# Patient Record
Sex: Male | Born: 1950 | Race: White | Hispanic: No | Marital: Married | Smoking: Former smoker
Health system: Southern US, Community
[De-identification: ages and names within clinical notes are randomized; demographics above are authoritative.]

## PROBLEM LIST (undated history)

## (undated) DIAGNOSIS — T7840XA Allergy, unspecified, initial encounter: Secondary | ICD-10-CM

## (undated) DIAGNOSIS — I219 Acute myocardial infarction, unspecified: Secondary | ICD-10-CM

## (undated) DIAGNOSIS — Z87442 Personal history of urinary calculi: Secondary | ICD-10-CM

## (undated) DIAGNOSIS — R7301 Impaired fasting glucose: Secondary | ICD-10-CM

## (undated) DIAGNOSIS — B001 Herpesviral vesicular dermatitis: Secondary | ICD-10-CM

## (undated) DIAGNOSIS — N200 Calculus of kidney: Secondary | ICD-10-CM

## (undated) DIAGNOSIS — N529 Male erectile dysfunction, unspecified: Secondary | ICD-10-CM

## (undated) DIAGNOSIS — I10 Essential (primary) hypertension: Secondary | ICD-10-CM

## (undated) DIAGNOSIS — E785 Hyperlipidemia, unspecified: Secondary | ICD-10-CM

## (undated) DIAGNOSIS — B029 Zoster without complications: Secondary | ICD-10-CM

## (undated) DIAGNOSIS — H269 Unspecified cataract: Secondary | ICD-10-CM

## (undated) DIAGNOSIS — K219 Gastro-esophageal reflux disease without esophagitis: Secondary | ICD-10-CM

## (undated) DIAGNOSIS — M1990 Unspecified osteoarthritis, unspecified site: Secondary | ICD-10-CM

## (undated) DIAGNOSIS — H9190 Unspecified hearing loss, unspecified ear: Secondary | ICD-10-CM

## (undated) DIAGNOSIS — N182 Chronic kidney disease, stage 2 (mild): Secondary | ICD-10-CM

## (undated) HISTORY — DX: Personal history of urinary calculi: Z87.442

## (undated) HISTORY — PX: COLONOSCOPY: SHX174

## (undated) HISTORY — DX: Allergy, unspecified, initial encounter: T78.40XA

## (undated) HISTORY — DX: Essential (primary) hypertension: I10

## (undated) HISTORY — DX: Unspecified cataract: H26.9

## (undated) HISTORY — DX: Hyperlipidemia, unspecified: E78.5

## (undated) HISTORY — DX: Impaired fasting glucose: R73.01

## (undated) HISTORY — DX: Male erectile dysfunction, unspecified: N52.9

## (undated) HISTORY — DX: Herpesviral vesicular dermatitis: B00.1

## (undated) HISTORY — DX: Unspecified osteoarthritis, unspecified site: M19.90

## (undated) HISTORY — DX: Chronic kidney disease, stage 2 (mild): N18.2

## (undated) HISTORY — DX: Calculus of kidney: N20.0

## (undated) HISTORY — DX: Zoster without complications: B02.9

## (undated) HISTORY — DX: Unspecified hearing loss, unspecified ear: H91.90

## (undated) HISTORY — DX: Gastro-esophageal reflux disease without esophagitis: K21.9

## (undated) HISTORY — PX: LITHOTRIPSY: SUR834

## (undated) HISTORY — DX: Acute myocardial infarction, unspecified: I21.9

---

## 2009-06-13 ENCOUNTER — Emergency Department (HOSPITAL_BASED_OUTPATIENT_CLINIC_OR_DEPARTMENT_OTHER): Admission: EM | Admit: 2009-06-13 | Discharge: 2009-06-13 | Payer: Self-pay | Admitting: Emergency Medicine

## 2011-11-21 ENCOUNTER — Encounter: Payer: Self-pay | Admitting: Internal Medicine

## 2011-11-21 ENCOUNTER — Telehealth: Payer: Self-pay | Admitting: Internal Medicine

## 2011-11-21 ENCOUNTER — Ambulatory Visit (INDEPENDENT_AMBULATORY_CARE_PROVIDER_SITE_OTHER): Payer: PRIVATE HEALTH INSURANCE | Admitting: Internal Medicine

## 2011-11-21 DIAGNOSIS — N200 Calculus of kidney: Secondary | ICD-10-CM

## 2011-11-21 DIAGNOSIS — I10 Essential (primary) hypertension: Secondary | ICD-10-CM

## 2011-11-21 DIAGNOSIS — R635 Abnormal weight gain: Secondary | ICD-10-CM

## 2011-11-21 DIAGNOSIS — H6120 Impacted cerumen, unspecified ear: Secondary | ICD-10-CM | POA: Insufficient documentation

## 2011-11-21 DIAGNOSIS — E785 Hyperlipidemia, unspecified: Secondary | ICD-10-CM

## 2011-11-21 DIAGNOSIS — Z79899 Other long term (current) drug therapy: Secondary | ICD-10-CM

## 2011-11-21 HISTORY — DX: Essential (primary) hypertension: I10

## 2011-11-21 MED ORDER — ATENOLOL-CHLORTHALIDONE 50-25 MG PO TABS: 1.0000 | ORAL_TABLET | Freq: Every day | ORAL | Status: DC

## 2011-11-21 NOTE — Patient Instructions (Signed)
Please schedule lipid/lft 272.4, tsh (wt gain), cbc 401.9 and chem7 v58.69 prior to next visit

## 2011-11-21 NOTE — Progress Notes (Signed)
Subjective:    Patient ID: Leroy Proctor, male    DOB: 05-21-1951, 60 y.o.   MRN: [id number]  HPI Pt presents to clinic to establish care and for follow up of multiple medical problems. H/o HTN currently well controlled since change to tenorectic. No side effects from medication. Remote h/o kidney stones without pain/attack for many years. Takes zocor 40mg  qd and has improved chol with diet/exercise. Also notes onset of myalgias often after exercise that began after starting the zocor. Is interested in possible dc of medication. Myalgias not typically at rest but occur with exercise that did not previously cause the pain. No known h/o abn lft's. Notes decreased hearing chronically. No other complaints.  Past Medical History  Diagnosis Date  . Hypertension   . Hyperlipidemia    No past surgical history on file.  reports that he has quit smoking. He has never used smokeless tobacco. He reports that he drinks alcohol. He reports that he does not use illicit drugs. family history is not on file. Allergies  Allergen Reactions  . Demerol     vomiting     Review of Systems  HENT: Positive for hearing loss.   Musculoskeletal: Positive for myalgias.  All other systems reviewed and are negative.       Objective:   Physical Exam  Nursing note and vitals reviewed. Constitutional: He appears well-developed and well-nourished. No distress.  HENT:  Head: Normocephalic and atraumatic.  Right Ear: External ear normal.  Left Ear: External ear normal.  Nose: Nose normal.  Mouth/Throat: Oropharynx is clear and moist. No oropharyngeal exudate.       Bilateral cerumen impaction  Eyes: Conjunctivae are normal. Right eye exhibits no discharge. Left eye exhibits no discharge. No scleral icterus.  Neck: Neck supple. Carotid bruit is not present.  Cardiovascular: Normal rate and regular rhythm.  Exam reveals no gallop and no friction rub.   No murmur heard. Pulmonary/Chest: Effort normal and breath  sounds normal. No respiratory distress. He has no wheezes. He has no rales.  Neurological: He is alert.  Skin: Skin is warm and dry. He is not diaphoretic.  Psychiatric: He has a normal mood and affect.    Physical Exam  Nursing note and vitals reviewed. Constitutional: Appears well-developed and well-nourished. No distress.  HENT:  Head: Normocephalic and atraumatic.  Right Ear: External ear normal.  Left Ear: External ear normal.  Eyes: Conjunctivae are normal. No scleral icterus.  Neck: Neck supple. Carotid bruit is not present.  Cardiovascular: Normal rate, regular rhythm and normal heart sounds.  Exam reveals no gallop and no friction rub.   No murmur heard. Pulmonary/Chest: Effort normal and breath sounds normal. No respiratory distress. He has no wheezes. no rales.  Lymphadenopathy:    He has no cervical adenopathy.  Neurological:Alert.  Skin: Skin is warm and dry. Not diaphoretic.  Psychiatric: Has a normal mood and affect.        Assessment & Plan:

## 2011-11-21 NOTE — Telephone Encounter (Signed)
Lab orders entered for March of 2013. 

## 2011-11-21 NOTE — Assessment & Plan Note (Signed)
Normotensive and stable. Continue current regimen. Monitor bp as outpt and followup in clinic as scheduled. Obtain cbc and chem7 prior to next visit

## 2011-11-21 NOTE — Assessment & Plan Note (Signed)
Stop zocor. ?related myalgias. Focus on low fat diet and regular exercise. Repeat lipid/lft prior to next visit. If suboptimal consider alternative statin.

## 2011-11-21 NOTE — Assessment & Plan Note (Signed)
Attempt bilateral irrigation. 

## 2011-12-16 ENCOUNTER — Ambulatory Visit (INDEPENDENT_AMBULATORY_CARE_PROVIDER_SITE_OTHER): Payer: PRIVATE HEALTH INSURANCE | Admitting: Internal Medicine

## 2011-12-16 DIAGNOSIS — Z23 Encounter for immunization: Secondary | ICD-10-CM

## 2011-12-17 NOTE — Progress Notes (Signed)
Patient ID: Leroy Proctor, male   DOB: 24-Jan-1951, 61 y.o.   MRN: [id number] .

## 2012-02-14 ENCOUNTER — Other Ambulatory Visit: Payer: Self-pay | Admitting: *Deleted

## 2012-02-14 DIAGNOSIS — R635 Abnormal weight gain: Secondary | ICD-10-CM

## 2012-02-14 DIAGNOSIS — E785 Hyperlipidemia, unspecified: Secondary | ICD-10-CM

## 2012-02-14 DIAGNOSIS — Z79899 Other long term (current) drug therapy: Secondary | ICD-10-CM

## 2012-02-14 DIAGNOSIS — I10 Essential (primary) hypertension: Secondary | ICD-10-CM

## 2012-02-15 LAB — CBC
HCT: 42.3 % (ref 39.0–52.0)
Hemoglobin: 13.9 g/dL (ref 13.0–17.0)
MCH: 28.3 pg (ref 26.0–34.0)
MCHC: 32.9 g/dL (ref 30.0–36.0)
MCV: 86.2 fL (ref 78.0–100.0)
RDW: 12.5 % (ref 11.5–15.5)

## 2012-02-15 LAB — LIPID PANEL
Cholesterol: 241 mg/dL — ABNORMAL HIGH (ref 0–200)
Triglycerides: 99 mg/dL (ref <150)
VLDL: 20 mg/dL (ref 0–40)

## 2012-02-15 LAB — BASIC METABOLIC PANEL
BUN: 23 mg/dL (ref 6–23)
Calcium: 9.2 mg/dL (ref 8.4–10.5)
Creat: 1.09 mg/dL (ref 0.50–1.35)
Glucose, Bld: 114 mg/dL — ABNORMAL HIGH (ref 70–99)

## 2012-02-15 LAB — HEPATIC FUNCTION PANEL
ALT: 18 U/L (ref 0–53)
AST: 18 U/L (ref 0–37)
Bilirubin, Direct: 0.1 mg/dL (ref 0.0–0.3)

## 2012-02-15 LAB — TSH: TSH: 1.078 u[IU]/mL (ref 0.350–4.500)

## 2012-02-21 ENCOUNTER — Ambulatory Visit: Payer: PRIVATE HEALTH INSURANCE | Admitting: Internal Medicine

## 2012-02-23 ENCOUNTER — Encounter: Payer: Self-pay | Admitting: Internal Medicine

## 2012-02-23 ENCOUNTER — Telehealth: Payer: Self-pay | Admitting: Internal Medicine

## 2012-02-23 ENCOUNTER — Ambulatory Visit (INDEPENDENT_AMBULATORY_CARE_PROVIDER_SITE_OTHER): Payer: PRIVATE HEALTH INSURANCE | Admitting: Internal Medicine

## 2012-02-23 VITALS — BP 120/70 | HR 43 | Temp 97.5°F | Resp 16 | Wt 197.0 lb

## 2012-02-23 DIAGNOSIS — M7660 Achilles tendinitis, unspecified leg: Secondary | ICD-10-CM

## 2012-02-23 DIAGNOSIS — R739 Hyperglycemia, unspecified: Secondary | ICD-10-CM

## 2012-02-23 DIAGNOSIS — E785 Hyperlipidemia, unspecified: Secondary | ICD-10-CM

## 2012-02-23 DIAGNOSIS — R7309 Other abnormal glucose: Secondary | ICD-10-CM

## 2012-02-23 DIAGNOSIS — I10 Essential (primary) hypertension: Secondary | ICD-10-CM

## 2012-02-23 DIAGNOSIS — Z79899 Other long term (current) drug therapy: Secondary | ICD-10-CM

## 2012-02-23 MED ORDER — ATORVASTATIN CALCIUM 20 MG PO TABS: 20.0000 mg | ORAL_TABLET | Freq: Every day | ORAL | Status: DC

## 2012-02-23 NOTE — Patient Instructions (Signed)
Please schedule chem7, a1c-hyperglycemia and lipid/lft 272.4 in ~6 wks Also schedule chem7-v58.69 and lipid/lft 272.4 prior to next visit

## 2012-02-23 NOTE — Telephone Encounter (Signed)
Future lab orders placed for the weeks of 04/02/12 and 08/20/12 and copies forwarded to the lab.

## 2012-02-23 NOTE — Progress Notes (Signed)
Subjective:    Patient ID: Leroy Proctor, male    DOB: 1951-08-21, 61 y.o.   MRN: [id number]  HPI Pt presents to clinic for followup of multiple medical problems. BP reviewed normotensive. Remains off zocor with no change in leg discomfort. Reviewed elevated ldl. Specifies sometimes notes pain located along achilles tendon but with FROM and no injury/trauma. Not currently taking medication for the problem.  Past Medical History  Diagnosis Date  . Hypertension   . Hyperlipidemia    No past surgical history on file.  reports that he has quit smoking. He has never used smokeless tobacco. He reports that he drinks alcohol. He reports that he does not use illicit drugs. family history is not on file. Allergies  Allergen Reactions  . Demerol     vomiting      Review of Systems see hpi     Objective:   Physical Exam  Physical Exam  Nursing note and vitals reviewed. Constitutional: Appears well-developed and well-nourished. No distress.  HENT:  Head: Normocephalic and atraumatic.  Right Ear: External ear normal.  Left Ear: External ear normal.  Eyes: Conjunctivae are normal. No scleral icterus.  Neck: Neck supple. Carotid bruit is not present.  Cardiovascular: Normal rate, regular rhythm and normal heart sounds.  Exam reveals no gallop and no friction rub.   No murmur heard. Pulmonary/Chest: Effort normal and breath sounds normal. No respiratory distress. He has no wheezes. no rales.  Lymphadenopathy:    He has no cervical adenopathy.  Neurological:Alert.  Skin: Skin is warm and dry. Not diaphoretic.  Psychiatric: Has a normal mood and affect.  MSK: FROM bilateral achilles tendon. NT. Gait nl       Assessment & Plan:

## 2012-02-26 DIAGNOSIS — M7660 Achilles tendinitis, unspecified leg: Secondary | ICD-10-CM | POA: Insufficient documentation

## 2012-02-26 NOTE — Assessment & Plan Note (Signed)
Normotensive and stable. Continue current regimen. Monitor bp as outpt and followup in clinic as scheduled.  

## 2012-02-26 NOTE — Assessment & Plan Note (Signed)
Off zocor. Attempt lipitor 20mg  qd. Obtain lipid/lft with prior to next visit

## 2012-02-26 NOTE — Assessment & Plan Note (Signed)
Discussed voltaren prn and currently defers. Followup if no improvement or worsening.

## 2012-07-01 ENCOUNTER — Other Ambulatory Visit: Payer: Self-pay | Admitting: Internal Medicine

## 2012-08-27 ENCOUNTER — Ambulatory Visit (INDEPENDENT_AMBULATORY_CARE_PROVIDER_SITE_OTHER): Payer: PRIVATE HEALTH INSURANCE | Admitting: Internal Medicine

## 2012-08-27 ENCOUNTER — Encounter: Payer: Self-pay | Admitting: Internal Medicine

## 2012-08-27 VITALS — BP 118/72 | HR 46 | Temp 97.8°F | Resp 16 | Ht 68.0 in | Wt 195.5 lb

## 2012-08-27 DIAGNOSIS — N529 Male erectile dysfunction, unspecified: Secondary | ICD-10-CM | POA: Insufficient documentation

## 2012-08-27 DIAGNOSIS — E785 Hyperlipidemia, unspecified: Secondary | ICD-10-CM

## 2012-08-27 DIAGNOSIS — I10 Essential (primary) hypertension: Secondary | ICD-10-CM

## 2012-08-27 DIAGNOSIS — R739 Hyperglycemia, unspecified: Secondary | ICD-10-CM

## 2012-08-27 DIAGNOSIS — R7309 Other abnormal glucose: Secondary | ICD-10-CM

## 2012-08-27 DIAGNOSIS — Z79899 Other long term (current) drug therapy: Secondary | ICD-10-CM

## 2012-08-27 MED ORDER — VARDENAFIL HCL 20 MG PO TABS: 20.0000 mg | ORAL_TABLET | Freq: Every day | ORAL | Status: DC | PRN

## 2012-08-27 MED ORDER — ATENOLOL-CHLORTHALIDONE 50-25 MG PO TABS: 1.0000 | ORAL_TABLET | Freq: Every day | ORAL | Status: DC

## 2012-08-27 MED ORDER — ATORVASTATIN CALCIUM 20 MG PO TABS: 20.0000 mg | ORAL_TABLET | Freq: Every day | ORAL | Status: DC

## 2012-08-27 NOTE — Assessment & Plan Note (Signed)
Obtain fasting lipid profile and liver function tests. 

## 2012-08-27 NOTE — Assessment & Plan Note (Signed)
Normotensive and stable. Continue current regimen. Monitor bp as outpt and followup in clinic as scheduled. Obtain Chem-7

## 2012-08-27 NOTE — Patient Instructions (Signed)
Please schedule fasting labs prior to next visit Cbc-401.9, chem7-v58.69 and lipid/lft-272.4 

## 2012-08-27 NOTE — Progress Notes (Signed)
Subjective:    Patient ID: Leroy Proctor, male    DOB: [birth year], 61 y.o.   MRN: [id number]  HPI Pt presents to clinic for followup of multiple medical problems. Complains erectile dysfunction with intact libido. Viagra previously caused headache. Not currently fasting for labs. Blood pressure reviewed is normotensive in weight down 2 pounds since last visit.  Past Medical History  Diagnosis Date  . Hypertension   . Hyperlipidemia    No past surgical history on file.  reports that he has quit smoking. He has never used smokeless tobacco. He reports that he drinks alcohol. He reports that he does not use illicit drugs. family history is not on file. Allergies  Allergen Reactions  . Demerol     vomiting      Review of Systems see hpi     Objective:   Physical Exam  Physical Exam  Nursing note and vitals reviewed. Constitutional: Appears well-developed and well-nourished. No distress.  HENT:  Head: Normocephalic and atraumatic.  Right Ear: External ear normal.  Left Ear: External ear normal.  Eyes: Conjunctivae are normal. No scleral icterus.  Neck: Neck supple. Carotid bruit is not present.  Cardiovascular: Normal rate, regular rhythm and normal heart sounds.  Exam reveals no gallop and no friction rub.   No murmur heard. Pulmonary/Chest: Effort normal and breath sounds normal. No respiratory distress. He has no wheezes. no rales.  Lymphadenopathy:    He has no cervical adenopathy.  Neurological:Alert.  Skin: Skin is warm and dry. Not diaphoretic.  Psychiatric: Has a normal mood and affect.        Assessment & Plan:

## 2012-08-27 NOTE — Assessment & Plan Note (Signed)
Obtain early a.m. testosterone. Attempt Levitra when necessary.

## 2012-09-03 LAB — LIPID PANEL
Cholesterol: 159 mg/dL (ref 0–200)
HDL: 38 mg/dL — ABNORMAL LOW (ref >39)
Total CHOL/HDL Ratio: 4.2 Ratio
Triglycerides: 81 mg/dL (ref <150)

## 2012-09-03 LAB — HEMOGLOBIN A1C: Hgb A1c MFr Bld: 5.8 % — ABNORMAL HIGH (ref <5.7)

## 2012-09-03 LAB — HEPATIC FUNCTION PANEL
ALT: 18 U/L (ref 0–53)
AST: 20 U/L (ref 0–37)
Albumin: 3.9 g/dL (ref 3.5–5.2)
Total Protein: 6.8 g/dL (ref 6.0–8.3)

## 2012-09-03 LAB — BASIC METABOLIC PANEL
Calcium: 8.9 mg/dL (ref 8.4–10.5)
Sodium: 138 mEq/L (ref 135–145)

## 2012-09-03 NOTE — Addendum Note (Signed)
Addended by: Regis Bill on: 09/03/2012 10:04 AM   Modules accepted: Orders

## 2012-09-03 NOTE — Addendum Note (Signed)
Addended by: Regis Bill on: 09/03/2012 10:05 AM   Modules accepted: Orders

## 2012-12-10 ENCOUNTER — Ambulatory Visit (INDEPENDENT_AMBULATORY_CARE_PROVIDER_SITE_OTHER): Payer: PRIVATE HEALTH INSURANCE | Admitting: Family

## 2012-12-10 ENCOUNTER — Encounter: Payer: Self-pay | Admitting: Family

## 2012-12-10 VITALS — BP 114/78 | HR 47 | Temp 97.7°F | Resp 16 | Ht 68.0 in | Wt 201.1 lb

## 2012-12-10 DIAGNOSIS — J40 Bronchitis, not specified as acute or chronic: Secondary | ICD-10-CM

## 2012-12-10 MED ORDER — HYDROCOD POLST-CHLORPHEN POLST 10-8 MG/5ML PO LQCR: 5.0000 mL | Freq: Every evening | ORAL | Status: DC | PRN

## 2012-12-10 MED ORDER — AZITHROMYCIN 250 MG PO TABS: ORAL_TABLET | ORAL | Status: DC

## 2012-12-10 NOTE — Assessment & Plan Note (Signed)
Will rx with zithromax and add tussionex HS to help with HS cough.  Pt is instructed to call if symptoms worsen or if symptoms do not continue to improve.

## 2012-12-10 NOTE — Progress Notes (Signed)
Subjective:    Patient ID: Leroy Proctor, male    DOB: 1951-05-07, 62 y.o.   MRN: [id number]  HPI  Ms.  Proctor is a 62 yr old male who presents today with chief complaint of cough. Pt reports that his cough started on 12/31.  Developed "head cold."  Has had persistent quit cough which "tore me up."  Reports that his cough continued.  Cough is worse with laying down.  Last night he "coughed all night and did not sleep."  He denies known fever.   Has "felt feverish."  Has tried some OTC preps for congestion which did not help.    Review of Systems    see HPI  Past Medical History  Diagnosis Date  . Hypertension   . Hyperlipidemia     History   Social History  . Marital Status: Married    Spouse Name: N/A    Number of Children: N/A  . Years of Education: N/A   Occupational History  . Not on file.   Social History Main Topics  . Smoking status: Former Games developer  . Smokeless tobacco: Never Used  . Alcohol Use: Yes  . Drug Use: No  . Sexually Active: Not on file   Other Topics Concern  . Not on file   Social History Narrative  . No narrative on file    No past surgical history on file.  No family history on file.  Allergies  Allergen Reactions  . Demerol     vomiting    Current Outpatient Prescriptions on File Prior to Visit  Medication Sig Dispense Refill  . atenolol-chlorthalidone (TENORETIC) 50-25 MG per tablet Take 1 tablet by mouth daily.  30 tablet  6  . atorvastatin (LIPITOR) 20 MG tablet Take 1 tablet (20 mg total) by mouth daily.  30 tablet  6  . vardenafil (LEVITRA) 20 MG tablet Take 1 tablet (20 mg total) by mouth daily as needed for erectile dysfunction.  10 tablet  3    BP 114/78  Pulse 47  Temp 97.7 F (36.5 C) (Oral)  Resp 16  Ht 5\' 8"  (1.727 m)  Wt 201 lb 1.9 oz (91.227 kg)  BMI 30.58 kg/m2  SpO2 97%    Objective:   Physical Exam  Constitutional: He is oriented to person, place, and time. He appears well-developed and well-nourished.  No distress.  HENT:  Head: Normocephalic and atraumatic.  Right Ear: Tympanic membrane and ear canal normal.  Left Ear: Tympanic membrane and ear canal normal.  Mouth/Throat: No posterior oropharyngeal edema or posterior oropharyngeal erythema.  Cardiovascular: Normal rate and regular rhythm.   No murmur heard. Pulmonary/Chest: Effort normal and breath sounds normal. No respiratory distress. He has no wheezes. He has no rales. He exhibits no tenderness.  Musculoskeletal: He exhibits no edema.  Neurological: He is alert and oriented to person, place, and time.  Skin: Skin is warm.  Psychiatric: He has a normal mood and affect. His behavior is normal. Judgment and thought content normal.          Assessment & Plan:

## 2012-12-10 NOTE — Patient Instructions (Addendum)
Please call if symptoms worsen or if not improved in 2-3 days.   

## 2013-04-03 ENCOUNTER — Telehealth: Payer: Self-pay | Admitting: Internal Medicine

## 2013-04-03 MED ORDER — ATORVASTATIN CALCIUM 20 MG PO TABS: 20.0000 mg | ORAL_TABLET | Freq: Every day | ORAL | Status: DC

## 2013-04-03 NOTE — Telephone Encounter (Signed)
Refill- atorvastatin calcium 20mg  tab. Take one tablet by mouth daily. Qty 30 last fill 4.18.14

## 2013-04-07 ENCOUNTER — Other Ambulatory Visit: Payer: Self-pay | Admitting: Internal Medicine

## 2013-04-08 ENCOUNTER — Telehealth: Payer: Self-pay

## 2013-04-08 NOTE — Telephone Encounter (Signed)
Rx sent in to pharmacy. 

## 2013-04-08 NOTE — Telephone Encounter (Signed)
Pt called stating he needs his BP medication refilled. I see that it was already done today but I also called and left the RX on [employer] vm due to it saying they were on lunch until 2:30 pm.   I informed pt

## 2013-06-26 ENCOUNTER — Telehealth: Payer: Self-pay

## 2013-06-26 NOTE — Telephone Encounter (Signed)
pts wife Okey Regal called stating that her spouse came up to pick up his records and on his visit from 12-10-12 it has all the wifes information instead of the patients?   pts wife was concerned because the visit summary says Ms and male .

## 2013-07-02 NOTE — Telephone Encounter (Signed)
I left a message for spouse to return my call to see if there were any other concerns but I haven't got a call back.

## 2013-07-03 NOTE — Telephone Encounter (Addendum)
Please let pt's wife know that I reviewed his chart and the notation of demerol allergy dates back to a 2010 ER visit.  I would prefer that we try to avoid future use of demerol to be on the safe side.  Also, "ms" was a typo- my apologies.

## 2013-07-03 NOTE — Telephone Encounter (Signed)
Spoke with pt's wife re: concern. She states pt's chart has documentation of allergy to Demerol and she is not aware that pt has ever taken Demerol before however she has a documented demerol allergy. She is also concerned that pt's office visit from 12/2012 references pt as "Ms". Upon review of note it appears that may be a typo as the remainer of the paragraph refers to pt as "he/him/his".   I advised pt's wife of this but she remains concerned.  She did verify that pt was seen for a cough in January.  She asks that I remove Demerol from pt's allergies. I advised her that I am unable to do that without confirming / clarifying the allergy with the patient first and she became upset that I "am asking the pt to correct something that we made a mistake on".  I advised her that we ask and give patients a printout of their medication and allergy list at each visit to verify and update this information and he has not reported this prior.  This allergy was added to the pt's record in 2012 when he established with this practice. She states she will have him discuss this when he comes in to be seen again. She also wanted to let us know that pt is hard of hearing and only hears about 50% of what is said to him.

## 2013-07-03 NOTE — Telephone Encounter (Signed)
Will discuss with pt at his next appt

## 2013-09-16 ENCOUNTER — Telehealth: Payer: Self-pay | Admitting: Internal Medicine

## 2013-09-16 NOTE — Telephone Encounter (Signed)
I am happy to arrange colonoscopy.  We generally order this as part of a physical.  I see is has not had a recent physical. Please arrange cpx.

## 2013-09-16 NOTE — Telephone Encounter (Signed)
Patient states that his insurance is requesting that he have a screening colonoscopy and would like to be referred to this.

## 2013-09-18 NOTE — Telephone Encounter (Signed)
Left message on voicemail to return my call.  

## 2013-09-20 NOTE — Telephone Encounter (Signed)
Left message to return my call.  

## 2013-09-23 NOTE — Telephone Encounter (Signed)
Letter mailed to pt.  

## 2013-09-24 ENCOUNTER — Telehealth: Payer: Self-pay | Admitting: Family

## 2013-09-24 NOTE — Telephone Encounter (Signed)
His insurance is requiring him to have a colonoscopy before the first of next year.

## 2013-09-25 NOTE — Telephone Encounter (Signed)
Marj,   See 10/13 phone note.  I left 3 messages for pt and never received call back so I finally mailed him a letter. If you can reach him, he needs to schedule a physical first (as colonoscopy is usually arranged at the time of his physical) and Melissa can arrange referral to GI at that time. Thanks.

## 2013-10-02 ENCOUNTER — Encounter: Payer: Self-pay | Admitting: Family

## 2013-10-02 ENCOUNTER — Ambulatory Visit (INDEPENDENT_AMBULATORY_CARE_PROVIDER_SITE_OTHER): Payer: PRIVATE HEALTH INSURANCE | Admitting: Family

## 2013-10-02 VITALS — BP 140/82 | HR 48 | Temp 97.7°F | Resp 16 | Ht 69.0 in | Wt 194.0 lb

## 2013-10-02 DIAGNOSIS — Z136 Encounter for screening for cardiovascular disorders: Secondary | ICD-10-CM

## 2013-10-02 DIAGNOSIS — I498 Other specified cardiac arrhythmias: Secondary | ICD-10-CM

## 2013-10-02 DIAGNOSIS — R001 Bradycardia, unspecified: Secondary | ICD-10-CM

## 2013-10-02 DIAGNOSIS — Z0000 Encounter for general adult medical examination without abnormal findings: Secondary | ICD-10-CM

## 2013-10-02 DIAGNOSIS — Z23 Encounter for immunization: Secondary | ICD-10-CM

## 2013-10-02 HISTORY — DX: Bradycardia, unspecified: R00.1

## 2013-10-02 HISTORY — DX: Encounter for general adult medical examination without abnormal findings: Z00.00

## 2013-10-02 MED ORDER — AMLODIPINE BESYLATE 5 MG PO TABS: 5.0000 mg | ORAL_TABLET | Freq: Every day | ORAL | Status: DC

## 2013-10-02 MED ORDER — ATENOLOL-CHLORTHALIDONE 50-25 MG PO TABS: 1.0000 | ORAL_TABLET | Freq: Every day | ORAL | Status: DC

## 2013-10-02 MED ORDER — ATORVASTATIN CALCIUM 20 MG PO TABS: 20.0000 mg | ORAL_TABLET | Freq: Every day | ORAL | Status: DC

## 2013-10-02 MED ORDER — CHLORTHALIDONE 25 MG PO TABS: 25.0000 mg | ORAL_TABLET | Freq: Every day | ORAL | Status: DC

## 2013-10-02 NOTE — Assessment & Plan Note (Addendum)
Continue healthy diet, exercise.  Pt will return fasting for lab work including PSA (discussed pros/cons).

## 2013-10-02 NOTE — Patient Instructions (Addendum)
Please return fasting for lab work. You will be contacted about your referral to gastroenterology for colonoscopy. Stop Tenoretic due to low heart rate.   Start chlorthalidone and amlodipine. Follow up in 1 month for BP and heart rate check.

## 2013-10-02 NOTE — Progress Notes (Signed)
Subjective:    Patient ID: Leroy Proctor, male    DOB: [birth year], 62 y.o.   MRN: [id number]  HPI  Patient presents today for complete physical.  Immunizations: Tdap up to date.  Would like flu shot  Diet: Reports diet is healthy.  Avoids red meat and fried food.  Exercise: enjoys [employer] 5 x a week for 1 hour.  39miles/week stationary bike, rowing machine twisting etc.   Colonoscopy: due EKG: due    Review of Systems  Constitutional: Negative for unexpected weight change.  HENT: Positive for rhinorrhea.        Reports problems with his hearing "his whole life."  Declines referral due to finances  Respiratory: Negative for cough.   Gastrointestinal: Negative for vomiting, diarrhea and constipation.  Genitourinary: Negative for frequency.       Nocturia  X1.   Musculoskeletal: Negative for arthralgias and myalgias.  Skin:       Occasional dry skin  Neurological: Negative for headaches.  Hematological: Negative for adenopathy.  Psychiatric/Behavioral:       Denies depression/anxiety       Objective:   Physical Exam  Physical Exam  Constitutional: He is oriented to person, place, and time. He appears well-developed and well-nourished. No distress.  HENT:  Head: Normocephalic and atraumatic.  Right Ear: Tympanic membrane and ear canal normal.  Left Ear: Tympanic membrane and ear canal normal.  Mouth/Throat: Oropharynx is clear and moist.  Eyes: Pupils are equal, round, and reactive to light. No scleral icterus.  Neck: Normal range of motion. No thyromegaly present.  Cardiovascular: Normal rate and regular rhythm.   No murmur heard. Pulmonary/Chest: Effort normal and breath sounds normal. No respiratory distress. He has no wheezes. He has no rales. He exhibits no tenderness.  Abdominal: Soft. Bowel sounds are normal. He exhibits no distension and no mass. There is no tenderness. There is no rebound and no guarding.  Musculoskeletal: He exhibits no edema.  Lymphadenopathy:    He has no cervical adenopathy.  Neurological: He is alert and oriented to person, place, and time.  He exhibits normal muscle tone. Coordination normal.  Skin: Skin is warm and dry.  Psychiatric: He has a normal mood and affect. His behavior is normal. Judgment and thought content normal.          Assessment & Plan:         Assessment & Plan:

## 2013-10-02 NOTE — Assessment & Plan Note (Signed)
BP is stable and pt is asymptomatic, however his EKG performed in the office today notes HR of 38.  Earlier pulse was 48.  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  Will d/c pt's beta blocker and instead change to amlodipine. Continue chlorthalidone.  Follow up in 1 month for BP check, EKG and HR.  Pt verbalizes understanding.

## 2013-10-11 ENCOUNTER — Encounter: Payer: Self-pay | Admitting: Internal Medicine

## 2013-10-11 ENCOUNTER — Other Ambulatory Visit: Payer: Self-pay | Admitting: Family

## 2013-10-11 LAB — CBC WITH DIFFERENTIAL/PLATELET
Eosinophils Absolute: 0.2 10*3/uL (ref 0.0–0.7)
HCT: 43.1 % (ref 39.0–52.0)
Hemoglobin: 15 g/dL (ref 13.0–17.0)
Lymphocytes Relative: 25 % (ref 12–46)
Lymphs Abs: 1.4 10*3/uL (ref 0.7–4.0)
MCHC: 34.8 g/dL (ref 30.0–36.0)
Monocytes Relative: 10 % (ref 3–12)
Neutro Abs: 3.3 10*3/uL (ref 1.7–7.7)
Neutrophils Relative %: 60 % (ref 43–77)
Platelets: 272 10*3/uL (ref 150–400)
RBC: 5.06 MIL/uL (ref 4.22–5.81)
WBC: 5.5 10*3/uL (ref 4.0–10.5)

## 2013-10-11 LAB — LIPID PANEL
Cholesterol: 163 mg/dL (ref 0–200)
LDL Cholesterol: 110 mg/dL — ABNORMAL HIGH (ref 0–99)
Triglycerides: 54 mg/dL (ref <150)
VLDL: 11 mg/dL (ref 0–40)

## 2013-10-11 LAB — TSH: TSH: 1.083 u[IU]/mL (ref 0.350–4.500)

## 2013-10-11 LAB — BASIC METABOLIC PANEL WITH GFR
BUN: 24 mg/dL — ABNORMAL HIGH (ref 6–23)
CO2: 29 mEq/L (ref 19–32)
Calcium: 9.1 mg/dL (ref 8.4–10.5)
Chloride: 101 mEq/L (ref 96–112)
GFR, Est African American: 82 mL/min
Glucose, Bld: 98 mg/dL (ref 70–99)
Potassium: 4.2 mEq/L (ref 3.5–5.3)
Sodium: 134 mEq/L — ABNORMAL LOW (ref 135–145)

## 2013-10-11 LAB — HEPATIC FUNCTION PANEL
ALT: 23 U/L (ref 0–53)
AST: 23 U/L (ref 0–37)
Albumin: 4 g/dL (ref 3.5–5.2)
Alkaline Phosphatase: 58 U/L (ref 39–117)
Bilirubin, Direct: 0.1 mg/dL (ref 0.0–0.3)
Indirect Bilirubin: 0.5 mg/dL (ref 0.0–0.9)
Total Bilirubin: 0.6 mg/dL (ref 0.3–1.2)
Total Protein: 7.2 g/dL (ref 6.0–8.3)

## 2013-10-12 LAB — URINALYSIS, ROUTINE W REFLEX MICROSCOPIC
Glucose, UA: NEGATIVE mg/dL
Hgb urine dipstick: NEGATIVE
Leukocytes, UA: NEGATIVE
Nitrite: NEGATIVE
Urobilinogen, UA: 0.2 mg/dL (ref 0.0–1.0)
pH: 7 (ref 5.0–8.0)

## 2013-10-12 LAB — PSA: PSA: 0.33 ng/mL (ref <=4.00)

## 2013-10-14 ENCOUNTER — Encounter: Payer: Self-pay | Admitting: Family

## 2013-11-11 ENCOUNTER — Encounter: Payer: Self-pay | Admitting: Internal Medicine

## 2013-11-11 ENCOUNTER — Ambulatory Visit (AMBULATORY_SURGERY_CENTER): Payer: Self-pay | Admitting: *Deleted

## 2013-11-11 VITALS — Ht 69.0 in | Wt 203.0 lb

## 2013-11-11 DIAGNOSIS — Z1211 Encounter for screening for malignant neoplasm of colon: Secondary | ICD-10-CM

## 2013-11-11 MED ORDER — NA SULFATE-K SULFATE-MG SULF 17.5-3.13-1.6 GM/177ML PO SOLN: ORAL | Status: DC

## 2013-11-11 NOTE — Progress Notes (Signed)
Patient denies any allergies to eggs or soy. 

## 2013-11-21 ENCOUNTER — Ambulatory Visit (AMBULATORY_SURGERY_CENTER): Payer: PRIVATE HEALTH INSURANCE | Admitting: Internal Medicine

## 2013-11-21 ENCOUNTER — Encounter: Payer: Self-pay | Admitting: Internal Medicine

## 2013-11-21 VITALS — BP 134/75 | HR 54 | Temp 97.1°F | Resp 15 | Ht 69.0 in | Wt 203.0 lb

## 2013-11-21 DIAGNOSIS — D126 Benign neoplasm of colon, unspecified: Secondary | ICD-10-CM

## 2013-11-21 DIAGNOSIS — Z1211 Encounter for screening for malignant neoplasm of colon: Secondary | ICD-10-CM

## 2013-11-21 DIAGNOSIS — D128 Benign neoplasm of rectum: Secondary | ICD-10-CM

## 2013-11-21 MED ORDER — SODIUM CHLORIDE 0.9 % IV SOLN: 500.0000 mL | INTRAVENOUS | Status: DC

## 2013-11-21 NOTE — Progress Notes (Signed)
Lidocaine-40mg IV prior to Propofol InductionPropofol given over incremental dosages 

## 2013-11-21 NOTE — Patient Instructions (Addendum)
I found and removed 2 tiny polyps - otherwise ok.  I will let you know pathology results and when to have another routine colonoscopy by mail. I appreciate the opportunity to care for you. Iva Boop, MD, FACG  YOU HAD AN ENDOSCOPIC PROCEDURE TODAY AT THE [facility]: Refer to the procedure report that was given to you for any specific questions about what was found during the examination.  If the procedure report does not answer your questions, please call your gastroenterologist to clarify.  If you requested that your care partner not be given the details of your procedure findings, then the procedure report has been included in a sealed envelope for you to review at your convenience later.  YOU SHOULD EXPECT: Some feelings of bloating in the abdomen. Passage of more gas than usual.  Walking can help get rid of the air that was put into your GI tract during the procedure and reduce the bloating. If you had a lower endoscopy (such as a colonoscopy or flexible sigmoidoscopy) you may notice spotting of blood in your stool or on the toilet paper. If you underwent a bowel prep for your procedure, then you may not have a normal bowel movement for a few days.  DIET: Your first meal following the procedure should be a light meal and then it is ok to progress to your normal diet.  A half-sandwich or bowl of soup is an example of a good first meal.  Heavy or fried foods are harder to digest and may make you feel nauseous or bloated.  Likewise meals heavy in dairy and vegetables can cause extra gas to form and this can also increase the bloating.  Drink plenty of fluids but you should avoid alcoholic beverages for 24 hours.  ACTIVITY: Your care partner should take you home directly after the procedure.  You should plan to take it easy, moving slowly for the rest of the day.  You can resume normal activity the day after the procedure however you should NOT DRIVE or use heavy machinery for 24  hours (because of the sedation medicines used during the test).    SYMPTOMS TO REPORT IMMEDIATELY: A gastroenterologist can be reached at any hour.  During normal business hours, 8:30 AM to 5:00 PM Monday through Friday, call [phone].  After hours and on weekends, please call the GI answering service at [phone] who will take a message and have the physician on call contact you.   Following lower endoscopy (colonoscopy or flexible sigmoidoscopy):  Excessive amounts of blood in the stool  Significant tenderness or worsening of abdominal pains  Swelling of the abdomen that is new, acute  Fever of 100F or higher   FOLLOW UP: If any biopsies were taken you will be contacted by phone or by letter within the next 1-3 weeks.  Call your gastroenterologist if you have not heard about the biopsies in 3 weeks.  Our staff will call the home number listed on your records the next business day following your procedure to check on you and address any questions or concerns that you may have at that time regarding the information given to you following your procedure. This is a courtesy call and so if there is no answer at the home number and we have not heard from you through the emergency physician on call, we will assume that you have returned to your regular daily activities without incident.  SIGNATURES/CONFIDENTIALITY: You and/or your care partner have  signed paperwork which will be entered into your electronic medical record.  These signatures attest to the fact that that the information above on your After Visit Summary has been reviewed and is understood.  Full responsibility of the confidentiality of this discharge information lies with you and/or your care-partner.

## 2013-11-21 NOTE — Progress Notes (Signed)
No egg or soy allergy. ewm 

## 2013-11-21 NOTE — Progress Notes (Signed)
Called to room to assist during endoscopic procedure.  Patient ID and intended procedure confirmed with present staff. Received instructions for my participation in the procedure from the performing physician.  

## 2013-11-21 NOTE — Op Note (Signed)
[facility] [address] [place], [address]   COLONOSCOPY PROCEDURE REPORT  PATIENT: Leroy Proctor, Leroy Proctor  MR#: [id number] BIRTHDATE: 1951/08/30 , 62  yrs. old GENDER: Male ENDOSCOPIST: Iva Boop, MD, [facility] REFERRED XB:[id number] Peggyann Juba, FNP PROCEDURE DATE:  11/21/2013 PROCEDURE:   Colonoscopy with biopsy First Screening Colonoscopy - Avg.  risk and is 50 yrs.  old or older - No.  Prior Negative Screening - Now for repeat screening. 10 or more years since last screening  History of Adenoma - Now for follow-up colonoscopy & has been > or = to 3 yrs.  N/A  Polyps Removed Today? Yes. ASA CLASS:   Class II INDICATIONS:average risk screening and Last colonoscopy performed 10 years ago. MEDICATIONS: propofol (Diprivan) 200mg  IV, MAC sedation, administered by CRNA, and These medications were titrated to patient response per physician's verbal order  DESCRIPTION OF PROCEDURE:   After the risks benefits and alternatives of the procedure were thoroughly explained, informed consent was obtained.  A digital rectal exam revealed no abnormalities of the rectum, A digital rectal exam revealed no prostatic nodules, and A digital rectal exam revealed the prostate was not enlarged.   The LB [id number]  endoscope was introduced through the anus and advanced to the cecum, which was identified by both the appendix and ileocecal valve. No adverse events experienced.   The quality of the prep was Suprep adequate The instrument was then slowly withdrawn as the colon was fully examined.  COLON FINDINGS: Two diminutive sessile polyps were found in the sigmoid colon and rectum.  A polypectomy was performed with cold forceps.  The resection was complete and the polyp tissue was completely retrieved.   Mild diverticulosis was noted in the sigmoid colon.   The colon mucosa was otherwise normal. Retroflexed views revealed no abnormalities. The time to cecum=2 minutes 45 seconds.   Withdrawal time=10 minutes 12 seconds.  The scope was withdrawn and the procedure completed. COMPLICATIONS: There were no complications.  ENDOSCOPIC IMPRESSION: 1.   Two diminutive sessile polyps were found in the sigmoid colon and rectum; polypectomy was performed with cold forceps 2.   Mild diverticulosis was noted in the sigmoid colon 3.   The colon mucosa was otherwise normal - adequate prep  RECOMMENDATIONS: Timing of repeat colonoscopy will be determined by pathology findings.   eSigned:  Iva Boop, MD, [facility] 11/21/2013 3:56 PM  cc: Sandford Craze FNP and The Patient

## 2013-11-22 ENCOUNTER — Telehealth: Payer: Self-pay

## 2013-11-22 NOTE — Telephone Encounter (Signed)
  Follow up Call-  Call back number 11/21/2013  Post procedure Call Back phone  # [phone]  Permission to leave phone message Yes     Patient questions:  Do you have a fever, pain , or abdominal swelling? no Pain Score  0 *  Have you tolerated food without any problems? yes  Have you been able to return to your normal activities? yes  Do you have any questions about your discharge instructions: Diet   no Medications  no Follow up visit  no  Do you have questions or concerns about your Care? no  Actions: * If pain score is 4 or above: No action needed, pain <4.

## 2013-12-01 ENCOUNTER — Encounter: Payer: Self-pay | Admitting: Internal Medicine

## 2013-12-01 NOTE — Progress Notes (Signed)
Quick Note:  Diminutive rectal and sigmoid hyperplastic polyos Repeat colonoscopy 11/2023 ______

## 2013-12-30 ENCOUNTER — Other Ambulatory Visit: Payer: Self-pay | Admitting: Family

## 2014-02-28 ENCOUNTER — Telehealth: Payer: Self-pay | Admitting: Family

## 2014-02-28 ENCOUNTER — Ambulatory Visit: Payer: PRIVATE HEALTH INSURANCE | Admitting: Family

## 2014-02-28 MED ORDER — AMLODIPINE BESYLATE 5 MG PO TABS: ORAL_TABLET | ORAL | Status: DC

## 2014-02-28 MED ORDER — ATORVASTATIN CALCIUM 20 MG PO TABS: 20.0000 mg | ORAL_TABLET | Freq: Every day | ORAL | Status: DC

## 2014-02-28 MED ORDER — CHLORTHALIDONE 25 MG PO TABS: 25.0000 mg | ORAL_TABLET | Freq: Every day | ORAL | Status: DC

## 2014-02-28 NOTE — Telephone Encounter (Signed)
AmLODIPine Besylate (Tab) NORVASC 5 MG TAKE 1 TABLET (5 MG TOTAL) BY MOUTH DAILY. Atorvastatin Calcium (Tab) LIPITOR 20 MG Take 1 tablet (20 mg total) by mouth daily. Chlorthalidone (Tab) HYGROTON 25 MG Take 1 tablet (25 mg total) by mouth  Patient needs a 1 month refill on each of the above.  His follow up appt is on 03-14-2014

## 2014-02-28 NOTE — Telephone Encounter (Signed)
Refills sent. Notified pt. 

## 2014-03-14 ENCOUNTER — Encounter: Payer: Self-pay | Admitting: Family

## 2014-03-14 ENCOUNTER — Ambulatory Visit (INDEPENDENT_AMBULATORY_CARE_PROVIDER_SITE_OTHER): Payer: PRIVATE HEALTH INSURANCE | Admitting: Family

## 2014-03-14 ENCOUNTER — Ambulatory Visit: Payer: PRIVATE HEALTH INSURANCE | Admitting: Family

## 2014-03-14 VITALS — BP 130/84 | HR 61 | Temp 98.4°F | Resp 16 | Ht 69.0 in | Wt 195.1 lb

## 2014-03-14 DIAGNOSIS — R001 Bradycardia, unspecified: Secondary | ICD-10-CM

## 2014-03-14 DIAGNOSIS — E785 Hyperlipidemia, unspecified: Secondary | ICD-10-CM

## 2014-03-14 DIAGNOSIS — I498 Other specified cardiac arrhythmias: Secondary | ICD-10-CM

## 2014-03-14 DIAGNOSIS — I10 Essential (primary) hypertension: Secondary | ICD-10-CM

## 2014-03-14 LAB — BASIC METABOLIC PANEL WITH GFR
BUN: 20 mg/dL (ref 6–23)
CO2: 26 meq/L (ref 19–32)
CREATININE: 1.01 mg/dL (ref 0.50–1.35)
Calcium: 9.4 mg/dL (ref 8.4–10.5)
Chloride: 102 mEq/L (ref 96–112)
GFR, Est African American: >89 mL/min
GFR, Est Non African American: 79 mL/min
Glucose, Bld: 107 mg/dL — ABNORMAL HIGH (ref 70–99)
POTASSIUM: 4 meq/L (ref 3.5–5.3)
Sodium: 137 mEq/L (ref 135–145)

## 2014-03-14 LAB — HEPATIC FUNCTION PANEL
ALBUMIN: 3.9 g/dL (ref 3.5–5.2)
ALK PHOS: 62 U/L (ref 39–117)
ALT: 27 U/L (ref 0–53)
AST: 25 U/L (ref 0–37)
BILIRUBIN INDIRECT: 0.5 mg/dL (ref 0.2–1.2)
Bilirubin, Direct: 0.1 mg/dL (ref 0.0–0.3)
Total Bilirubin: 0.6 mg/dL (ref 0.2–1.2)
Total Protein: 7 g/dL (ref 6.0–8.3)

## 2014-03-14 MED ORDER — AMLODIPINE BESYLATE 5 MG PO TABS: ORAL_TABLET | ORAL | Status: DC

## 2014-03-14 MED ORDER — ATORVASTATIN CALCIUM 20 MG PO TABS
20.0000 mg | ORAL_TABLET | Freq: Every day | ORAL | Status: DC
Start: 2014-03-14 — End: 2014-08-18

## 2014-03-14 MED ORDER — VARDENAFIL HCL 20 MG PO TABS: 20.0000 mg | ORAL_TABLET | Freq: Every day | ORAL | Status: DC | PRN

## 2014-03-14 MED ORDER — CHLORTHALIDONE 25 MG PO TABS: 25.0000 mg | ORAL_TABLET | Freq: Every day | ORAL | Status: DC

## 2014-03-14 NOTE — Progress Notes (Signed)
Pre visit review using our clinic review tool, if applicable. No additional management support is needed unless otherwise documented below in the visit note. 

## 2014-03-14 NOTE — Assessment & Plan Note (Signed)
BP is improved off of beta blocker.  Monitor.

## 2014-03-14 NOTE — Patient Instructions (Signed)
Please complete your lab work prior to leaving. Follow up in 4 months.

## 2014-03-14 NOTE — Assessment & Plan Note (Signed)
BP stable on amlodipine and chlorthalidone, continue same.

## 2014-03-14 NOTE — Progress Notes (Signed)
Subjective:    Patient ID: Leroy Proctor, male    DOB: [birth year], 63 y.o.   MRN: [id number]  HPI  Ms. Gregorich is a 63 yr old male who presents today for follow up.  1) HTN-  Denies CP/SOB/swelling or palpitations. BP Readings from Last 3 Encounters:  03/14/14 130/84  11/21/13 134/75  10/02/13 140/82    2) Bradycardia- last visit he was noted to have HR of 38.  His beta blocker was discontinued and he was placed on amlodipine in addition to chlorthalidone. HR today is 61.     Review of Systems See HPI  Past Medical History  Diagnosis Date  . Hypertension   . Hyperlipidemia   . Personal history of kidney stones     History   Social History  . Marital Status: Married    Spouse Name: N/A    Number of Children: N/A  . Years of Education: N/A   Occupational History  . Not on file.   Social History Main Topics  . Smoking status: Former Games developer  . Smokeless tobacco: Never Used  . Alcohol Use: 1.2 oz/week    1 Glasses of wine, 1 Cans of beer per week  . Drug Use: No  . Sexual Activity: Not on file   Other Topics Concern  . Not on file   Social History Narrative  . No narrative on file    Past Surgical History  Procedure Laterality Date  . Lithotripsy    . Colonoscopy  12 years ago    in [facility] [address]    Family History  Problem Relation Age of Onset  . Colon cancer Neg Hx     Allergies  Allergen Reactions  . Demerol Nausea And Vomiting    vomiting    Current Outpatient Prescriptions on File Prior to Visit  Medication Sig Dispense Refill  . amLODipine (NORVASC) 5 MG tablet TAKE 1 TABLET (5 MG TOTAL) BY MOUTH DAILY.  30 tablet  0  . atorvastatin (LIPITOR) 20 MG tablet Take 1 tablet (20 mg total) by mouth daily.  30 tablet  0  . chlorthalidone (HYGROTON) 25 MG tablet Take 1 tablet (25 mg total) by mouth daily.  30 tablet  0  . vardenafil (LEVITRA) 20 MG tablet Take 1 tablet (20 mg total) by mouth daily as needed for erectile dysfunction.  10 tablet  3    No current facility-administered medications on file prior to visit.    BP 130/84  Pulse 61  Temp(Src) 98.4 F (36.9 C) (Oral)  Resp 16  Ht 5\' 9"  (1.753 m)  Wt 195 lb 1.9 oz (88.506 kg)  BMI 28.80 kg/m2  SpO2 99%       Objective:   Physical Exam  Constitutional: He is oriented to person, place, and time. He appears well-developed and well-nourished. No distress.  Cardiovascular: Normal rate and regular rhythm.   No murmur heard. Pulmonary/Chest: Effort normal and breath sounds normal. No respiratory distress. He has no wheezes. He has no rales. He exhibits no tenderness.  Musculoskeletal: He exhibits no edema.  Neurological: He is alert and oriented to person, place, and time.  Psychiatric: He has a normal mood and affect. His behavior is normal. Judgment and thought content normal.          Assessment & Plan:

## 2014-03-16 ENCOUNTER — Encounter: Payer: Self-pay | Admitting: Family

## 2014-03-19 NOTE — Telephone Encounter (Signed)
Opened in error

## 2014-04-02 ENCOUNTER — Telehealth: Payer: Self-pay

## 2014-04-02 NOTE — Telephone Encounter (Signed)
Pt left a voicemail requesting refills on his medications. I called and left message to call back to verify which medications he needs.

## 2014-06-03 ENCOUNTER — Encounter: Payer: Self-pay | Admitting: Family

## 2014-06-03 ENCOUNTER — Ambulatory Visit (INDEPENDENT_AMBULATORY_CARE_PROVIDER_SITE_OTHER): Payer: PRIVATE HEALTH INSURANCE | Admitting: Family

## 2014-06-03 VITALS — BP 142/80 | HR 55 | Temp 97.8°F | Resp 16 | Ht 69.0 in | Wt 191.0 lb

## 2014-06-03 DIAGNOSIS — B001 Herpesviral vesicular dermatitis: Secondary | ICD-10-CM | POA: Insufficient documentation

## 2014-06-03 DIAGNOSIS — B009 Herpesviral infection, unspecified: Secondary | ICD-10-CM

## 2014-06-03 HISTORY — DX: Herpesviral vesicular dermatitis: B00.1

## 2014-06-03 MED ORDER — VALACYCLOVIR HCL 1 G PO TABS: ORAL_TABLET | ORAL | Status: DC

## 2014-06-03 NOTE — Progress Notes (Signed)
Subjective:    Patient ID: Leroy Proctor, male    DOB: Apr 16, 1951, 64 y.o.   MRN: [id number]  HPI  [name] is a 63 yr old male who presents today with chief complaint of sore on right lower lip. Reports that sore has been present x 6 weeks.  Has tried Cortisone cream, camphophenique and another otc cream without improvement.  Notes that the lesion seems to wax and wane and sometimes shifts to the left lower lip.  Area is sore. Denies previous history of cold sores.   Review of Systems See HPI  Past Medical History  Diagnosis Date  . Hypertension   . Hyperlipidemia   . Personal history of kidney stones     History   Social History  . Marital Status: Married    Spouse Name: N/A    Number of Children: N/A  . Years of Education: N/A   Occupational History  . Not on file.   Social History Main Topics  . Smoking status: Former Games developer  . Smokeless tobacco: Never Used  . Alcohol Use: 1.2 oz/week    1 Glasses of wine, 1 Cans of beer per week  . Drug Use: No  . Sexual Activity: Not on file   Other Topics Concern  . Not on file   Social History Narrative  . No narrative on file    Past Surgical History  Procedure Laterality Date  . Lithotripsy    . Colonoscopy  12 years ago    in [facility]    Family History  Problem Relation Age of Onset  . Colon cancer Neg Hx     Allergies  Allergen Reactions  . Atenolol     bradycarda- heart rate down to 38 bpm  . Demerol Nausea And Vomiting    vomiting    Current Outpatient Prescriptions on File Prior to Visit  Medication Sig Dispense Refill  . amLODipine (NORVASC) 5 MG tablet TAKE 1 TABLET (5 MG TOTAL) BY MOUTH DAILY.  30 tablet  3  . atorvastatin (LIPITOR) 20 MG tablet Take 1 tablet (20 mg total) by mouth daily.  30 tablet  3  . chlorthalidone (HYGROTON) 25 MG tablet Take 1 tablet (25 mg total) by mouth daily.  30 tablet  3  . vardenafil (LEVITRA) 20 MG tablet Take 1 tablet (20 mg total) by mouth daily as  needed for erectile dysfunction.  10 tablet  3   No current facility-administered medications on file prior to visit.    BP 142/80  Pulse 55  Temp(Src) 97.8 F (36.6 C) (Oral)  Resp 16  Ht 5\' 9"  (1.753 m)  Wt 191 lb (86.637 kg)  BMI 28.19 kg/m2  SpO2 99%       Objective:   Physical Exam  Constitutional: He is oriented to person, place, and time. He appears well-developed and well-nourished. No distress.  HENT:  Small vesicular lesions noted on right lower lip.  Neurological: He is alert and oriented to person, place, and time.  Psychiatric: He has a normal mood and affect. His behavior is normal. Judgment and thought content normal.          Assessment & Plan:

## 2014-06-03 NOTE — Progress Notes (Signed)
Pre visit review using our clinic review tool, if applicable. No additional management support is needed unless otherwise documented below in the visit note. 

## 2014-06-03 NOTE — Patient Instructions (Signed)
Cold Sore A cold sore (fever blister) is a skin infection caused by the herpes simplex virus (HSV-1). HSV-1 is closely related to the virus that causes gential herpes (HSV-2), but they are not the same even though both viruses can cause oral and genital infections. Cold sores are small, fluid-filled sores inside of the mouth or on the lips, gums, nose, chin, cheeks, or fingers.  The herpes simplex virus can be easily passed (contagious) to other people through close personal contact, such as kissing or sharing personal items. The virus can also spread to other parts of the body, such as the eyes or genitals. Cold sores are contagious until the sores crust over completely. They often heal within 2 weeks.  Once a person is infected, the herpes simplex virus remains permanently in the body. Therefore, there is no cure for cold sores, and they often recur when a person is tired, stressed, sick, or gets too much sun. Additional factors that can cause a recurrence include hormone changes in menstruation or pregnancy, certain drugs, and cold weather.  CAUSES  Cold sores are caused by the herpes simplex virus. The virus is spread from person to person through close contact, such as through kissing, touching the affected area, or sharing personal items such as lip balm, razors, or eating utensils.  SYMPTOMS  The first infection may not cause symptoms. If symptoms develop, the symptoms often go through different stages. Here is how a cold sore develops:   Tingling, itching, or burning is felt 1-2 days before the outbreak.   Fluid-filled blisters appear on the lips, inside the mouth, nose, or on the cheeks.   The blisters start to ooze clear fluid.   The blisters dry up and a yellow crust appears in its place.   The crust falls off.  Symptoms depend on whether it is the initial outbreak or a recurrence. Some other symptoms with the first outbreak may include:   Fever.   Sore throat.   Headache.    Muscle aches.   Swollen neck glands.  DIAGNOSIS  A diagnosis is often made based on your symptoms and looking at the sores. Sometimes, a sore may be swabbed and then examined in the lab to make a final diagnosis. If the sores are not present, blood tests can find the herpes simplex virus.  TREATMENT  There is no cure for cold sores and no vaccine for the herpes simplex virus. Within 2 weeks, most cold sores go away on their own without treatment. Medicines cannot make the infection go away, but medicine can help relieve some of the pain associated with the sores, can work to stop the virus from multiplying, and can also shorten healing time. Medicine may be in the form of creams, gels, pills, or a shot.  HOME CARE INSTRUCTIONS   Only take over-the-counter or prescription medicines for pain, discomfort, or fever as directed by your caregiver. Do not use aspirin.   Use a cotton-tip swab to apply creams or gels to your sores.   Do not touch the sores or pick the scabs. Wash your hands often. Do not touch your eyes without washing your hands first.   Avoid kissing, oral sex, and sharing personal items until sores heal.   Apply an ice pack on your sores for 10-15 minutes to ease any discomfort.   Avoid hot, cold, or salty foods because they may hurt your mouth. Eat a soft, bland diet to avoid irritating the sores. Use a straw to drink  if you have pain when drinking out of a glass.   Keep sores clean and dry to prevent an infection of other tissues.   Avoid the sun and limit stress if these things trigger outbreaks. If sun causes cold sores, apply sunscreen on the lips before being out in the sun.  SEEK MEDICAL CARE IF:   You have a fever or persistent symptoms for more than 2-3 days.   You have a fever and your symptoms suddenly get worse.   You have pus, not clear fluid, coming from the sores.   You have redness that is spreading.   You have pain or irritation in your  eye.   You get sores on your genitals.   Your sores do not heal within 2 weeks.   You have a weakened immune system.   You have frequent recurrences of cold sores.  MAKE SURE YOU:   Understand these instructions.  Will watch your condition.  Will get help right away if you are not doing well or get worse. Document Released: 11/18/2000 Document Revised: 08/15/2012 Document Reviewed: 04/04/2012 [facility] Patient Information 2015 [place], [place]. This information is not intended to replace advice given to you by your health care provider. Make sure you discuss any questions you have with your health care provider.

## 2014-06-03 NOTE — Assessment & Plan Note (Signed)
Will give trial of valtrex.  Pt is instructed to call if symptoms worsen or if symptoms do not improve.

## 2014-06-10 ENCOUNTER — Telehealth: Payer: Self-pay | Admitting: Family

## 2014-06-10 MED ORDER — VALACYCLOVIR HCL 1 G PO TABS: ORAL_TABLET | ORAL | Status: DC

## 2014-06-10 NOTE — Telephone Encounter (Signed)
Please call in valtrex to pharmacy below.

## 2014-06-10 NOTE — Telephone Encounter (Signed)
Rx called to pharmacy voicemail below. Left message on pt's voicemail re: rx completion and to call if any questions.

## 2014-06-10 NOTE — Telephone Encounter (Signed)
Patient called in stating that the medication for his fever blister worked well and that the blister did go away but is now back. He wanted to know what else Melissa recommend he do?

## 2014-06-10 NOTE — Telephone Encounter (Signed)
Patient called back regarding this stating that he is in [facility] and would like medication sent to [place] p: [phone]

## 2014-06-20 ENCOUNTER — Telehealth: Payer: Self-pay | Admitting: Family

## 2014-06-20 MED ORDER — VALACYCLOVIR HCL 500 MG PO TABS: 500.0000 mg | ORAL_TABLET | Freq: Every day | ORAL | Status: DC

## 2014-06-20 NOTE — Telephone Encounter (Signed)
Patient left message stating he is still having problems with cold sores, states that they go away with medication and then a week and half later they return, requesting another refill on send to [facility] for current outbreak

## 2014-06-20 NOTE — Telephone Encounter (Signed)
Notified pt. 

## 2014-06-20 NOTE — Telephone Encounter (Signed)
Left message for pt to return my call.

## 2014-06-20 NOTE — Telephone Encounter (Signed)
Try once daily valtrex to help prevent recurrence.  Rx sent to pharmacy.Let me know if symptoms do not become less frequent with this regimen.

## 2014-06-30 ENCOUNTER — Encounter: Payer: Self-pay | Admitting: Family Medicine

## 2014-06-30 ENCOUNTER — Ambulatory Visit (INDEPENDENT_AMBULATORY_CARE_PROVIDER_SITE_OTHER): Payer: PRIVATE HEALTH INSURANCE | Admitting: Family Medicine

## 2014-06-30 VITALS — BP 131/75 | HR 58 | Temp 97.5°F | Ht 69.0 in | Wt 194.0 lb

## 2014-06-30 DIAGNOSIS — B009 Herpesviral infection, unspecified: Secondary | ICD-10-CM

## 2014-06-30 DIAGNOSIS — B001 Herpesviral vesicular dermatitis: Secondary | ICD-10-CM

## 2014-06-30 MED ORDER — VALACYCLOVIR HCL 1 G PO TABS: ORAL_TABLET | ORAL | Status: DC

## 2014-06-30 NOTE — Progress Notes (Signed)
OFFICE NOTE  06/30/2014  CC:  Chief Complaint  Patient presents with  . Mouth Lesions     HPI: Patient is a 63 y.o. Caucasian male who is here for lesion on lip. Recurrent painful sores on lips x 2-3 mo, acute valtrex dosing not much help. Apparently has been some confusion about whether or not he got rx'd daily suppressive therapy recently. We called his pharmacy today and straitened things out.  Pertinent PMH:  Past medical, surgical, social, and family history reviewed and no changes are noted since last office visit.  MEDS:  Outpatient Prescriptions Prior to Visit  Medication Sig Dispense Refill  . amLODipine (NORVASC) 5 MG tablet TAKE 1 TABLET (5 MG TOTAL) BY MOUTH DAILY.  30 tablet  3  . atorvastatin (LIPITOR) 20 MG tablet Take 1 tablet (20 mg total) by mouth daily.  30 tablet  3  . chlorthalidone (HYGROTON) 25 MG tablet Take 1 tablet (25 mg total) by mouth daily.  30 tablet  3  . valACYclovir (VALTREX) 500 MG tablet Take 1 tablet (500 mg total) by mouth daily.  30 tablet  5  . vardenafil (LEVITRA) 20 MG tablet Take 1 tablet (20 mg total) by mouth daily as needed for erectile dysfunction.  10 tablet  3   No facility-administered medications prior to visit.    PE: Blood pressure 131/75, pulse 58, temperature 97.5 F (36.4 C), temperature source Temporal, height 5\' 9"  (1.753 m), weight 194 lb (87.998 kg), SpO2 95.00%. Gen: Alert, well appearing.  Patient is oriented to person, place, time, and situation. Lips: upper lip normal.  Lower lip a bit dry, with 1 small mildly erythematous ulcer on right side at the vermillion border.  IMPRESSION AND PLAN:  Recurrent herpes labialis Some confusion along the line somewhere about rx/dosing. Pt to pick up the 500 mg qd dosing that he was rx'd back on 06/20/14. Also, I rx'd the 2 g q12 x 2 doses "flare" rx with #30 tabs and 1 additional RF.   An After Visit Summary was printed and given to the patient.  FOLLOW UP: prn

## 2014-06-30 NOTE — Assessment & Plan Note (Signed)
Some confusion along the line somewhere about rx/dosing. Pt to pick up the 500 mg qd dosing that he was rx'd back on 06/20/14. Also, I rx'd the 2 g q12 x 2 doses "flare" rx with #30 tabs and 1 additional RF.

## 2014-06-30 NOTE — Progress Notes (Signed)
Pre visit review using our clinic review tool, if applicable. No additional management support is needed unless otherwise documented below in the visit note. 

## 2014-07-18 ENCOUNTER — Telehealth: Payer: Self-pay | Admitting: Family Medicine

## 2014-07-18 NOTE — Telephone Encounter (Signed)
Please advise 

## 2014-07-18 NOTE — Telephone Encounter (Signed)
The medication that the patient has been using for the cold sore on his lip Valtrex 500MG  or 1000MG  is not working. Is there something else he can use? His pharmacy is [facility] [address]. Patient is also asking what is the time frame that he should expect to see results? 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 Okay to leave detailed message.

## 2014-07-18 NOTE — Telephone Encounter (Signed)
There is no other medication for this problem. It may take a month for the treatment to begin to help.-thx

## 2014-07-18 NOTE — Telephone Encounter (Signed)
Pt aware.

## 2014-07-21 ENCOUNTER — Ambulatory Visit: Payer: PRIVATE HEALTH INSURANCE | Admitting: Family Medicine

## 2014-07-31 ENCOUNTER — Other Ambulatory Visit: Payer: Self-pay | Admitting: Family

## 2014-07-31 NOTE — Telephone Encounter (Signed)
Rx sent to pharmacy. LDM 

## 2014-08-06 ENCOUNTER — Ambulatory Visit (INDEPENDENT_AMBULATORY_CARE_PROVIDER_SITE_OTHER): Payer: PRIVATE HEALTH INSURANCE | Admitting: Family Medicine

## 2014-08-06 ENCOUNTER — Encounter: Payer: Self-pay | Admitting: Family Medicine

## 2014-08-06 VITALS — BP 115/69 | HR 64 | Temp 97.9°F | Resp 18 | Ht 69.0 in | Wt 188.0 lb

## 2014-08-06 DIAGNOSIS — B001 Herpesviral vesicular dermatitis: Secondary | ICD-10-CM

## 2014-08-06 DIAGNOSIS — B009 Herpesviral infection, unspecified: Secondary | ICD-10-CM

## 2014-08-06 DIAGNOSIS — K130 Diseases of lips: Secondary | ICD-10-CM

## 2014-08-06 NOTE — Progress Notes (Signed)
Pre visit review using our clinic review tool, if applicable. No additional management support is needed unless otherwise documented below in the visit note. 

## 2014-08-06 NOTE — Progress Notes (Signed)
OFFICE NOTE  08/06/2014  CC:  Chief Complaint  Patient presents with  . Mouth Lesions   HPI: Patient is a 63 y.o. Caucasian male who is here for "cold sore". Lower lip chronically bothered by painful bumps/ulcers, no change over the last month since I got him on daily valtrex suppression therapy (now on 1000mg  qd and actually has been taking 1000 mg bid on many days).  He occ feels 1-2 days of improvement then things recur. "I haven't kissed my wife in 3 wks".    He otherwise feels well. He exercises daily.  He is outside working in the sun quite a bit.  Pertinent PMH:  Past medical, surgical, social, and family history reviewed and no changes are noted since last office visit.  MEDS:  Outpatient Prescriptions Prior to Visit  Medication Sig Dispense Refill  . amLODipine (NORVASC) 5 MG tablet TAKE 1 TABLET (5 MG TOTAL) BY MOUTH DAILY.  30 tablet  0  . atorvastatin (LIPITOR) 20 MG tablet Take 1 tablet (20 mg total) by mouth daily.  30 tablet  3  . chlorthalidone (HYGROTON) 25 MG tablet TAKE 1 TABLET (25 MG TOTAL) BY MOUTH DAILY.  30 tablet  0  . valACYclovir (VALTREX) 1000 MG tablet 2 tabs po q12 h x 2 doses for acute "flare" of cold sores  30 tablet  1  . valACYclovir (VALTREX) 500 MG tablet Take 1 tablet (500 mg total) by mouth daily.  30 tablet  5  . vardenafil (LEVITRA) 20 MG tablet Take 1 tablet (20 mg total) by mouth daily as needed for erectile dysfunction.  10 tablet  3   No facility-administered medications prior to visit.    PE: Blood pressure 115/69, pulse 64, temperature 97.9 F (36.6 C), temperature source Temporal, resp. rate 18, height 5\' 9"  (1.753 m), weight 188 lb (85.276 kg), SpO2 96.00%. Gen: Alert, well appearing.  Patient is oriented to person, place, time, and situation. ENT: eyes normal, nose clear. Oral cavity without lesion, upper dentures in place. Lower lip with mild superficial peeling diffusely, some mild cracking, and one fairly  indistinct,vesicular--looking lesion on left side of lower lip on vermillion border.  No pustules.    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 No improvement with valtrex suppression x 1 mo, patient frustrated and in pain. I will ask derm to see for clarification of dx, possibly r/o other type of lesion of lower lip like skin cancer. Pt agreeable to this plan. Continue valtrex at max of 1000 mg qd. An After Visit Summary was printed and given to the patient.  FOLLOW UP: prn

## 2014-08-18 ENCOUNTER — Other Ambulatory Visit: Payer: Self-pay | Admitting: Family

## 2014-08-19 NOTE — Telephone Encounter (Signed)
Rx request to pharmacy/SLS  

## 2014-08-22 ENCOUNTER — Other Ambulatory Visit: Payer: Self-pay | Admitting: Family Medicine

## 2014-09-01 ENCOUNTER — Other Ambulatory Visit: Payer: Self-pay | Admitting: Family

## 2014-09-01 NOTE — Telephone Encounter (Signed)
Pt last seen for follow up of BP in 03/2014 and advised 4 month follow up.  Please call pt to arrange appt within 30 days.

## 2014-09-02 NOTE — Telephone Encounter (Signed)
Left message for patient to return my call.

## 2014-09-03 NOTE — Telephone Encounter (Signed)
Left message for patient to return my call.

## 2014-09-04 NOTE — Telephone Encounter (Signed)
Left message for patient to return my call.

## 2014-09-09 NOTE — Telephone Encounter (Signed)
Mailed letter to pt

## 2014-09-17 ENCOUNTER — Telehealth: Payer: Self-pay | Admitting: *Deleted

## 2014-09-17 NOTE — Telephone Encounter (Signed)
Pt has moved to [facility], and would like to switch PCP to Dr. Anitra Lauth.  OK with both providers?

## 2014-09-17 NOTE — Telephone Encounter (Signed)
OK with me.

## 2014-09-18 NOTE — Telephone Encounter (Signed)
Can you get this patient scheduled please? Thank you!

## 2014-09-18 NOTE — Telephone Encounter (Signed)
OK with me.

## 2014-09-19 ENCOUNTER — Encounter: Payer: Self-pay | Admitting: Family Medicine

## 2014-09-19 ENCOUNTER — Ambulatory Visit (INDEPENDENT_AMBULATORY_CARE_PROVIDER_SITE_OTHER): Payer: PRIVATE HEALTH INSURANCE | Admitting: Family Medicine

## 2014-09-19 VITALS — BP 131/85 | HR 54 | Temp 98.6°F | Resp 16 | Ht 69.0 in | Wt 192.0 lb

## 2014-09-19 DIAGNOSIS — Z23 Encounter for immunization: Secondary | ICD-10-CM

## 2014-09-19 DIAGNOSIS — E785 Hyperlipidemia, unspecified: Secondary | ICD-10-CM

## 2014-09-19 DIAGNOSIS — B001 Herpesviral vesicular dermatitis: Secondary | ICD-10-CM

## 2014-09-19 DIAGNOSIS — I10 Essential (primary) hypertension: Secondary | ICD-10-CM

## 2014-09-19 LAB — BASIC METABOLIC PANEL
BUN: 21 mg/dL (ref 6–23)
CHLORIDE: 101 meq/L (ref 96–112)
CO2: 20 mEq/L (ref 19–32)
Calcium: 9.5 mg/dL (ref 8.4–10.5)
Creatinine, Ser: 1.2 mg/dL (ref 0.4–1.5)
GFR: 68.18 mL/min (ref >60.00)
Glucose, Bld: 106 mg/dL — ABNORMAL HIGH (ref 70–99)
POTASSIUM: 4.3 meq/L (ref 3.5–5.1)
Sodium: 133 mEq/L — ABNORMAL LOW (ref 135–145)

## 2014-09-19 LAB — CBC WITH DIFFERENTIAL/PLATELET
BASOS PCT: 0.8 % (ref 0.0–3.0)
Basophils Absolute: 0.1 10*3/uL (ref 0.0–0.1)
EOS PCT: 4.3 % (ref 0.0–5.0)
Eosinophils Absolute: 0.3 10*3/uL (ref 0.0–0.7)
HCT: 46 % (ref 39.0–52.0)
Hemoglobin: 15.2 g/dL (ref 13.0–17.0)
LYMPHS PCT: 19.2 % (ref 12.0–46.0)
Lymphs Abs: 1.2 10*3/uL (ref 0.7–4.0)
MCHC: 33 g/dL (ref 30.0–36.0)
MCV: 90.3 fl (ref 78.0–100.0)
MONOS PCT: 9 % (ref 3.0–12.0)
Monocytes Absolute: 0.6 10*3/uL (ref 0.1–1.0)
NEUTROS PCT: 66.7 % (ref 43.0–77.0)
Neutro Abs: 4.3 10*3/uL (ref 1.4–7.7)
Platelets: 282 10*3/uL (ref 150.0–400.0)
RBC: 5.09 Mil/uL (ref 4.22–5.81)
RDW: 13.9 % (ref 11.5–15.5)
WBC: 6.4 10*3/uL (ref 4.0–10.5)

## 2014-09-19 LAB — LIPID PANEL
CHOLESTEROL: 165 mg/dL (ref 0–200)
HDL: 40.5 mg/dL (ref >39.00)
LDL CALC: 114 mg/dL — AB (ref 0–99)
NonHDL: 124.5
Total CHOL/HDL Ratio: 4
Triglycerides: 53 mg/dL (ref 0.0–149.0)
VLDL: 10.6 mg/dL (ref 0.0–40.0)

## 2014-09-19 MED ORDER — ATORVASTATIN CALCIUM 20 MG PO TABS: ORAL_TABLET | ORAL | Status: DC

## 2014-09-19 NOTE — Progress Notes (Signed)
Pre visit review using our clinic review tool, if applicable. No additional management support is needed unless otherwise documented below in the visit note. 

## 2014-09-19 NOTE — Progress Notes (Signed)
OFFICE NOTE  09/19/2014  CC:  Chief Complaint  Patient presents with  . Follow-up    fasting    HPI: Patient is a 63 y.o. Caucasian male who is here for f/ HTN, hyperlipidemia.  He is officially switching his care to me now.   Lip has been improved, at derm consult there was nothing for them to see or do. He was told to quit taking the valtrex: it burns a lot but doesn't blister like it did.  Pertinent PMH:  Past surgical, social, and family history reviewed and no changes noted since last office visit.  MEDS:  Outpatient Prescriptions Prior to Visit  Medication Sig Dispense Refill  . amLODipine (NORVASC) 5 MG tablet TAKE 1 TABLET (5 MG TOTAL) BY MOUTH DAILY.  30 tablet  0  . atorvastatin (LIPITOR) 20 MG tablet TAKE 1 TABLET (20 MG TOTAL) BY MOUTH DAILY.  30 tablet  0  . chlorthalidone (HYGROTON) 25 MG tablet TAKE 1 TABLET (25 MG TOTAL) BY MOUTH DAILY.  30 tablet  0  . valACYclovir (VALTREX) 1000 MG tablet TAKE 2 TABS BY MOUTH EVERY 12 HOURS FOR 2 DOSES FOR ACUTE FLARE OF COLD SORES  30 tablet  0  . vardenafil (LEVITRA) 20 MG tablet Take 1 tablet (20 mg total) by mouth daily as needed for erectile dysfunction.  10 tablet  3  . valACYclovir (VALTREX) 500 MG tablet Take 1 tablet (500 mg total) by mouth daily.  30 tablet  5   No facility-administered medications prior to visit.    PE: Blood pressure 131/85, pulse 54, temperature 98.6 F (37 C), temperature source Temporal, resp. rate 16, height 5\' 9"  (1.753 m), weight 192 lb (87.091 kg), SpO2 97.00%. Gen: Alert, well appearing.  Patient is oriented to person, place, time, and situation. Lip: no lesions CV: RRR, no m/r/g.   LUNGS: CTA bilat, nonlabored resps, good aeration in all lung fields.  LABS: none today LAST LABS: Lab Results  Component Value Date   WBC 5.5 10/11/2013   HGB 15.0 10/11/2013   HCT 43.1 10/11/2013   MCV 85.2 10/11/2013   PLT 272 10/11/2013     Chemistry      Component Value Date/Time   NA 137 03/14/2014  0842   K 4.0 03/14/2014 0842   CL 102 03/14/2014 0842   CO2 26 03/14/2014 0842   BUN 20 03/14/2014 0842   CREATININE 1.01 03/14/2014 0842      Component Value Date/Time   CALCIUM 9.4 03/14/2014 0842   ALKPHOS 62 03/14/2014 0842   AST 25 03/14/2014 0842   ALT 27 03/14/2014 0842   BILITOT 0.6 03/14/2014 0842     Lab Results  Component Value Date   CHOL 163 10/11/2013   HDL 42 10/11/2013   LDLCALC 110* 10/11/2013   TRIG 54 10/11/2013   CHOLHDL 3.9 10/11/2013      IMPRESSION AND PLAN:  1) HTN; The current medical regimen is effective;  continue present plan and medications. BMET today.  2) Hyperlipidemia: The current medical regimen is effective;  continue present plan and medications. FLP today.  3) Recurrent lower lip burning w/vesicles sometimes: suspect herpes labialis. Derm consult obtained, no lesions to evaluated at the time.  He'll stay off his valtrex and when he has a lesion he'll go strait to derm office for eval per their instructions.  An After Visit Summary was printed and given to the patient.  FOLLOW UP: 6 mo for CPE with fasting labs the week prior.

## 2014-09-22 ENCOUNTER — Telehealth: Payer: Self-pay | Admitting: Family Medicine

## 2014-09-22 NOTE — Telephone Encounter (Signed)
emmi mailed  °

## 2014-09-25 ENCOUNTER — Encounter: Payer: Self-pay | Admitting: Family Medicine

## 2014-10-03 ENCOUNTER — Other Ambulatory Visit: Payer: Self-pay | Admitting: Family

## 2014-10-04 ENCOUNTER — Other Ambulatory Visit: Payer: Self-pay | Admitting: Family

## 2014-10-07 ENCOUNTER — Other Ambulatory Visit: Payer: Self-pay | Admitting: Family Medicine

## 2014-10-07 MED ORDER — AMLODIPINE BESYLATE 5 MG PO TABS: ORAL_TABLET | ORAL | Status: DC

## 2014-10-07 MED ORDER — CHLORTHALIDONE 25 MG PO TABS: ORAL_TABLET | ORAL | Status: DC

## 2014-10-09 ENCOUNTER — Encounter: Payer: PRIVATE HEALTH INSURANCE | Admitting: Family Medicine

## 2014-10-16 ENCOUNTER — Ambulatory Visit (INDEPENDENT_AMBULATORY_CARE_PROVIDER_SITE_OTHER): Payer: PRIVATE HEALTH INSURANCE | Admitting: Family Medicine

## 2014-10-16 ENCOUNTER — Encounter: Payer: Self-pay | Admitting: Family Medicine

## 2014-10-16 VITALS — BP 121/77 | HR 52 | Temp 98.2°F | Resp 16 | Ht 69.0 in | Wt 196.0 lb

## 2014-10-16 DIAGNOSIS — Z0000 Encounter for general adult medical examination without abnormal findings: Secondary | ICD-10-CM

## 2014-10-16 DIAGNOSIS — N5201 Erectile dysfunction due to arterial insufficiency: Secondary | ICD-10-CM

## 2014-10-16 DIAGNOSIS — Z125 Encounter for screening for malignant neoplasm of prostate: Secondary | ICD-10-CM

## 2014-10-16 DIAGNOSIS — R7301 Impaired fasting glucose: Secondary | ICD-10-CM

## 2014-10-16 HISTORY — DX: Encounter for general adult medical examination without abnormal findings: Z00.00

## 2014-10-16 LAB — HEMOGLOBIN A1C: Hgb A1c MFr Bld: 5.9 % (ref 4.6–6.5)

## 2014-10-16 LAB — PSA: PSA: 0.46 ng/mL (ref 0.10–4.00)

## 2014-10-16 MED ORDER — SILDENAFIL CITRATE 100 MG PO TABS: ORAL_TABLET | ORAL | Status: DC

## 2014-10-16 MED ORDER — ZOSTER VACCINE LIVE 19400 UNT/0.65ML ~~LOC~~ SOLR: 0.6500 mL | Freq: Once | SUBCUTANEOUS | Status: DC

## 2014-10-16 NOTE — Assessment & Plan Note (Signed)
Switch to viagra as per pt's request, rx sent to pharmacy.

## 2014-10-16 NOTE — Assessment & Plan Note (Signed)
Discussed this today. Will do HbA1c today.

## 2014-10-16 NOTE — Progress Notes (Signed)
Pre visit review using our clinic review tool, if applicable. No additional management support is needed unless otherwise documented below in the visit note. 

## 2014-10-16 NOTE — Progress Notes (Signed)
Office Note 10/16/2014  CC:  Chief Complaint  Patient presents with  . Annual Exam    fasting   HPI:  Leroy Proctor is a 63 y.o. White male who is here today for CPE. REviewed recent labs: mildly elevated fasting sugar.  Cholesterol stable. Hx of Hba1c 5.8% about 2 yrs ago. He says he pays attention to diet and tries to eat a fairly low carb and heart healthy diet.   Needs PSA screening today.  He wants to switch ED meds: wants to go back to viagra.  Says it works better for him but caused him a slight headache.   Past Medical History  Diagnosis Date  . Hypertension   . Hyperlipidemia   . Personal history of kidney stones   . Recurrent herpes labialis   . Chronic renal insufficiency, stage II (mild)     CrCl 60s  . Herpes zoster     R side of face    Past Surgical History  Procedure Laterality Date  . Lithotripsy    . Colonoscopy  2002; 2014    Repeat 2024    Family History  Problem Relation Age of Onset  . Colon cancer Neg Hx     History   Social History  . Marital Status: Married    Spouse Name: N/A    Number of Children: N/A  . Years of Education: N/A   Occupational History  . Not on file.   Social History Main Topics  . Smoking status: Former Research scientist (life sciences)  . Smokeless tobacco: Never Used  . Alcohol Use: 1.2 oz/week    1 Glasses of wine, 1 Cans of beer per week  . Drug Use: No  . Sexual Activity: Not on file   Other Topics Concern  . Not on file   Social History Narrative   Married, no children.   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   No T/A/Ds.   Exercise: stationary bike/nautilus 5 days a week.            MEDS: not taking either valtrex rx listed below Outpatient Prescriptions Prior to Visit  Medication Sig Dispense Refill  . amLODipine (NORVASC) 5 MG tablet TAKE 1 TABLET (5 MG TOTAL) BY MOUTH DAILY. 30 tablet 3  . atorvastatin (LIPITOR) 20 MG tablet TAKE 1 TABLET (20 MG TOTAL) BY MOUTH DAILY. 7 tablet 0  . chlorthalidone  (HYGROTON) 25 MG tablet TAKE 1 TABLET (25 MG TOTAL) BY MOUTH DAILY. 30 tablet 3  . vardenafil (LEVITRA) 20 MG tablet Take 1 tablet (20 mg total) by mouth daily as needed for erectile dysfunction. 10 tablet 3  . valACYclovir (VALTREX) 1000 MG tablet TAKE 2 TABS BY MOUTH EVERY 12 HOURS FOR 2 DOSES FOR ACUTE FLARE OF COLD SORES 30 tablet 0  . valACYclovir (VALTREX) 500 MG tablet Take 1 tablet (500 mg total) by mouth daily. 30 tablet 5   No facility-administered medications prior to visit.    Allergies  Allergen Reactions  . Atenolol     bradycarda- heart rate down to 38 bpm  . Demerol Nausea And Vomiting    vomiting    ROS Review of Systems  Constitutional: Negative for fever, chills, appetite change and fatigue.  HENT: Negative for congestion, dental problem, ear pain and sore throat.   Eyes: Negative for discharge, redness and visual disturbance.  Respiratory: Negative for cough, chest tightness, shortness of breath and wheezing.   Cardiovascular: Negative for chest pain, palpitations and leg swelling.  Gastrointestinal: Negative for nausea, vomiting, abdominal pain, diarrhea and blood in stool.  Genitourinary: Negative for dysuria, urgency, frequency, hematuria, flank pain and difficulty urinating.  Musculoskeletal: Negative for myalgias, back pain, joint swelling, arthralgias and neck stiffness.  Skin: Negative for pallor and rash.  Neurological: Negative for dizziness, speech difficulty, weakness and headaches.  Hematological: Negative for adenopathy. Does not bruise/bleed easily.  Psychiatric/Behavioral: Negative for confusion and sleep disturbance. The patient is not nervous/anxious.     PE; Blood pressure 121/77, pulse 52, temperature 98.2 F (36.8 C), temperature source Temporal, resp. rate 16, height 5\' 9"  (1.753 m), weight 196 lb (88.905 kg), SpO2 96 %. Gen: Alert, well appearing.  Patient is oriented to person, place, time, and situation. AFFECT: pleasant, lucid thought  and speech. ENT: Ears: EACs clear, normal epithelium.  TMs with good light reflex and landmarks bilaterally.  Eyes: no injection, icteris, swelling, or exudate.  EOMI, PERRLA. Nose: no drainage or turbinate edema/swelling.  No injection or focal lesion.  Mouth: lips without lesion/swelling.  Oral mucosa pink and moist.  Dentition intact and without obvious caries or gingival swelling.  Oropharynx without erythema, exudate, or swelling.  Neck: supple/nontender.  No LAD, mass, or TM.  Carotid pulses 2+ bilaterally, without bruits. CV: RRR, no m/r/g.   LUNGS: CTA bilat, nonlabored resps, good aeration in all lung fields. ABD: soft, NT, ND, BS normal.  No hepatospenomegaly or mass.  No bruits. EXT: no clubbing, cyanosis, or edema.  Musculoskeletal: no joint swelling, erythema, warmth, or tenderness.  ROM of all joints intact. Skin - no sores or suspicious lesions or rashes or color changes Rectal exam: negative without mass, lesions or tenderness, PROSTATE EXAM: smooth and symmetric without nodules or tenderness.   Pertinent labs:  Lab Results  Component Value Date   CHOL 165 09/19/2014   HDL 40.50 09/19/2014   LDLCALC 114* 09/19/2014   TRIG 53.0 09/19/2014   CHOLHDL 4 09/19/2014   Lab Results  Component Value Date   WBC 6.4 09/19/2014   HGB 15.2 09/19/2014   HCT 46.0 09/19/2014   MCV 90.3 09/19/2014   PLT 282.0 09/19/2014     Chemistry      Component Value Date/Time   NA 133* 09/19/2014 0823   K 4.3 09/19/2014 0823   CL 101 09/19/2014 0823   CO2 20 09/19/2014 0823   BUN 21 09/19/2014 0823   CREATININE 1.2 09/19/2014 0823   CREATININE 1.01 03/14/2014 0842      Component Value Date/Time   CALCIUM 9.5 09/19/2014 0823   ALKPHOS 62 03/14/2014 0842   AST 25 03/14/2014 0842   ALT 27 03/14/2014 0842   BILITOT 0.6 03/14/2014 0842     Lab Results  Component Value Date   HGBA1C 5.8* 09/03/2012    ASSESSMENT AND PLAN:   Health maintenance examination Reviewed age and gender  appropriate health maintenance issues (prudent diet, regular exercise, health risks of tobacco and excessive alcohol, use of seatbelts, fire alarms in home, use of sunscreen).  Also reviewed age and gender appropriate health screening as well as vaccine recommendations. UTD on flu vaccines. DRE normal today, PSA test drawn. Forgot to give pt zostavax rx--will get this to him. Colon ca screening UTD.  Impaired fasting glucose Discussed this today. Will do HbA1c today.   Erectile dysfunction Switch to viagra as per pt's request, rx sent to pharmacy.   An After Visit Summary was printed and given to the patient.  FOLLOW UP:  Return in about 6 months (around  04/16/2015) for routine chronic illness f/u.

## 2014-10-16 NOTE — Assessment & Plan Note (Signed)
Reviewed age and gender appropriate health maintenance issues (prudent diet, regular exercise, health risks of tobacco and excessive alcohol, use of seatbelts, fire alarms in home, use of sunscreen).  Also reviewed age and gender appropriate health screening as well as vaccine recommendations. UTD on flu vaccines. DRE normal today, PSA test drawn. Forgot to give pt zostavax rx--will get this to him. Colon ca screening UTD.

## 2014-10-19 ENCOUNTER — Other Ambulatory Visit: Payer: Self-pay | Admitting: Family Medicine

## 2015-02-07 ENCOUNTER — Other Ambulatory Visit: Payer: Self-pay | Admitting: Family Medicine

## 2015-02-07 DIAGNOSIS — I10 Essential (primary) hypertension: Secondary | ICD-10-CM

## 2015-02-07 DIAGNOSIS — E785 Hyperlipidemia, unspecified: Secondary | ICD-10-CM

## 2015-03-20 ENCOUNTER — Ambulatory Visit: Payer: PRIVATE HEALTH INSURANCE | Admitting: Family Medicine

## 2015-04-23 ENCOUNTER — Encounter: Payer: Self-pay | Admitting: Family Medicine

## 2015-04-23 ENCOUNTER — Ambulatory Visit (INDEPENDENT_AMBULATORY_CARE_PROVIDER_SITE_OTHER): Payer: PRIVATE HEALTH INSURANCE | Admitting: Family Medicine

## 2015-04-23 VITALS — BP 106/64 | HR 68 | Temp 98.3°F | Resp 16 | Wt 196.0 lb

## 2015-04-23 DIAGNOSIS — E785 Hyperlipidemia, unspecified: Secondary | ICD-10-CM | POA: Diagnosis not present

## 2015-04-23 DIAGNOSIS — R58 Hemorrhage, not elsewhere classified: Secondary | ICD-10-CM

## 2015-04-23 DIAGNOSIS — I10 Essential (primary) hypertension: Secondary | ICD-10-CM

## 2015-04-23 DIAGNOSIS — Z0000 Encounter for general adult medical examination without abnormal findings: Secondary | ICD-10-CM | POA: Diagnosis not present

## 2015-04-23 LAB — BASIC METABOLIC PANEL
BUN: 23 mg/dL (ref 6–23)
CHLORIDE: 100 meq/L (ref 96–112)
CO2: 28 mEq/L (ref 19–32)
Calcium: 9.4 mg/dL (ref 8.4–10.5)
Creatinine, Ser: 1.26 mg/dL (ref 0.40–1.50)
GFR: 61.24 mL/min (ref >60.00)
Glucose, Bld: 96 mg/dL (ref 70–99)
POTASSIUM: 3.8 meq/L (ref 3.5–5.1)
SODIUM: 136 meq/L (ref 135–145)

## 2015-04-23 MED ORDER — ATORVASTATIN CALCIUM 20 MG PO TABS: ORAL_TABLET | ORAL | Status: DC

## 2015-04-23 MED ORDER — CHLORTHALIDONE 25 MG PO TABS: ORAL_TABLET | ORAL | Status: DC

## 2015-04-23 MED ORDER — ZOSTER VACCINE LIVE 19400 UNT/0.65ML ~~LOC~~ SOLR: 0.6500 mL | Freq: Once | SUBCUTANEOUS | Status: DC

## 2015-04-23 MED ORDER — AMLODIPINE BESYLATE 5 MG PO TABS: ORAL_TABLET | ORAL | Status: DC

## 2015-04-23 NOTE — Progress Notes (Signed)
Pre visit review using our clinic review tool, if applicable. No additional management support is needed unless otherwise documented below in the visit note. 

## 2015-04-23 NOTE — Progress Notes (Signed)
OFFICE NOTE  04/23/2015  CC:  Chief Complaint  Patient presents with  . Follow-up    Pt is not fasting     HPI: Patient is a 64 y.o. Caucasian male who is here for 6 mo f/u HTN, hyperlipidemia, IFG. Things going well, feeling good. Exercising helping a lot with leg pains he used to have.  Home bp monitoring: normal bp's every time. Compliant with bp meds and statin, no side effects.  Noted a small bruise on lower eyelid a few days ago, then worked in closed space straining a lot and noted afterwards the spot was quite a bit larger.  No pain.  No vision complaints.   Pertinent PMH:  Past medical, surgical, social, and family history reviewed and no changes are noted since last office visit.  MEDS:  ASA 81 mg qd Outpatient Prescriptions Prior to Visit  Medication Sig Dispense Refill  . amLODipine (NORVASC) 5 MG tablet TAKE 1 TABLET (5 MG TOTAL) BY MOUTH DAILY. 30 tablet 2  . atorvastatin (LIPITOR) 20 MG tablet TAKE 1 TABLET (20 MG TOTAL) BY MOUTH DAILY. 30 tablet 2  . chlorthalidone (HYGROTON) 25 MG tablet TAKE 1 TABLET (25 MG TOTAL) BY MOUTH DAILY. 30 tablet 2  . sildenafil (VIAGRA) 100 MG tablet 1/2-1 tab po qd prn (take 30 min prior to intercourse) 6 tablet 6  . valACYclovir (VALTREX) 1000 MG tablet TAKE 2 TABS BY MOUTH EVERY 12 HOURS FOR 2 DOSES FOR ACUTE FLARE OF COLD SORES (Patient not taking: Reported on 04/23/2015) 30 tablet 0  . valACYclovir (VALTREX) 500 MG tablet Take 1 tablet (500 mg total) by mouth daily. (Patient not taking: Reported on 04/23/2015) 30 tablet 5  . zoster vaccine live, PF, (ZOSTAVAX) 52841 UNT/0.65ML injection Inject 19,400 Units into the skin once. 1 vial 0   No facility-administered medications prior to visit.    PE: Blood pressure 106/64, pulse 68, temperature 98.3 F (36.8 C), temperature source Oral, resp. rate 16, weight 196 lb (88.905 kg). Gen: Alert, well appearing.  Patient is oriented to person, place, time, and situation. Face: small  blotch of violaceous colored ecchymoses on left lower eye lid.  No mass or tenderness.   Globe of eye appears normal.   LABS:  Lab Results  Component Value Date   HGBA1C 5.9 10/16/2014     Chemistry      Component Value Date/Time   NA 133* 09/19/2014 0823   K 4.3 09/19/2014 0823   CL 101 09/19/2014 0823   CO2 20 09/19/2014 0823   BUN 21 09/19/2014 0823   CREATININE 1.2 09/19/2014 0823   CREATININE 1.01 03/14/2014 0842      Component Value Date/Time   CALCIUM 9.5 09/19/2014 0823   ALKPHOS 62 03/14/2014 0842   AST 25 03/14/2014 0842   ALT 27 03/14/2014 0842   BILITOT 0.6 03/14/2014 0842     Lab Results  Component Value Date   CHOL 165 09/19/2014   HDL 40.50 09/19/2014   LDLCALC 114* 09/19/2014   TRIG 53.0 09/19/2014   CHOLHDL 4 09/19/2014     IMPRESSION AND PLAN:  1) HTN; The current medical regimen is effective;  continue present plan and medications. BMET today.  2) Hyperlipidemia; The current medical regimen is effective;  continue present plan and medications.  3) Preventative health care: zostavax rx printed yet again for the patient.  4) Left lower eyelid ecchymoses: reassured--pt on aspirin and straining lately while working.  An After Visit Summary was printed and given  to the patient.  FOLLOW UP: 6 mo for fasting CPE

## 2015-04-28 ENCOUNTER — Telehealth: Payer: Self-pay | Admitting: Family Medicine

## 2015-04-28 ENCOUNTER — Encounter: Payer: Self-pay | Admitting: *Deleted

## 2015-04-28 NOTE — Telephone Encounter (Signed)
Please call with lab results. ALso pt. Received a call from our office today May 24 and was wondering if it was Dr. Anitra Lauth?

## 2015-04-28 NOTE — Telephone Encounter (Signed)
Pt called and would like to know lab results. He also received a call from our office today, May 24 and was wondering if it was Dr. Anitra Lauth.

## 2015-04-29 NOTE — Telephone Encounter (Signed)
Left  Message for pt to call back. Per Dr. Anitra Lauth labs were normal.

## 2015-05-07 NOTE — Telephone Encounter (Signed)
Left message for pt to call back  °

## 2015-05-10 ENCOUNTER — Other Ambulatory Visit: Payer: Self-pay | Admitting: Family Medicine

## 2015-11-03 ENCOUNTER — Ambulatory Visit (INDEPENDENT_AMBULATORY_CARE_PROVIDER_SITE_OTHER): Payer: PRIVATE HEALTH INSURANCE | Admitting: Family Medicine

## 2015-11-03 ENCOUNTER — Encounter: Payer: Self-pay | Admitting: Family Medicine

## 2015-11-03 VITALS — BP 118/81 | HR 64 | Temp 98.2°F | Resp 16 | Ht 68.75 in | Wt 197.0 lb

## 2015-11-03 DIAGNOSIS — E785 Hyperlipidemia, unspecified: Secondary | ICD-10-CM | POA: Diagnosis not present

## 2015-11-03 DIAGNOSIS — Z23 Encounter for immunization: Secondary | ICD-10-CM | POA: Diagnosis not present

## 2015-11-03 DIAGNOSIS — Z0000 Encounter for general adult medical examination without abnormal findings: Secondary | ICD-10-CM | POA: Diagnosis not present

## 2015-11-03 DIAGNOSIS — I10 Essential (primary) hypertension: Secondary | ICD-10-CM

## 2015-11-03 DIAGNOSIS — R7301 Impaired fasting glucose: Secondary | ICD-10-CM | POA: Diagnosis not present

## 2015-11-03 DIAGNOSIS — Z125 Encounter for screening for malignant neoplasm of prostate: Secondary | ICD-10-CM | POA: Diagnosis not present

## 2015-11-03 LAB — COMPREHENSIVE METABOLIC PANEL
ALT: 28 U/L (ref 0–53)
AST: 26 U/L (ref 0–37)
Albumin: 4.1 g/dL (ref 3.5–5.2)
Alkaline Phosphatase: 61 U/L (ref 39–117)
BUN: 21 mg/dL (ref 6–23)
CO2: 27 meq/L (ref 19–32)
Calcium: 9.4 mg/dL (ref 8.4–10.5)
Chloride: 100 mEq/L (ref 96–112)
Creatinine, Ser: 1.05 mg/dL (ref 0.40–1.50)
GFR: 75.46 mL/min (ref >60.00)
Glucose, Bld: 94 mg/dL (ref 70–99)
Potassium: 4.3 mEq/L (ref 3.5–5.1)
SODIUM: 137 meq/L (ref 135–145)
Total Bilirubin: 0.7 mg/dL (ref 0.2–1.2)
Total Protein: 7.4 g/dL (ref 6.0–8.3)

## 2015-11-03 LAB — CBC WITH DIFFERENTIAL/PLATELET
BASOS ABS: 0 10*3/uL (ref 0.0–0.1)
Basophils Relative: 0.7 % (ref 0.0–3.0)
EOS ABS: 0.3 10*3/uL (ref 0.0–0.7)
Eosinophils Relative: 4.5 % (ref 0.0–5.0)
HCT: 46.8 % (ref 39.0–52.0)
Hemoglobin: 15.6 g/dL (ref 13.0–17.0)
LYMPHS ABS: 1.1 10*3/uL (ref 0.7–4.0)
Lymphocytes Relative: 19 % (ref 12.0–46.0)
MCHC: 33.4 g/dL (ref 30.0–36.0)
MCV: 87.1 fl (ref 78.0–100.0)
MONO ABS: 0.6 10*3/uL (ref 0.1–1.0)
Monocytes Relative: 9.3 % (ref 3.0–12.0)
NEUTROS ABS: 4 10*3/uL (ref 1.4–7.7)
NEUTROS PCT: 66.5 % (ref 43.0–77.0)
PLATELETS: 269 10*3/uL (ref 150.0–400.0)
RBC: 5.38 Mil/uL (ref 4.22–5.81)
RDW: 13.7 % (ref 11.5–15.5)
WBC: 6 10*3/uL (ref 4.0–10.5)

## 2015-11-03 LAB — TSH: TSH: 0.94 u[IU]/mL (ref 0.35–4.50)

## 2015-11-03 LAB — PSA: PSA: 0.34 ng/mL (ref 0.10–4.00)

## 2015-11-03 LAB — LIPID PANEL
CHOLESTEROL: 190 mg/dL (ref 0–200)
HDL: 55.6 mg/dL (ref >39.00)
LDL CALC: 119 mg/dL — AB (ref 0–99)
NonHDL: 134.43
Total CHOL/HDL Ratio: 3
Triglycerides: 78 mg/dL (ref 0.0–149.0)
VLDL: 15.6 mg/dL (ref 0.0–40.0)

## 2015-11-03 LAB — HEMOGLOBIN A1C: Hgb A1c MFr Bld: 5.9 % (ref 4.6–6.5)

## 2015-11-03 MED ORDER — AMLODIPINE BESYLATE 5 MG PO TABS: ORAL_TABLET | ORAL | Status: DC

## 2015-11-03 MED ORDER — SILDENAFIL CITRATE 100 MG PO TABS: ORAL_TABLET | ORAL | Status: DC

## 2015-11-03 MED ORDER — CHLORTHALIDONE 25 MG PO TABS: ORAL_TABLET | ORAL | Status: DC

## 2015-11-03 MED ORDER — ATORVASTATIN CALCIUM 20 MG PO TABS: ORAL_TABLET | ORAL | Status: DC

## 2015-11-03 NOTE — Progress Notes (Signed)
Pre visit review using our clinic review tool, if applicable. No additional management support is needed unless otherwise documented below in the visit note. 

## 2015-11-03 NOTE — Progress Notes (Signed)
Office Note 11/03/2015  CC:  Chief Complaint  Patient presents with  . Annual Exam    Pt is fasting.    HPI:  Leroy Proctor is a 64 y.o. White male who is here for annual health maintenance exam. No complaints, feeling well. Staying active, exercising 5/7 d/week. Working full time at [employer] in [place] still. Compliant with meds, needs rx's renewed.   Past Medical History  Diagnosis Date  . Hypertension   . Hyperlipidemia   . Personal history of kidney stones   . Recurrent herpes labialis   . Chronic renal insufficiency, stage II (mild)     CrCl 60s  . Herpes zoster     R side of face  . Impaired fasting glucose 2013-2015  . Erectile dysfunction   . Hearing impaired     Past Surgical History  Procedure Laterality Date  . Lithotripsy    . Colonoscopy  2002; 2014    Repeat 2024    Family History  Problem Relation Age of Onset  . Colon cancer Neg Hx     Social History   Social History  . Marital Status: Married    Spouse Name: N/A  . Number of Children: N/A  . Years of Education: N/A   Occupational History  . Not on file.   Social History Main Topics  . Smoking status: Former Research scientist (life sciences)  . Smokeless tobacco: Never Used  . Alcohol Use: 1.2 oz/week    1 Glasses of wine, 1 Cans of beer per week  . Drug Use: No  . Sexual Activity: Not on file   Other Topics Concern  . Not on file   Social History Narrative   Married, no children.   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   No T/A/Ds.   Exercise: stationary bike/nautilus 5 days a week.             Outpatient Prescriptions Prior to Visit  Medication Sig Dispense Refill  . amLODipine (NORVASC) 5 MG tablet TAKE 1 TABLET (5 MG TOTAL) BY MOUTH DAILY. 90 tablet 1  . aspirin 81 MG tablet Take 81 mg by mouth daily.    Marland Kitchen atorvastatin (LIPITOR) 20 MG tablet TAKE 1 TABLET (20 MG TOTAL) BY MOUTH DAILY. 90 tablet 1  . chlorthalidone (HYGROTON) 25 MG tablet TAKE 1 TABLET (25 MG TOTAL) BY  MOUTH DAILY. 90 tablet 1  . sildenafil (VIAGRA) 100 MG tablet 1/2-1 tab po qd prn (take 30 min prior to intercourse) 6 tablet 6  . chlorthalidone (HYGROTON) 25 MG tablet TAKE 1 TABLET (25 MG TOTAL) BY MOUTH DAILY. (Patient not taking: Reported on 11/03/2015) 30 tablet 1  . valACYclovir (VALTREX) 1000 MG tablet TAKE 2 TABS BY MOUTH EVERY 12 HOURS FOR 2 DOSES FOR ACUTE FLARE OF COLD SORES (Patient not taking: Reported on 04/23/2015) 30 tablet 0  . valACYclovir (VALTREX) 500 MG tablet Take 1 tablet (500 mg total) by mouth daily. (Patient not taking: Reported on 04/23/2015) 30 tablet 5  . zoster vaccine live, PF, (ZOSTAVAX) 16109 UNT/0.65ML injection Inject 19,400 Units into the skin once. (Patient not taking: Reported on 11/03/2015) 1 vial 0   No facility-administered medications prior to visit.    Allergies  Allergen Reactions  . Atenolol     bradycarda- heart rate down to 38 bpm  . Demerol Nausea And Vomiting    vomiting    ROS Review of Systems  Constitutional: Negative for fever, chills, appetite change and fatigue.  HENT: Positive for hearing loss (chronic). Negative for congestion, dental problem, ear pain and sore throat.   Eyes: Negative for discharge, redness and visual disturbance.  Respiratory: Negative for cough, chest tightness, shortness of breath and wheezing.   Cardiovascular: Negative for chest pain, palpitations and leg swelling.  Gastrointestinal: Negative for nausea, vomiting, abdominal pain, diarrhea and blood in stool.  Genitourinary: Negative for dysuria, urgency, frequency, hematuria, flank pain and difficulty urinating.  Musculoskeletal: Negative for myalgias, back pain, joint swelling, arthralgias and neck stiffness.  Skin: Negative for pallor and rash.  Neurological: Negative for dizziness, speech difficulty, weakness and headaches.  Hematological: Negative for adenopathy. Does not bruise/bleed easily.  Psychiatric/Behavioral: Negative for confusion and sleep  disturbance. The patient is not nervous/anxious.     PE; Blood pressure 118/81, pulse 64, temperature 98.2 F (36.8 C), temperature source Oral, resp. rate 16, height 5' 8.75" (1.746 m), weight 197 lb (89.359 kg), SpO2 94 %.  BMI 29.3 Gen: Alert, well appearing.  Patient is oriented to person, place, time, and situation. AFFECT: pleasant, lucid thought and speech. ENT: Ears: EACs clear, normal epithelium.  TMs with good light reflex and landmarks bilaterally.  Eyes: no injection, icteris, swelling, or exudate.  EOMI, PERRLA. Nose: no drainage or turbinate edema/swelling.  No injection or focal lesion.  Mouth: lips without lesion/swelling.  Oral mucosa pink and moist.  Dentition intact and without obvious caries or gingival swelling.  Oropharynx without erythema, exudate, or swelling.  Neck: supple/nontender.  No LAD, mass, or TM.  Carotid pulses 2+ bilaterally, without bruits. CV: RRR, no m/r/g.   LUNGS: CTA bilat, nonlabored resps, good aeration in all lung fields. ABD: soft, NT, ND, BS normal.  No hepatospenomegaly or mass.  No bruits. EXT: no clubbing, cyanosis, or edema.  Musculoskeletal: no joint swelling, erythema, warmth, or tenderness.  ROM of all joints intact. Skin - no sores or suspicious lesions or rashes or color changes Rectal exam: negative without mass, lesions or tenderness, PROSTATE EXAM: smooth and symmetric without nodules or tenderness.   Pertinent labs:  Lab Results  Component Value Date   HGBA1C 5.9 10/16/2014    Lab Results  Component Value Date   TSH 1.083 10/11/2013   Lab Results  Component Value Date   WBC 6.4 09/19/2014   HGB 15.2 09/19/2014   HCT 46.0 09/19/2014   MCV 90.3 09/19/2014   PLT 282.0 09/19/2014   Lab Results  Component Value Date   CREATININE 1.26 04/23/2015   BUN 23 04/23/2015   NA 136 04/23/2015   K 3.8 04/23/2015   CL 100 04/23/2015   CO2 28 04/23/2015   Lab Results  Component Value Date   ALT 27 03/14/2014   AST 25  03/14/2014   ALKPHOS 62 03/14/2014   BILITOT 0.6 03/14/2014   Lab Results  Component Value Date   CHOL 165 09/19/2014   Lab Results  Component Value Date   HDL 40.50 09/19/2014   Lab Results  Component Value Date   LDLCALC 114* 09/19/2014   Lab Results  Component Value Date   TRIG 53.0 09/19/2014   Lab Results  Component Value Date   CHOLHDL 4 09/19/2014   Lab Results  Component Value Date   PSA 0.46 10/16/2014   PSA 0.33 10/11/2013    ASSESSMENT AND PLAN:   Health maintenance exam: Reviewed age and gender appropriate health maintenance issues (prudent diet, regular exercise, health risks of tobacco and excessive alcohol, use of seatbelts, fire alarms in home, use of  sunscreen).  Also reviewed age and gender appropriate health screening as well as vaccine recommendations. Flu vaccine today. Fasting HP labs + HbA1c for hx of IFG and PSA for prostate ca screening. DRE normal today. Colon ca screening UTD: next screening colonoscopy due 2024. Hearing impairment: chronic.  Pt declines referral to audiology at this time, says he is waiting/holding out hope that hearing aids will become less expensive as time goes by and technology advances.  He'll call or return if he wants to address this in the future.  An After Visit Summary was printed and given to the patient.  FOLLOW UP:  No Follow-up on file.

## 2015-11-05 ENCOUNTER — Other Ambulatory Visit: Payer: Self-pay | Admitting: Family Medicine

## 2016-04-07 ENCOUNTER — Ambulatory Visit (INDEPENDENT_AMBULATORY_CARE_PROVIDER_SITE_OTHER): Payer: PRIVATE HEALTH INSURANCE | Admitting: Family Medicine

## 2016-04-07 ENCOUNTER — Encounter: Payer: Self-pay | Admitting: Family Medicine

## 2016-04-07 VITALS — BP 121/79 | HR 53 | Temp 98.0°F | Resp 16 | Ht 68.75 in | Wt 190.0 lb

## 2016-04-07 DIAGNOSIS — S161XXA Strain of muscle, fascia and tendon at neck level, initial encounter: Secondary | ICD-10-CM | POA: Diagnosis not present

## 2016-04-07 MED ORDER — HYDROCODONE-ACETAMINOPHEN 5-325 MG PO TABS: 1.0000 | ORAL_TABLET | Freq: Four times a day (QID) | ORAL | Status: DC | PRN

## 2016-04-07 NOTE — Progress Notes (Signed)
OFFICE VISIT  04/07/2016   CC:  Chief Complaint  Patient presents with  . Neck Pain    right side x 1 month   HPI:    Patient is a 65 y.o. Caucasian male who presents for right sided neck pain. Onset about 1 mo ago.  No preceding strain or trauma. Hurts with turning head to the right, worse at night/impairs sleep, hurts when he first gets up in the morning. After getting up and moving he feels mild discomfort some but nothing like the night-time pain.    Hears some popping sometimes when turning neck.   The pain does not radiate anywhere.  No paresthesias.  No arm/hand weakness.  He works his job and does workouts at [employer] with no problem.  Past Medical History  Diagnosis Date  . Hypertension   . Hyperlipidemia   . Personal history of kidney stones   . Recurrent herpes labialis   . Chronic renal insufficiency, stage II (mild)     CrCl 60s  . Herpes zoster     R side of face  . Impaired fasting glucose 2013-2015  . Erectile dysfunction   . Hearing impaired     Past Surgical History  Procedure Laterality Date  . Lithotripsy    . Colonoscopy  2002; 2014    Repeat 2024    Outpatient Prescriptions Prior to Visit  Medication Sig Dispense Refill  . amLODipine (NORVASC) 5 MG tablet TAKE 1 TABLET (5 MG TOTAL) BY MOUTH DAILY. 90 tablet 1  . aspirin 81 MG tablet Take 81 mg by mouth daily.    Marland Kitchen atorvastatin (LIPITOR) 20 MG tablet TAKE 1 TABLET (20 MG TOTAL) BY MOUTH DAILY. 90 tablet 1  . chlorthalidone (HYGROTON) 25 MG tablet TAKE 1 TABLET (25 MG TOTAL) BY MOUTH DAILY. 90 tablet 1  . sildenafil (VIAGRA) 100 MG tablet 1/2-1 tab po qd prn (take 30 min prior to intercourse) 6 tablet 6   No facility-administered medications prior to visit.    Allergies  Allergen Reactions  . Atenolol     bradycarda- heart rate down to 38 bpm  . Demerol Nausea And Vomiting    vomiting    ROS As per HPI  PE: Blood pressure 121/79, pulse 53, temperature 98 F (36.7 C), temperature  source Oral, resp. rate 16, height 5' 8.75" (1.746 m), weight 190 lb (86.183 kg), SpO2 95 %. Gen: Alert, well appearing.  Patient is oriented to person, place, time, and situation. AFFECT: pleasant, lucid thought and speech. Neck ROM : flexion intact but elicits pulling/pain in R sided neck muscles and trapezius mm.  Similar pain is felt with lateral bending of neck and with rotation of neck to the left--and these ranges of motion are limited due to the pain.  Neck extension intact w/out pain.  Mild TTP in R cervical region muscles extending down into the trapezius mm.  No trigger point. Spurling's testing was negative.  UE strength 5/5 prox/dist.  LABS:  none  IMPRESSION AND PLAN:  Cervical muscle strain/pain:  Instructions: Take 3 of the 200mg  OTC ibuprofen pills every morning and every evening for 10 days. Refer to [facility] PT. Vicodin 5/325, 1-2 tabs q6h prn, #20: take when pain most severe/help with rest hs when in pain.  An After Visit Summary was printed and given to the patient.  FOLLOW UP: Return if symptoms worsen or fail to improve.  Signed:  Crissie Sickles, MD  04/07/2016      

## 2016-04-07 NOTE — Patient Instructions (Signed)
Take 3 of the 200mg  OTC ibuprofen pills every morning and every evening for 10 days.

## 2016-04-07 NOTE — Progress Notes (Signed)
Pre visit review using our clinic review tool, if applicable. No additional management support is needed unless otherwise documented below in the visit note. 

## 2016-05-03 ENCOUNTER — Other Ambulatory Visit: Payer: Self-pay | Admitting: Family Medicine

## 2016-05-04 ENCOUNTER — Telehealth: Payer: Self-pay | Admitting: *Deleted

## 2016-05-04 DIAGNOSIS — I10 Essential (primary) hypertension: Secondary | ICD-10-CM

## 2016-05-04 MED ORDER — CHLORTHALIDONE 25 MG PO TABS: ORAL_TABLET | ORAL | Status: DC

## 2016-05-04 NOTE — Telephone Encounter (Signed)
LOV: 11/03/15 NOV: None  RF request for amlodipine Last written: 11/03/15 #90 w/ 1RF  RF request for atorvastatin Last written: 11/03/15 #90 w Scheryl Darter

## 2016-05-04 NOTE — Telephone Encounter (Signed)
RF request for chlorthalidone LOV: 11/03/15 Next ov: None Last written: 11/03/15 #90 w/ 1RF

## 2016-09-14 ENCOUNTER — Ambulatory Visit (INDEPENDENT_AMBULATORY_CARE_PROVIDER_SITE_OTHER): Payer: PRIVATE HEALTH INSURANCE

## 2016-09-14 DIAGNOSIS — Z23 Encounter for immunization: Secondary | ICD-10-CM

## 2016-11-03 ENCOUNTER — Ambulatory Visit (INDEPENDENT_AMBULATORY_CARE_PROVIDER_SITE_OTHER): Payer: PRIVATE HEALTH INSURANCE | Admitting: Family Medicine

## 2016-11-03 ENCOUNTER — Encounter: Payer: Self-pay | Admitting: Family Medicine

## 2016-11-03 VITALS — BP 138/83 | HR 62 | Temp 97.9°F | Resp 16 | Ht 68.75 in | Wt 193.8 lb

## 2016-11-03 DIAGNOSIS — Z125 Encounter for screening for malignant neoplasm of prostate: Secondary | ICD-10-CM

## 2016-11-03 DIAGNOSIS — I10 Essential (primary) hypertension: Secondary | ICD-10-CM | POA: Diagnosis not present

## 2016-11-03 DIAGNOSIS — Z0000 Encounter for general adult medical examination without abnormal findings: Secondary | ICD-10-CM

## 2016-11-03 DIAGNOSIS — Z23 Encounter for immunization: Secondary | ICD-10-CM | POA: Diagnosis not present

## 2016-11-03 DIAGNOSIS — R7301 Impaired fasting glucose: Secondary | ICD-10-CM

## 2016-11-03 LAB — CBC WITH DIFFERENTIAL/PLATELET
BASOS PCT: 0.7 % (ref 0.0–3.0)
Basophils Absolute: 0 10*3/uL (ref 0.0–0.1)
EOS ABS: 0.2 10*3/uL (ref 0.0–0.7)
Eosinophils Relative: 3.3 % (ref 0.0–5.0)
HCT: 45.6 % (ref 39.0–52.0)
Hemoglobin: 15.3 g/dL (ref 13.0–17.0)
LYMPHS ABS: 1.3 10*3/uL (ref 0.7–4.0)
Lymphocytes Relative: 17.5 % (ref 12.0–46.0)
MCHC: 33.4 g/dL (ref 30.0–36.0)
MCV: 86.5 fl (ref 78.0–100.0)
MONO ABS: 0.7 10*3/uL (ref 0.1–1.0)
Monocytes Relative: 9.4 % (ref 3.0–12.0)
NEUTROS ABS: 5 10*3/uL (ref 1.4–7.7)
NEUTROS PCT: 69.1 % (ref 43.0–77.0)
PLATELETS: 264 10*3/uL (ref 150.0–400.0)
RBC: 5.27 Mil/uL (ref 4.22–5.81)
RDW: 13.4 % (ref 11.5–15.5)
WBC: 7.3 10*3/uL (ref 4.0–10.5)

## 2016-11-03 LAB — COMPREHENSIVE METABOLIC PANEL
ALT: 16 U/L (ref 0–53)
AST: 19 U/L (ref 0–37)
Albumin: 4.2 g/dL (ref 3.5–5.2)
Alkaline Phosphatase: 61 U/L (ref 39–117)
BUN: 21 mg/dL (ref 6–23)
CALCIUM: 9.4 mg/dL (ref 8.4–10.5)
CHLORIDE: 99 meq/L (ref 96–112)
CO2: 32 meq/L (ref 19–32)
CREATININE: 1.13 mg/dL (ref 0.40–1.50)
GFR: 69.11 mL/min (ref >60.00)
Glucose, Bld: 107 mg/dL — ABNORMAL HIGH (ref 70–99)
Potassium: 4 mEq/L (ref 3.5–5.1)
SODIUM: 137 meq/L (ref 135–145)
Total Bilirubin: 0.8 mg/dL (ref 0.2–1.2)
Total Protein: 7.5 g/dL (ref 6.0–8.3)

## 2016-11-03 LAB — LIPID PANEL
CHOL/HDL RATIO: 3
Cholesterol: 172 mg/dL (ref 0–200)
HDL: 50.8 mg/dL (ref >39.00)
LDL CALC: 110 mg/dL — AB (ref 0–99)
NonHDL: 120.89
TRIGLYCERIDES: 53 mg/dL (ref 0.0–149.0)
VLDL: 10.6 mg/dL (ref 0.0–40.0)

## 2016-11-03 LAB — HEMOGLOBIN A1C: HEMOGLOBIN A1C: 5.9 % (ref 4.6–6.5)

## 2016-11-03 MED ORDER — AMLODIPINE BESYLATE 5 MG PO TABS: ORAL_TABLET | ORAL | 3 refills | Status: DC

## 2016-11-03 MED ORDER — ATORVASTATIN CALCIUM 20 MG PO TABS: ORAL_TABLET | ORAL | 3 refills | Status: DC

## 2016-11-03 MED ORDER — SILDENAFIL CITRATE 100 MG PO TABS: ORAL_TABLET | ORAL | 6 refills | Status: DC

## 2016-11-03 MED ORDER — CHLORTHALIDONE 25 MG PO TABS: ORAL_TABLET | ORAL | 3 refills | Status: DC

## 2016-11-03 NOTE — Progress Notes (Signed)
Pre visit review using our clinic review tool, if applicable. No additional management support is needed unless otherwise documented below in the visit note. 

## 2016-11-03 NOTE — Progress Notes (Signed)
Office Note 11/03/2016  CC:  Chief Complaint  Patient presents with  . Annual Exam    Pt is fasting.     HPI:  Leroy Proctor is a 65 y.o. male who is here for annual health maintenance exam.  No acute complaints.   Past Medical History:  Diagnosis Date  . Chronic renal insufficiency, stage II (mild)    CrCl 60s  . Erectile dysfunction   . Hearing impaired   . Herpes zoster    R side of face  . Hyperlipidemia   . Hypertension   . Impaired fasting glucose 2013-2015  . Personal history of kidney stones   . Recurrent herpes labialis     Past Surgical History:  Procedure Laterality Date  . COLONOSCOPY  2002; 2014   Repeat 2024  . LITHOTRIPSY      Family History  Problem Relation Age of Onset  . Colon cancer Neg Hx     Social History   Social History  . Marital status: Married    Spouse name: N/A  . Number of children: N/A  . Years of education: N/A   Occupational History  . Not on file.   Social History Main Topics  . Smoking status: Former Research scientist (life sciences)  . Smokeless tobacco: Never Used  . Alcohol use 1.2 oz/week    1 Glasses of wine, 1 Cans of beer per week  . Drug use: No  . Sexual activity: Not on file   Other Topics Concern  . Not on file   Social History Narrative   Married, no children.   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   No T/A/Ds.   Exercise: stationary bike/nautilus 5 days a week.             Outpatient Medications Prior to Visit  Medication Sig Dispense Refill  . aspirin 81 MG tablet Take 81 mg by mouth daily.    Marland Kitchen HYDROcodone-acetaminophen (NORCO/VICODIN) 5-325 MG tablet Take 1-2 tablets by mouth every 6 (six) hours as needed for moderate pain. 20 tablet 0  . amLODipine (NORVASC) 5 MG tablet TAKE 1 TABLET (5 MG TOTAL) BY MOUTH DAILY. 90 tablet 1  . atorvastatin (LIPITOR) 20 MG tablet TAKE 1 TABLET (20 MG TOTAL) BY MOUTH DAILY. 90 tablet 1  . chlorthalidone (HYGROTON) 25 MG tablet TAKE 1 TABLET (25 MG TOTAL) BY MOUTH  DAILY. 90 tablet 1  . sildenafil (VIAGRA) 100 MG tablet 1/2-1 tab po qd prn (take 30 min prior to intercourse) 6 tablet 6   No facility-administered medications prior to visit.     Allergies  Allergen Reactions  . Atenolol     bradycarda- heart rate down to 38 bpm  . Demerol Nausea And Vomiting    vomiting    ROS Review of Systems  Constitutional: Negative for appetite change, chills, fatigue and fever.  HENT: Negative for congestion, dental problem, ear pain and sore throat.   Eyes: Negative for discharge, redness and visual disturbance.  Respiratory: Negative for cough, chest tightness, shortness of breath and wheezing.   Cardiovascular: Negative for chest pain, palpitations and leg swelling.  Gastrointestinal: Negative for abdominal pain, blood in stool, diarrhea, nausea and vomiting.  Genitourinary: Negative for difficulty urinating, dysuria, flank pain, frequency, hematuria and urgency.  Musculoskeletal: Negative for arthralgias, back pain, joint swelling, myalgias and neck stiffness.  Skin: Negative for pallor and rash.  Neurological: Negative for dizziness, speech difficulty, weakness and headaches.  Hematological: Negative for adenopathy. Does not bruise/bleed easily.  Psychiatric/Behavioral: Negative for confusion and sleep disturbance. The patient is not nervous/anxious.     PE; Blood pressure 138/83, pulse 62, temperature 97.9 F (36.6 C), temperature source Oral, resp. rate 16, height 5' 8.75" (1.746 m), weight 193 lb 12 oz (87.9 kg), SpO2 95 %. Gen: Alert, well appearing.  Patient is oriented to person, place, time, and situation. AFFECT: pleasant, lucid thought and speech. ENT: Ears: EACs clear, normal epithelium.  TMs with good light reflex and landmarks bilaterally.  Eyes: no injection, icteris, swelling, or exudate.  EOMI, PERRLA. Nose: no drainage or turbinate edema/swelling.  No injection or focal lesion.  Mouth: lips without lesion/swelling.  Oral mucosa pink  and moist.  Dentition intact and without obvious caries or gingival swelling.  Oropharynx without erythema, exudate, or swelling.  Neck: supple/nontender.  No LAD, mass, or TM.  Carotid pulses 2+ bilaterally, without bruits. CV: RRR, no m/r/g.   LUNGS: CTA bilat, nonlabored resps, good aeration in all lung fields. ABD: soft, NT, ND, BS normal.  No hepatospenomegaly or mass.  No bruits. EXT: no clubbing, cyanosis, or edema.  Musculoskeletal: no joint swelling, erythema, warmth, or tenderness.  ROM of all joints intact. Skin - no sores or suspicious lesions or rashes or color changes Rectal exam: negative without mass, lesions or tenderness, PROSTATE EXAM: smooth and symmetric without nodules or tenderness.  Pertinent labs:  Lab Results  Component Value Date   TSH 0.94 11/03/2015   Lab Results  Component Value Date   WBC 6.0 11/03/2015   HGB 15.6 11/03/2015   HCT 46.8 11/03/2015   MCV 87.1 11/03/2015   PLT 269.0 11/03/2015   Lab Results  Component Value Date   CREATININE 1.05 11/03/2015   BUN 21 11/03/2015   NA 137 11/03/2015   K 4.3 11/03/2015   CL 100 11/03/2015   CO2 27 11/03/2015   Lab Results  Component Value Date   ALT 28 11/03/2015   AST 26 11/03/2015   ALKPHOS 61 11/03/2015   BILITOT 0.7 11/03/2015   Lab Results  Component Value Date   CHOL 190 11/03/2015   Lab Results  Component Value Date   HDL 55.60 11/03/2015   Lab Results  Component Value Date   LDLCALC 119 (H) 11/03/2015   Lab Results  Component Value Date   TRIG 78.0 11/03/2015   Lab Results  Component Value Date   CHOLHDL 3 11/03/2015   Lab Results  Component Value Date   PSA 0.34 11/03/2015   PSA 0.46 10/16/2014   PSA 0.33 10/11/2013   Lab Results  Component Value Date   HGBA1C 5.9 11/03/2015    ASSESSMENT AND PLAN:   Health maintenance exam: Reviewed age and gender appropriate health maintenance issues (prudent diet, regular exercise, health risks of tobacco and excessive  alcohol, use of seatbelts, fire alarms in home, use of sunscreen).  Also reviewed age and gender appropriate health screening as well as vaccine recommendations. Prevnar 13 given today.  Zostavax rx given to pt to take to his pharmacy. Fasting HP labs today, plus I drew Hba1c for pt's hx of IFG. Prostate ca screening: DRE normal and PSA drawn today. Colon cancer screening: next colonoscopy due 2024.  An After Visit Summary was printed and given to the patient.  FOLLOW UP:  Return in about 1 year (around 11/03/2017) for annual CPE (fasting).   Signed:  Crissie Sickles, MD           11/03/2016

## 2016-11-03 NOTE — Addendum Note (Signed)
Addended by: Onalee Hua on: 11/03/2016 09:37 AM   Modules accepted: Orders

## 2016-11-04 LAB — PSA: PSA: 0.34 ng/mL (ref 0.10–4.00)

## 2016-11-04 LAB — TSH: TSH: 0.76 u[IU]/mL (ref 0.35–4.50)

## 2017-05-22 ENCOUNTER — Telehealth: Payer: Self-pay | Admitting: Family Medicine

## 2017-05-22 ENCOUNTER — Ambulatory Visit: Payer: PRIVATE HEALTH INSURANCE

## 2017-05-22 MED ORDER — ZOSTER VAC RECOMB ADJUVANTED 50 MCG/0.5ML IM SUSR: INTRAMUSCULAR | 1 refills | Status: DC

## 2017-05-22 NOTE — Telephone Encounter (Signed)
Patient was scheduled for Shingrix.  I tried to contact patient to make sure he was still working and had [employer] but I was unable to contact him.     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  Please advise if this vaccine is appropriate.  Thank you.

## 2017-05-22 NOTE — Telephone Encounter (Signed)
Rx eRxed to pharmacy.

## 2017-05-22 NOTE — Telephone Encounter (Signed)
Will write pt a rx for shingrix to take to his pharmacy. Can't get shingrix here at the office.

## 2017-05-22 NOTE — Telephone Encounter (Signed)
Patient is aware, can you please fax RX for [place] @ [phone]

## 2017-10-23 ENCOUNTER — Other Ambulatory Visit: Payer: Self-pay | Admitting: Family Medicine

## 2017-10-23 DIAGNOSIS — I10 Essential (primary) hypertension: Secondary | ICD-10-CM

## 2017-11-06 ENCOUNTER — Encounter: Payer: Self-pay | Admitting: Family Medicine

## 2017-11-06 ENCOUNTER — Other Ambulatory Visit: Payer: Self-pay

## 2017-11-06 ENCOUNTER — Ambulatory Visit (INDEPENDENT_AMBULATORY_CARE_PROVIDER_SITE_OTHER): Payer: PRIVATE HEALTH INSURANCE | Admitting: Family Medicine

## 2017-11-06 VITALS — BP 132/74 | HR 55 | Temp 97.9°F | Resp 16 | Ht 68.0 in | Wt 191.5 lb

## 2017-11-06 DIAGNOSIS — I10 Essential (primary) hypertension: Secondary | ICD-10-CM

## 2017-11-06 DIAGNOSIS — Z9189 Other specified personal risk factors, not elsewhere classified: Secondary | ICD-10-CM

## 2017-11-06 DIAGNOSIS — R7301 Impaired fasting glucose: Secondary | ICD-10-CM | POA: Diagnosis not present

## 2017-11-06 DIAGNOSIS — E7800 Pure hypercholesterolemia, unspecified: Secondary | ICD-10-CM | POA: Diagnosis not present

## 2017-11-06 DIAGNOSIS — Z1159 Encounter for screening for other viral diseases: Secondary | ICD-10-CM | POA: Diagnosis not present

## 2017-11-06 DIAGNOSIS — Z0000 Encounter for general adult medical examination without abnormal findings: Secondary | ICD-10-CM

## 2017-11-06 DIAGNOSIS — Z125 Encounter for screening for malignant neoplasm of prostate: Secondary | ICD-10-CM

## 2017-11-06 DIAGNOSIS — Z23 Encounter for immunization: Secondary | ICD-10-CM | POA: Diagnosis not present

## 2017-11-06 LAB — CBC WITH DIFFERENTIAL/PLATELET
BASOS ABS: 0.1 10*3/uL (ref 0.0–0.1)
Basophils Relative: 1.1 % (ref 0.0–3.0)
Eosinophils Absolute: 0.2 10*3/uL (ref 0.0–0.7)
Eosinophils Relative: 3.3 % (ref 0.0–5.0)
HEMATOCRIT: 45.6 % (ref 39.0–52.0)
HEMOGLOBIN: 15.2 g/dL (ref 13.0–17.0)
LYMPHS PCT: 22.1 % (ref 12.0–46.0)
Lymphs Abs: 1.1 10*3/uL (ref 0.7–4.0)
MCHC: 33.4 g/dL (ref 30.0–36.0)
MCV: 89.4 fl (ref 78.0–100.0)
MONOS PCT: 10.5 % (ref 3.0–12.0)
Monocytes Absolute: 0.5 10*3/uL (ref 0.1–1.0)
NEUTROS ABS: 3.2 10*3/uL (ref 1.4–7.7)
Neutrophils Relative %: 63 % (ref 43.0–77.0)
Platelets: 277 10*3/uL (ref 150.0–400.0)
RBC: 5.1 Mil/uL (ref 4.22–5.81)
RDW: 13.4 % (ref 11.5–15.5)
WBC: 5.1 10*3/uL (ref 4.0–10.5)

## 2017-11-06 LAB — HEMOGLOBIN A1C: Hgb A1c MFr Bld: 5.9 % (ref 4.6–6.5)

## 2017-11-06 LAB — PSA: PSA: 0.63 ng/mL (ref 0.10–4.00)

## 2017-11-06 NOTE — Addendum Note (Signed)
Addended by: Gordy Councilman on: 11/06/2017 09:47 AM   Modules accepted: Orders

## 2017-11-06 NOTE — Progress Notes (Signed)
Office Note 11/06/2017  CC:  Chief Complaint  Patient presents with  . Annual Exam    Pt is fasitng.     HPI:  Leroy Proctor is a 66 y.o. White male who is here for annual health maintenance exam.  Exercise: [employer] 5 days per week 1 hour work outs. Diet: trying to eat low fat, low chol, minimize junk/fast food.  Drinking lots of water. Dental UTD. Eyes: exam UTD.   Past Medical History:  Diagnosis Date  . Chronic renal insufficiency, stage II (mild)    CrCl 60s  . Erectile dysfunction   . Hearing impaired   . Herpes zoster    R side of face  . Hyperlipidemia   . Hypertension   . Impaired fasting glucose 2013-2015  . Personal history of kidney stones   . Recurrent herpes labialis     Past Surgical History:  Procedure Laterality Date  . COLONOSCOPY  2002; 2014   Repeat 2024  . LITHOTRIPSY      Family History  Problem Relation Age of Onset  . Colon cancer Neg Hx     Social History   Socioeconomic History  . Marital status: Married    Spouse name: Not on file  . Number of children: Not on file  . Years of education: Not on file  . Highest education level: Not on file  Social Needs  . Financial resource strain: Not on file  . Food insecurity - worry: Not on file  . Food insecurity - inability: Not on file  . Transportation needs - medical: Not on file  . Transportation needs - non-medical: Not on file  Occupational History  . Not on file  Tobacco Use  . Smoking status: Former Research scientist (life sciences)  . Smokeless tobacco: Never Used  Substance and Sexual Activity  . Alcohol use: Yes    Alcohol/week: 1.2 oz    Types: 1 Glasses of wine, 1 Cans of beer per week  . Drug use: No  . Sexual activity: Not on file  Other Topics Concern  . Not on file  Social History Narrative   Married, no children.   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   No T/A/Ds.   Exercise: stationary bike/nautilus 5 days a week.          Outpatient Medications Prior to Visit   Medication Sig Dispense Refill  . amLODipine (NORVASC) 5 MG tablet TAKE ONE TABLET BY MOUTH DAILY 90 tablet 0  . aspirin 81 MG tablet Take 81 mg by mouth daily.    Marland Kitchen atorvastatin (LIPITOR) 20 MG tablet TAKE ONE TABLET BY MOUTH DAILY 90 tablet 0  . chlorthalidone (HYGROTON) 25 MG tablet TAKE ONE TABLET BY MOUTH DAILY 90 tablet 0  . HYDROcodone-acetaminophen (NORCO/VICODIN) 5-325 MG tablet Take 1-2 tablets by mouth every 6 (six) hours as needed for moderate pain. 20 tablet 0  . sildenafil (VIAGRA) 100 MG tablet 1/2-1 tab po qd prn (take 30 min prior to intercourse) 6 tablet 6  . Zoster Vac Recomb Adjuvanted [facility]) injection Repeat dose in 2 to 6 months. (Patient not taking: Reported on 11/06/2017) 0.5 mL 1   No facility-administered medications prior to visit.     Allergies  Allergen Reactions  . Atenolol     bradycarda- heart rate down to 38 bpm  . Demerol Nausea And Vomiting    vomiting    ROS Review of Systems  Constitutional: Negative for appetite change, chills, fatigue and fever.  HENT: Negative  for congestion, dental problem, ear pain and sore throat.   Eyes: Negative for discharge, redness and visual disturbance.  Respiratory: Negative for cough, chest tightness, shortness of breath and wheezing.   Cardiovascular: Negative for chest pain, palpitations and leg swelling.  Gastrointestinal: Negative for abdominal pain, blood in stool, diarrhea, nausea and vomiting.  Genitourinary: Negative for difficulty urinating, dysuria, flank pain, frequency, hematuria and urgency.  Musculoskeletal: Negative for arthralgias, back pain, joint swelling, myalgias and neck stiffness.  Skin: Negative for pallor and rash.  Neurological: Negative for dizziness, speech difficulty, weakness and headaches.  Hematological: Negative for adenopathy. Does not bruise/bleed easily.  Psychiatric/Behavioral: Negative for confusion and sleep disturbance. The patient is not nervous/anxious.     PE; Blood  pressure 132/74, pulse (!) 55, temperature 97.9 F (36.6 C), temperature source Oral, resp. rate 16, height 5\' 8"  (1.727 m), weight 191 lb 8 oz (86.9 kg), SpO2 95 %. Gen: Alert, well appearing.  Patient is oriented to person, place, time, and situation. AFFECT: pleasant, lucid thought and speech. ENT: Ears: EACs clear, normal epithelium.  TMs with good light reflex and landmarks bilaterally.  Eyes: no injection, icteris, swelling, or exudate.  EOMI, PERRLA. Nose: no drainage or turbinate edema/swelling.  No injection or focal lesion.  Mouth: lips without lesion/swelling.  Oral mucosa pink and moist.  Dentition intact and without obvious caries or gingival swelling.  Oropharynx without erythema, exudate, or swelling.  Neck: supple/nontender.  No LAD, mass, or TM.  Carotid pulses 2+ bilaterally, without bruits. CV: RRR, no m/r/g.   LUNGS: CTA bilat, nonlabored resps, good aeration in all lung fields. ABD: soft, NT, ND, BS normal.  No hepatospenomegaly or mass.  No bruits. EXT: no clubbing, cyanosis, or edema.  Musculoskeletal: no joint swelling, erythema, warmth, or tenderness.  ROM of all joints intact. Skin - no sores or suspicious lesions or rashes or color changes Rectal exam: negative without mass, lesions or tenderness, PROSTATE EXAM: smooth and symmetric without nodules or tenderness.   Pertinent labs:  Lab Results  Component Value Date   TSH 0.76 11/03/2016   Lab Results  Component Value Date   WBC 7.3 11/03/2016   HGB 15.3 11/03/2016   HCT 45.6 11/03/2016   MCV 86.5 11/03/2016   PLT 264.0 11/03/2016   Lab Results  Component Value Date   CREATININE 1.13 11/03/2016   BUN 21 11/03/2016   NA 137 11/03/2016   K 4.0 11/03/2016   CL 99 11/03/2016   CO2 32 11/03/2016   Lab Results  Component Value Date   ALT 16 11/03/2016   AST 19 11/03/2016   ALKPHOS 61 11/03/2016   BILITOT 0.8 11/03/2016   Lab Results  Component Value Date   CHOL 172 11/03/2016   Lab Results   Component Value Date   HDL 50.80 11/03/2016   Lab Results  Component Value Date   LDLCALC 110 (H) 11/03/2016   Lab Results  Component Value Date   TRIG 53.0 11/03/2016   Lab Results  Component Value Date   CHOLHDL 3 11/03/2016   Lab Results  Component Value Date   PSA 0.34 11/03/2016   PSA 0.34 11/03/2015   PSA 0.46 10/16/2014   Lab Results  Component Value Date   HGBA1C 5.9 11/03/2016   ASSESSMENT AND PLAN:   Health maintenance exam: Reviewed age and gender appropriate health maintenance issues (prudent diet, regular exercise, health risks of tobacco and excessive alcohol, use of seatbelts, fire alarms in home, use of sunscreen).  Also  reviewed age and gender appropriate health screening as well as vaccine recommendations. Vaccines: Flu-- given today.  Pneumovax 23--given today. Labs: CBC, CMET, CBC, A1c, PSA- (HTN, HLD, IFG, pro ca scr).  Also, Hep C ab (Hep C screening). Prostate ca screening: DRE normal today , PSA. Colon ca screening: next colonoscopy due 2024.  An After Visit Summary was printed and given to the patient.  FOLLOW UP:  Return in about 6 months (around 05/07/2018) for routine chronic illness f/u + AWV with Maudie Mercury at [facility].  Signed:  Crissie Sickles, MD           11/06/2017

## 2017-11-06 NOTE — Patient Instructions (Signed)

## 2017-11-07 LAB — COMPREHENSIVE METABOLIC PANEL
ALT: 20 U/L (ref 0–53)
AST: 22 U/L (ref 0–37)
Albumin: 4.3 g/dL (ref 3.5–5.2)
Alkaline Phosphatase: 60 U/L (ref 39–117)
BUN: 20 mg/dL (ref 6–23)
CALCIUM: 9.6 mg/dL (ref 8.4–10.5)
CHLORIDE: 101 meq/L (ref 96–112)
CO2: 28 meq/L (ref 19–32)
Creatinine, Ser: 1.11 mg/dL (ref 0.40–1.50)
GFR: 70.33 mL/min (ref >60.00)
Glucose, Bld: 107 mg/dL — ABNORMAL HIGH (ref 70–99)
Potassium: 4 mEq/L (ref 3.5–5.1)
Sodium: 138 mEq/L (ref 135–145)
Total Bilirubin: 0.8 mg/dL (ref 0.2–1.2)
Total Protein: 7.6 g/dL (ref 6.0–8.3)

## 2017-11-07 LAB — LIPID PANEL
Cholesterol: 194 mg/dL (ref 0–200)
HDL: 58.9 mg/dL (ref >39.00)
LDL CALC: 119 mg/dL — AB (ref 0–99)
NonHDL: 135.37
TRIGLYCERIDES: 80 mg/dL (ref 0.0–149.0)
Total CHOL/HDL Ratio: 3
VLDL: 16 mg/dL (ref 0.0–40.0)

## 2017-11-07 LAB — HEPATITIS C ANTIBODY
HEP C AB: NONREACTIVE
SIGNAL TO CUT-OFF: 0.02 (ref <1.00)

## 2017-11-08 ENCOUNTER — Other Ambulatory Visit: Payer: Self-pay | Admitting: Family Medicine

## 2018-01-21 ENCOUNTER — Other Ambulatory Visit: Payer: Self-pay | Admitting: Family Medicine

## 2018-01-21 DIAGNOSIS — I10 Essential (primary) hypertension: Secondary | ICD-10-CM

## 2018-05-14 ENCOUNTER — Ambulatory Visit: Payer: PRIVATE HEALTH INSURANCE | Admitting: Family Medicine

## 2018-05-14 ENCOUNTER — Encounter: Payer: PRIVATE HEALTH INSURANCE | Admitting: Family Medicine

## 2018-07-22 ENCOUNTER — Other Ambulatory Visit: Payer: Self-pay | Admitting: Family Medicine

## 2018-07-22 DIAGNOSIS — I10 Essential (primary) hypertension: Secondary | ICD-10-CM

## 2018-07-23 NOTE — Telephone Encounter (Signed)
Pt advised and voiced understanding.    Apt made for 09/05/18 at 8:00am.

## 2018-07-23 NOTE — Telephone Encounter (Signed)
Left message for pt to call back.   Pt is over due for follow up w/ send Rx's for #90 w/ 0RF.

## 2018-08-20 ENCOUNTER — Encounter: Payer: Self-pay | Admitting: Family Medicine

## 2018-08-20 ENCOUNTER — Ambulatory Visit: Payer: PRIVATE HEALTH INSURANCE | Admitting: Family Medicine

## 2018-08-20 VITALS — BP 132/80 | HR 72 | Temp 98.1°F | Resp 20 | Ht 68.0 in | Wt 197.0 lb

## 2018-08-20 DIAGNOSIS — S61411A Laceration without foreign body of right hand, initial encounter: Secondary | ICD-10-CM | POA: Diagnosis not present

## 2018-08-20 DIAGNOSIS — Z23 Encounter for immunization: Secondary | ICD-10-CM

## 2018-08-20 NOTE — Addendum Note (Signed)
Addended by: Leota Jacobsen on: 08/20/2018 02:52 PM   Modules accepted: Orders

## 2018-08-20 NOTE — Patient Instructions (Signed)
Watch for redness or swelling at the wound.  Watch for oozing of discharge/pus from the wound.   Return if any of these things occur.

## 2018-08-20 NOTE — Progress Notes (Signed)
OFFICE VISIT  08/20/2018   CC:  Chief Complaint  Patient presents with  . Laceration    left hand   HPI:    Patient is a 67 y.o. Caucasian male who presents for hand laceration. Happened 3 d/a, cut it on metal knife of a bottle opening.  He cleaned it out, put pressure dressing on it and it has done ok. Using it today ---has not bled at all.  No signif pain at this time.  Past Medical History:  Diagnosis Date  . Chronic renal insufficiency, stage II (mild)    CrCl 60s  . Erectile dysfunction   . Hearing impaired   . Herpes zoster    R side of face  . Hyperlipidemia   . Hypertension   . Impaired fasting glucose 2013-2015  . Personal history of kidney stones   . Recurrent herpes labialis     Past Surgical History:  Procedure Laterality Date  . COLONOSCOPY  2002; 2014   Repeat 2024  . LITHOTRIPSY      Outpatient Medications Prior to Visit  Medication Sig Dispense Refill  . amLODipine (NORVASC) 5 MG tablet TAKE ONE TABLET BY MOUTH DAILY 90 tablet 0  . aspirin 81 MG tablet Take 81 mg by mouth daily.    Marland Kitchen atorvastatin (LIPITOR) 20 MG tablet TAKE ONE TABLET BY MOUTH DAILY 90 tablet 0  . chlorthalidone (HYGROTON) 25 MG tablet TAKE ONE TABLET BY MOUTH DAILY 90 tablet 0  . sildenafil (VIAGRA) 100 MG tablet TAKE 1/2-1 TABLET BY MOUTH DAILY 30 MINUTES PRIOR TO INTERCOURSE. 6 tablet 5  . HYDROcodone-acetaminophen (NORCO/VICODIN) 5-325 MG tablet Take 1-2 tablets by mouth every 6 (six) hours as needed for moderate pain. 20 tablet 0   No facility-administered medications prior to visit.     Allergies  Allergen Reactions  . Atenolol     bradycarda- heart rate down to 38 bpm  . Demerol Nausea And Vomiting    vomiting    ROS As per HPI  PE: Blood pressure 132/80, pulse 72, temperature 98.1 F (36.7 C), resp. rate 20, height 5\' 8"  (1.727 m), weight 197 lb (89.4 kg), SpO2 96 %. Gen: Alert, well appearing.  Patient is oriented to person, place, time, and situation. AFFECT:  pleasant, lucid thought and speech. Left hand, web space between thumb and index finger with 4-5 cm shallow, linear laceration.  Granulation tissue beginning to form.  Non tender.  No exudate or fowl odor.  No surrounding erythema.  No streaking.  Wound edges are aobut 1 mm apart.     LABS:    Chemistry      Component Value Date/Time   NA 138 11/06/2017 0940   K 4.0 11/06/2017 0940   CL 101 11/06/2017 0940   CO2 28 11/06/2017 0940   BUN 20 11/06/2017 0940   CREATININE 1.11 11/06/2017 0940   CREATININE 1.01 03/14/2014 0842      Component Value Date/Time   CALCIUM 9.6 11/06/2017 0940   ALKPHOS 60 11/06/2017 0940   AST 22 11/06/2017 0940   ALT 20 11/06/2017 0940   BILITOT 0.8 11/06/2017 0940      IMPRESSION AND PLAN:  Left hand laceration with sharp metal object (clean): wound looks good.  Too late to suture or glue closed. It will heal fine by secondary intention.  No sign of infection.  Continue to wrap for comfort after applying neosporin 1-2 times daily.   Tdap given today.  Flu vaccine given today. Instructions: Watch for redness  or swelling at the wound.  Watch for oozing of discharge/pus from the wound.   Return if any of these things occur.  An After Visit Summary was printed and given to the patient.  FOLLOW UP: Return if symptoms worsen or fail to improve.  Signed:  Crissie Sickles, MD           08/20/2018

## 2018-09-05 ENCOUNTER — Ambulatory Visit: Payer: PRIVATE HEALTH INSURANCE | Admitting: Family Medicine

## 2018-10-18 ENCOUNTER — Other Ambulatory Visit: Payer: Self-pay | Admitting: Family Medicine

## 2018-10-18 DIAGNOSIS — I10 Essential (primary) hypertension: Secondary | ICD-10-CM

## 2018-11-07 ENCOUNTER — Ambulatory Visit (INDEPENDENT_AMBULATORY_CARE_PROVIDER_SITE_OTHER): Payer: PRIVATE HEALTH INSURANCE | Admitting: Family Medicine

## 2018-11-07 ENCOUNTER — Encounter: Payer: Self-pay | Admitting: Family Medicine

## 2018-11-07 VITALS — BP 144/80 | HR 52 | Temp 97.5°F | Resp 16 | Ht 68.5 in | Wt 202.0 lb

## 2018-11-07 DIAGNOSIS — Z125 Encounter for screening for malignant neoplasm of prostate: Secondary | ICD-10-CM | POA: Diagnosis not present

## 2018-11-07 DIAGNOSIS — R7301 Impaired fasting glucose: Secondary | ICD-10-CM

## 2018-11-07 DIAGNOSIS — Z0000 Encounter for general adult medical examination without abnormal findings: Secondary | ICD-10-CM

## 2018-11-07 DIAGNOSIS — E7800 Pure hypercholesterolemia, unspecified: Secondary | ICD-10-CM

## 2018-11-07 DIAGNOSIS — I10 Essential (primary) hypertension: Secondary | ICD-10-CM

## 2018-11-07 DIAGNOSIS — E669 Obesity, unspecified: Secondary | ICD-10-CM

## 2018-11-07 NOTE — Progress Notes (Signed)
Office Note 11/07/2018  CC:  Chief Complaint  Patient presents with  . Annual Exam    Pt is not fasting.     HPI:  Leroy Proctor is a 67 y.o. White male who is here for annual health maintenance exam.  Pt is concerned about his blood pressure: he went to podiatrist 3 wks ago and bp was 128/78. Has been over-indulging in food and alcohol with holidays and a recent vacation.   Works out at [employer] most days.  BP there 2 d/a was 180 syst, yesterday syst 172.  diastolics 40J. He feels well.  Takes bp meds daily. Started taking meloxicam 7.5mg  daily about 6 wks ago for achilles tendonitis. No otc decongestant.     Past Medical History:  Diagnosis Date  . Chronic renal insufficiency, stage II (mild)    CrCl 60s  . Erectile dysfunction   . Hearing impaired   . Herpes zoster    R side of face  . Hyperlipidemia   . Hypertension   . Impaired fasting glucose 2013-2015  . Personal history of kidney stones   . Recurrent herpes labialis     Past Surgical History:  Procedure Laterality Date  . COLONOSCOPY  2002; 2014   Repeat 2024  . LITHOTRIPSY      Family History  Problem Relation Age of Onset  . Colon cancer Neg Hx     Social History   Socioeconomic History  . Marital status: Married    Spouse name: Not on file  . Number of children: Not on file  . Years of education: Not on file  . Highest education level: Not on file  Occupational History  . Not on file  Social Needs  . Financial resource strain: Not on file  . Food insecurity:    Worry: Not on file    Inability: Not on file  . Transportation needs:    Medical: Not on file    Non-medical: Not on file  Tobacco Use  . Smoking status: Former Research scientist (life sciences)  . Smokeless tobacco: Never Used  Substance and Sexual Activity  . Alcohol use: Yes    Alcohol/week: 2.0 standard drinks    Types: 1 Glasses of wine, 1 Cans of beer per week  . Drug use: No  . Sexual activity: Not on file  Lifestyle  . Physical activity:     Days per week: Not on file    Minutes per session: Not on file  . Stress: Not on file  Relationships  . Social connections:    Talks on phone: Not on file    Gets together: Not on file    Attends religious service: Not on file    Active member of club or organization: Not on file    Attends meetings of clubs or organizations: Not on file    Relationship status: Not on file  . Intimate partner violence:    Fear of current or ex partner: Not on file    Emotionally abused: Not on file    Physically abused: Not on file    Forced sexual activity: Not on file  Other Topics Concern  . Not on file  Social History Narrative   Married, no children.   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   No T/A/Ds.   Exercise: stationary bike/nautilus 5 days a week.          Outpatient Medications Prior to Visit  Medication Sig Dispense Refill  . amLODipine (NORVASC) 5  MG tablet TAKE ONE TABLET BY MOUTH DAILY 90 tablet 0  . aspirin 81 MG tablet Take 81 mg by mouth daily.    Marland Kitchen atorvastatin (LIPITOR) 20 MG tablet TAKE ONE TABLET BY MOUTH DAILY 90 tablet 0  . chlorthalidone (HYGROTON) 25 MG tablet TAKE ONE TABLET BY MOUTH DAILY 90 tablet 0  . meloxicam (MOBIC) 7.5 MG tablet Take 1 tablet by mouth daily.    . sildenafil (VIAGRA) 100 MG tablet TAKE 1/2-1 TABLET BY MOUTH DAILY 30 MINUTES PRIOR TO INTERCOURSE. 6 tablet 5   No facility-administered medications prior to visit.     Allergies  Allergen Reactions  . Atenolol     bradycarda- heart rate down to 38 bpm  . Demerol Nausea And Vomiting    vomiting    ROS Review of Systems  Constitutional: Negative for appetite change, chills, fatigue and fever.  HENT: Negative for congestion, dental problem, ear pain and sore throat.   Eyes: Negative for discharge, redness and visual disturbance.  Respiratory: Negative for cough, chest tightness, shortness of breath and wheezing.   Cardiovascular: Negative for chest pain, palpitations and leg  swelling.  Gastrointestinal: Negative for abdominal pain, blood in stool, diarrhea, nausea and vomiting.  Genitourinary: Negative for difficulty urinating, dysuria, flank pain, frequency, hematuria and urgency.  Musculoskeletal: Negative for arthralgias, back pain, joint swelling, myalgias and neck stiffness.       Heel pain: dx'd with achilles tendonitis by sports med/podiatry 6 wks ago  Skin: Negative for pallor and rash.  Neurological: Negative for dizziness, speech difficulty, weakness and headaches.  Hematological: Negative for adenopathy. Does not bruise/bleed easily.  Psychiatric/Behavioral: Negative for confusion and sleep disturbance. The patient is not nervous/anxious.     PE;Initial bp today 151/69.  Repeat manual bp later in visit was 144/80. Blood pressure (!) 144/80, pulse (!) 52, temperature (!) 97.5 F (36.4 C), temperature source Oral, resp. rate 16, height 5' 8.5" (1.74 m), weight 202 lb (91.6 kg), SpO2 96 %. Gen: Alert, well appearing.  Patient is oriented to person, place, time, and situation. AFFECT: pleasant, lucid thought and speech. ENT: Ears: EACs clear, normal epithelium.  TMs with good light reflex and landmarks bilaterally.  Eyes: no injection, icteris, swelling, or exudate.  EOMI, PERRLA. Nose: no drainage or turbinate edema/swelling.  No injection or focal lesion.  Mouth: lips without lesion/swelling.  Oral mucosa pink and moist.  Dentition intact and without obvious caries or gingival swelling.  Oropharynx without erythema, exudate, or swelling.  Neck: supple/nontender.  No LAD, mass, or TM.  Carotid pulses 2+ bilaterally, without bruits. CV: RRR, no m/r/g.   LUNGS: CTA bilat, nonlabored resps, good aeration in all lung fields. ABD: soft, NT, ND, BS normal.  No hepatospenomegaly or mass.  No bruits. EXT: no clubbing, cyanosis, or edema.  Musculoskeletal: no joint swelling, erythema, warmth, or tenderness.  ROM of all joints intact. Skin - no sores or suspicious  lesions or rashes or color changes Rectal exam: negative without mass, lesions or tenderness, PROSTATE EXAM: smooth and symmetric without nodules or tenderness.   Pertinent labs:  Lab Results  Component Value Date   TSH 0.76 11/03/2016   Lab Results  Component Value Date   WBC 5.1 11/06/2017   HGB 15.2 11/06/2017   HCT 45.6 11/06/2017   MCV 89.4 11/06/2017   PLT 277.0 11/06/2017   Lab Results  Component Value Date   CREATININE 1.11 11/06/2017   BUN 20 11/06/2017   NA 138 11/06/2017   K  4.0 11/06/2017   CL 101 11/06/2017   CO2 28 11/06/2017   Lab Results  Component Value Date   ALT 20 11/06/2017   AST 22 11/06/2017   ALKPHOS 60 11/06/2017   BILITOT 0.8 11/06/2017   Lab Results  Component Value Date   CHOL 194 11/06/2017   Lab Results  Component Value Date   HDL 58.90 11/06/2017   Lab Results  Component Value Date   LDLCALC 119 (H) 11/06/2017   Lab Results  Component Value Date   TRIG 80.0 11/06/2017   Lab Results  Component Value Date   CHOLHDL 3 11/06/2017   Lab Results  Component Value Date   PSA 0.63 11/06/2017   PSA 0.34 11/03/2016   PSA 0.34 11/03/2015   Lab Results  Component Value Date   HGBA1C 5.9 11/06/2017    ASSESSMENT AND PLAN:    1) HTN, historically well controlled but he has noted an increase in the last 1-2 wks. He started meloxicam daily about 6 wks ago.  He has been eating poorly and drinking more alcohol lately. He'll get on better diet and cut back on alcohol. He'll stop meloxicam to see if this is making his bp go up. Instructions: Stop your meloxicam for the next 10 days. Check blood pressure and heart rate daily and write the numbers down. Call our office (or send MyChart message) in 10d to report these numbers. Goal bp is <130 over <80.   2) Health maintenance exam: Reviewed age and gender appropriate health maintenance issues (prudent diet, regular exercise, health risks of tobacco and excessive alcohol, use of  seatbelts, fire alarms in home, use of sunscreen).  Also reviewed age and gender appropriate health screening as well as vaccine recommendations. Vaccines: all UTD, including shingrix. Labs: fasting health panel (HTN, HLD, IFG)+ PSA-->ordered future b/c pt not fasting today. Prostate ca screening: DRE normal today , PSA with upcoming fasting labs. Colon ca screening: next colonoscopy due 2024.  An After Visit Summary was printed and given to the patient.  FOLLOW UP:  Return in about 6 months (around 05/09/2019) for routine chronic illness f/u.  Signed:  Crissie Sickles, MD           11/07/2018

## 2018-11-07 NOTE — Patient Instructions (Addendum)
Stop your meloxicam for the next 10 days. Check blood pressure and heart rate daily and write the numbers down. Call our office (or send MyChart message) in 10d to report these numbers. Goal bp is <130 over <80.   Health Maintenance, Male A healthy lifestyle and preventive care is important for your health and wellness. Ask your health care provider about what schedule of regular examinations is right for you. What should I know about weight and diet? Eat a Healthy Diet  Eat plenty of vegetables, fruits, whole grains, low-fat dairy products, and lean protein.  Do not eat a lot of foods high in solid fats, added sugars, or salt.  Maintain a Healthy Weight Regular exercise can help you achieve or maintain a healthy weight. You should:  Do at least 150 minutes of exercise each week. The exercise should increase your heart rate and make you sweat (moderate-intensity exercise).  Do strength-training exercises at least twice a week.  Watch Your Levels of Cholesterol and Blood Lipids  Have your blood tested for lipids and cholesterol every 5 years starting at 67 years of age. If you are at high risk for heart disease, you should start having your blood tested when you are 67 years old. You may need to have your cholesterol levels checked more often if: ? Your lipid or cholesterol levels are high. ? You are older than 67 years of age. ? You are at high risk for heart disease.  What should I know about cancer screening? Many types of cancers can be detected early and may often be prevented. Lung Cancer  You should be screened every year for lung cancer if: ? You are a current smoker who has smoked for at least 30 years. ? You are a former smoker who has quit within the past 15 years.  Talk to your health care provider about your screening options, when you should start screening, and how often you should be screened.  Colorectal Cancer  Routine colorectal cancer screening usually  begins at 67 years of age and should be repeated every 5-10 years until you are 67 years old. You may need to be screened more often if early forms of precancerous polyps or small growths are found. Your health care provider may recommend screening at an earlier age if you have risk factors for colon cancer.  Your health care provider may recommend using home test kits to check for hidden blood in the stool.  A small camera at the end of a tube can be used to examine your colon (sigmoidoscopy or colonoscopy). This checks for the earliest forms of colorectal cancer.  Prostate and Testicular Cancer  Depending on your age and overall health, your health care provider may do certain tests to screen for prostate and testicular cancer.  Talk to your health care provider about any symptoms or concerns you have about testicular or prostate cancer.  Skin Cancer  Check your skin from head to toe regularly.  Tell your health care provider about any new moles or changes in moles, especially if: ? There is a change in a mole's size, shape, or color. ? You have a mole that is larger than a pencil eraser.  Always use sunscreen. Apply sunscreen liberally and repeat throughout the day.  Protect yourself by wearing long sleeves, pants, a wide-brimmed hat, and sunglasses when outside.  What should I know about heart disease, diabetes, and high blood pressure?  If you are 93-10 years of age, have your  blood pressure checked every 3-5 years. If you are 86 years of age or older, have your blood pressure checked every year. You should have your blood pressure measured twice-once when you are at a hospital or clinic, and once when you are not at a hospital or clinic. Record the average of the two measurements. To check your blood pressure when you are not at a hospital or clinic, you can use: ? An automated blood pressure machine at a pharmacy. ? A home blood pressure monitor.  Talk to your health care  provider about your target blood pressure.  If you are between 16-43 years old, ask your health care provider if you should take aspirin to prevent heart disease.  Have regular diabetes screenings by checking your fasting blood sugar level. ? If you are at a normal weight and have a low risk for diabetes, have this test once every three years after the age of 31. ? If you are overweight and have a high risk for diabetes, consider being tested at a younger age or more often.  A one-time screening for abdominal aortic aneurysm (AAA) by ultrasound is recommended for men aged 87-75 years who are current or former smokers. What should I know about preventing infection? Hepatitis B If you have a higher risk for hepatitis B, you should be screened for this virus. Talk with your health care provider to find out if you are at risk for hepatitis B infection. Hepatitis C Blood testing is recommended for:  Everyone born from [birth year] through 1965.  Anyone with known risk factors for hepatitis C.  Sexually Transmitted Diseases (STDs)  You should be screened each year for STDs including gonorrhea and chlamydia if: ? You are sexually active and are younger than 67 years of age. ? You are older than 67 years of age and your health care provider tells you that you are at risk for this type of infection. ? Your sexual activity has changed since you were last screened and you are at an increased risk for chlamydia or gonorrhea. Ask your health care provider if you are at risk.  Talk with your health care provider about whether you are at high risk of being infected with HIV. Your health care provider may recommend a prescription medicine to help prevent HIV infection.  What else can I do?  Schedule regular health, dental, and eye exams.  Stay current with your vaccines (immunizations).  Do not use any tobacco products, such as cigarettes, chewing tobacco, and e-cigarettes. If you need help quitting, ask  your health care provider.  Limit alcohol intake to no more than 2 drinks per day. One drink equals 12 ounces of beer, 5 ounces of wine, or 1 ounces of hard liquor.  Do not use street drugs.  Do not share needles.  Ask your health care provider for help if you need support or information about quitting drugs.  Tell your health care provider if you often feel depressed.  Tell your health care provider if you have ever been abused or do not feel safe at home. This information is not intended to replace advice given to you by your health care provider. Make sure you discuss any questions you have with your health care provider. Document Released: 05/19/2008 Document Revised: 07/20/2016 Document Reviewed: 08/25/2015 Elsevier Interactive Patient Education  [employer].

## 2018-11-26 DIAGNOSIS — N5203 Combined arterial insufficiency and corporo-venous occlusive erectile dysfunction: Secondary | ICD-10-CM

## 2018-11-26 HISTORY — DX: Combined arterial insufficiency and corporo-venous occlusive erectile dysfunction: N52.03

## 2018-12-20 ENCOUNTER — Other Ambulatory Visit (INDEPENDENT_AMBULATORY_CARE_PROVIDER_SITE_OTHER): Payer: PRIVATE HEALTH INSURANCE

## 2018-12-20 DIAGNOSIS — Z125 Encounter for screening for malignant neoplasm of prostate: Secondary | ICD-10-CM | POA: Diagnosis not present

## 2018-12-20 DIAGNOSIS — I10 Essential (primary) hypertension: Secondary | ICD-10-CM

## 2018-12-20 DIAGNOSIS — E7800 Pure hypercholesterolemia, unspecified: Secondary | ICD-10-CM

## 2018-12-20 DIAGNOSIS — R7301 Impaired fasting glucose: Secondary | ICD-10-CM

## 2018-12-20 LAB — LIPID PANEL
CHOLESTEROL: 187 mg/dL (ref 0–200)
HDL: 52.7 mg/dL (ref >39.00)
LDL Cholesterol: 112 mg/dL — ABNORMAL HIGH (ref 0–99)
NonHDL: 134
Total CHOL/HDL Ratio: 4
Triglycerides: 108 mg/dL (ref 0.0–149.0)
VLDL: 21.6 mg/dL (ref 0.0–40.0)

## 2018-12-20 LAB — COMPREHENSIVE METABOLIC PANEL
ALT: 31 U/L (ref 0–53)
AST: 25 U/L (ref 0–37)
Albumin: 4.2 g/dL (ref 3.5–5.2)
Alkaline Phosphatase: 52 U/L (ref 39–117)
BUN: 22 mg/dL (ref 6–23)
CO2: 29 mEq/L (ref 19–32)
Calcium: 9.4 mg/dL (ref 8.4–10.5)
Chloride: 102 mEq/L (ref 96–112)
Creatinine, Ser: 1.02 mg/dL (ref 0.40–1.50)
GFR: 72.71 mL/min (ref >60.00)
Glucose, Bld: 98 mg/dL (ref 70–99)
Potassium: 4.1 mEq/L (ref 3.5–5.1)
Sodium: 138 mEq/L (ref 135–145)
Total Bilirubin: 0.9 mg/dL (ref 0.2–1.2)
Total Protein: 7.2 g/dL (ref 6.0–8.3)

## 2018-12-20 LAB — CBC WITH DIFFERENTIAL/PLATELET
Basophils Absolute: 0.1 10*3/uL (ref 0.0–0.1)
Basophils Relative: 1.1 % (ref 0.0–3.0)
Eosinophils Absolute: 0.2 10*3/uL (ref 0.0–0.7)
Eosinophils Relative: 4.7 % (ref 0.0–5.0)
HCT: 45.1 % (ref 39.0–52.0)
Hemoglobin: 15.3 g/dL (ref 13.0–17.0)
LYMPHS ABS: 1.2 10*3/uL (ref 0.7–4.0)
Lymphocytes Relative: 23.5 % (ref 12.0–46.0)
MCHC: 34 g/dL (ref 30.0–36.0)
MCV: 88.2 fl (ref 78.0–100.0)
Monocytes Absolute: 0.6 10*3/uL (ref 0.1–1.0)
Monocytes Relative: 11.1 % (ref 3.0–12.0)
Neutro Abs: 3 10*3/uL (ref 1.4–7.7)
Neutrophils Relative %: 59.6 % (ref 43.0–77.0)
Platelets: 236 10*3/uL (ref 150.0–400.0)
RBC: 5.11 Mil/uL (ref 4.22–5.81)
RDW: 13.4 % (ref 11.5–15.5)
WBC: 5.1 10*3/uL (ref 4.0–10.5)

## 2018-12-20 LAB — HEMOGLOBIN A1C: Hgb A1c MFr Bld: 5.9 % (ref 4.6–6.5)

## 2018-12-21 ENCOUNTER — Other Ambulatory Visit: Payer: PRIVATE HEALTH INSURANCE

## 2018-12-21 LAB — PSA, MEDICARE: PSA: 0.22 ng/ml (ref 0.10–4.00)

## 2019-01-15 ENCOUNTER — Other Ambulatory Visit: Payer: Self-pay | Admitting: Family Medicine

## 2019-01-15 ENCOUNTER — Encounter: Payer: Self-pay | Admitting: Family Medicine

## 2019-01-15 DIAGNOSIS — I10 Essential (primary) hypertension: Secondary | ICD-10-CM

## 2019-01-18 ENCOUNTER — Other Ambulatory Visit: Payer: Self-pay | Admitting: Family Medicine

## 2019-01-18 DIAGNOSIS — I10 Essential (primary) hypertension: Secondary | ICD-10-CM

## 2019-05-13 ENCOUNTER — Ambulatory Visit: Payer: PRIVATE HEALTH INSURANCE | Admitting: Family Medicine

## 2019-05-20 NOTE — Progress Notes (Signed)
Virtual Visit via Video Note  I connected with pt on 05/21/19 at  8:30 AM EDT by a video enabled telemedicine application and verified that I am speaking with the correct person using two identifiers.  Location patient: home Location provider:work or home office Persons participating in the virtual visit: patient, provider  I discussed the limitations of evaluation and management by telemedicine and the availability of in person appointments. The patient expressed understanding and agreed to proceed.  Telemedicine visit is a necessity given the COVID-19 restrictions in place at the current time.  HPI: 68 y/o WM being seen today for 6 mo f/u HTN, HLD, and CRI with GFR approx 60 ml/min.  HTN: home bp monitoring not as frequent as he used to; 145/75.   Walking 5 miles per day.  Has had some shoe-wear adjustments for feet pain from this. Needs meloxicam RF, uses this qod at the most. He tries to hydrate well.  Avoids OTC NSAIDs. Tolerating statin and fibrate.  ROS: See pertinent positives and negatives per HPI.  Past Medical History:  Diagnosis Date  . Chronic renal insufficiency, stage II (mild)    CrCl 60s  . Erectile dysfunction   . Hearing impaired   . Herpes zoster    R side of face  . Hyperlipidemia   . Hypertension   . Impaired fasting glucose 2013-2015  . Personal history of kidney stones   . Recurrent herpes labialis     Past Surgical History:  Procedure Laterality Date  . COLONOSCOPY  2002; 2014   Repeat 2024  . LITHOTRIPSY      Family History  Problem Relation Age of Onset  . 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  No T/A/Ds.   Current Outpatient Medications:  .  amLODipine (NORVASC) 5 MG tablet, TAKE ONE TABLET BY MOUTH DAILY, Disp: 90 tablet, Rfl: 1 .  aspirin 81 MG tablet, Take 81 mg by mouth daily., Disp: , Rfl:  .  atorvastatin (LIPITOR) 20 MG tablet, TAKE ONE TABLET BY MOUTH  DAILY, Disp: 90 tablet, Rfl: 1 .  chlorthalidone (HYGROTON) 25 MG tablet, TAKE ONE TABLET BY MOUTH DAILY, Disp: 90 tablet, Rfl: 1 .  sildenafil (VIAGRA) 100 MG tablet, TAKE 1/2-1 TABLET BY MOUTH DAILY 30 MINUTES PRIOR TO INTERCOURSE., Disp: 6 tablet, Rfl: 5 .  meloxicam (MOBIC) 7.5 MG tablet, Take 1 tablet by mouth daily., Disp: , Rfl:   EXAM:  VITALS per patient if applicable: There were no vitals taken for this visit.   GENERAL: alert, oriented, appears well and in no acute distress  HEENT: atraumatic, conjunttiva clear, no obvious abnormalities on inspection of external nose and ears  NECK: normal movements of the head and neck  LUNGS: on inspection no signs of respiratory distress, breathing rate appears normal, no obvious gross SOB, gasping or wheezing  CV: no obvious cyanosis  MS: moves all visible extremities without noticeable abnormality  PSYCH/NEURO: pleasant and cooperative, no obvious depression or anxiety, speech and thought processing grossly intact  LABS: none today  Lab Results  Component Value Date   TSH 0.76 11/03/2016   Lab Results  Component Value Date   WBC 5.1 12/20/2018   HGB 15.3 12/20/2018   HCT 45.1 12/20/2018   MCV 88.2 12/20/2018   PLT 236.0 12/20/2018   Lab Results  Component Value Date   CREATININE 1.02 12/20/2018   BUN 22 12/20/2018  NA 138 12/20/2018   K 4.1 12/20/2018   CL 102 12/20/2018   CO2 29 12/20/2018   Lab Results  Component Value Date   ALT 31 12/20/2018   AST 25 12/20/2018   ALKPHOS 52 12/20/2018   BILITOT 0.9 12/20/2018   Lab Results  Component Value Date   CHOL 187 12/20/2018   Lab Results  Component Value Date   HDL 52.70 12/20/2018   Lab Results  Component Value Date   LDLCALC 112 (H) 12/20/2018   Lab Results  Component Value Date   TRIG 108.0 12/20/2018   Lab Results  Component Value Date   CHOLHDL 4 12/20/2018   Lab Results  Component Value Date   PSA 0.22 12/20/2018   PSA 0.63 11/06/2017    PSA 0.34 11/03/2016   Lab Results  Component Value Date   HGBA1C 5.9 12/20/2018    ASSESSMENT AND PLAN:  Discussed the following assessment and plan:  1) HTN: not well controlled.  Goal 130/80. Increase amlodipine to 10mg  qd.  Get back into good CV exercise habits at [facility], continue walking until then. Lytes/cr future.  Continue home bp monitoring.,  2) HLD: tolerating statin.  FLP-future.  3) CRI: Will continue him on qod prn meloxicam b/c it brings good benefit. Will monitor renal function closely, bp as well. bmet--future.   4) IFG: HbA1c-future. Needs to work on exercies/diet/wt loss.  I discussed the assessment and treatment plan with the patient. The patient was provided an opportunity to ask questions and all were answered. The patient agreed with the plan and demonstrated an understanding of the instructions.   The patient was advised to call back or seek an in-person evaluation if the symptoms worsen or if the condition fails to improve as anticipated.  F/u: 6 mo CPE  Signed:  Crissie Sickles, MD           05/21/2019

## 2019-05-21 ENCOUNTER — Encounter: Payer: Self-pay | Admitting: Family Medicine

## 2019-05-21 ENCOUNTER — Ambulatory Visit (INDEPENDENT_AMBULATORY_CARE_PROVIDER_SITE_OTHER): Payer: PRIVATE HEALTH INSURANCE | Admitting: Family Medicine

## 2019-05-21 ENCOUNTER — Other Ambulatory Visit: Payer: Self-pay

## 2019-05-21 DIAGNOSIS — E7800 Pure hypercholesterolemia, unspecified: Secondary | ICD-10-CM

## 2019-05-21 DIAGNOSIS — R7301 Impaired fasting glucose: Secondary | ICD-10-CM

## 2019-05-21 DIAGNOSIS — I10 Essential (primary) hypertension: Secondary | ICD-10-CM | POA: Diagnosis not present

## 2019-05-21 MED ORDER — MELOXICAM 7.5 MG PO TABS: ORAL_TABLET | ORAL | 1 refills | Status: DC

## 2019-05-21 MED ORDER — AMLODIPINE BESYLATE 10 MG PO TABS: 10.0000 mg | ORAL_TABLET | Freq: Every day | ORAL | 3 refills | Status: DC

## 2019-05-22 ENCOUNTER — Ambulatory Visit: Payer: PRIVATE HEALTH INSURANCE

## 2019-05-29 ENCOUNTER — Ambulatory Visit (INDEPENDENT_AMBULATORY_CARE_PROVIDER_SITE_OTHER): Payer: PRIVATE HEALTH INSURANCE | Admitting: Family Medicine

## 2019-05-29 ENCOUNTER — Other Ambulatory Visit: Payer: Self-pay

## 2019-05-29 DIAGNOSIS — I10 Essential (primary) hypertension: Secondary | ICD-10-CM | POA: Diagnosis not present

## 2019-05-29 DIAGNOSIS — E7800 Pure hypercholesterolemia, unspecified: Secondary | ICD-10-CM

## 2019-05-29 DIAGNOSIS — R7301 Impaired fasting glucose: Secondary | ICD-10-CM | POA: Diagnosis not present

## 2019-05-29 LAB — BASIC METABOLIC PANEL
BUN: 24 mg/dL — ABNORMAL HIGH (ref 6–23)
CO2: 28 mEq/L (ref 19–32)
Calcium: 9.2 mg/dL (ref 8.4–10.5)
Chloride: 102 mEq/L (ref 96–112)
Creatinine, Ser: 0.96 mg/dL (ref 0.40–1.50)
GFR: 77.88 mL/min (ref >60.00)
Glucose, Bld: 103 mg/dL — ABNORMAL HIGH (ref 70–99)
Potassium: 4.2 mEq/L (ref 3.5–5.1)
Sodium: 137 mEq/L (ref 135–145)

## 2019-05-29 LAB — LIPID PANEL
Cholesterol: 178 mg/dL (ref 0–200)
HDL: 55 mg/dL (ref >39.00)
LDL Cholesterol: 108 mg/dL — ABNORMAL HIGH (ref 0–99)
NonHDL: 123.47
Total CHOL/HDL Ratio: 3
Triglycerides: 79 mg/dL (ref 0.0–149.0)
VLDL: 15.8 mg/dL (ref 0.0–40.0)

## 2019-05-30 LAB — HEMOGLOBIN A1C: Hgb A1c MFr Bld: 5.9 % (ref 4.6–6.5)

## 2019-07-16 ENCOUNTER — Other Ambulatory Visit: Payer: Self-pay | Admitting: Family Medicine

## 2019-07-16 DIAGNOSIS — I10 Essential (primary) hypertension: Secondary | ICD-10-CM

## 2019-07-17 ENCOUNTER — Telehealth: Payer: Self-pay

## 2019-07-17 NOTE — Telephone Encounter (Signed)
Received voicemail that one of his medication (Amlodipine) were refused for refill. His last visit 6/16, this medication was increased to 10mg . The request for 5mg  was denied. Pt was notified 10mg  sent (90,3) on 05/21/19.

## 2019-08-20 ENCOUNTER — Telehealth: Payer: Self-pay

## 2019-08-20 NOTE — Telephone Encounter (Signed)
Received fax requesting PCP response if pt cleared to start exercise program.  PCP will review and sign, if appropriate.

## 2019-08-21 NOTE — Telephone Encounter (Signed)
OK letter done

## 2019-08-21 NOTE — Telephone Encounter (Signed)
Forms placed in bin to go up front for fax.

## 2019-11-12 ENCOUNTER — Other Ambulatory Visit: Payer: Self-pay

## 2019-11-13 ENCOUNTER — Encounter: Payer: Self-pay | Admitting: Family Medicine

## 2019-11-13 ENCOUNTER — Other Ambulatory Visit: Payer: Self-pay

## 2019-11-13 ENCOUNTER — Ambulatory Visit (INDEPENDENT_AMBULATORY_CARE_PROVIDER_SITE_OTHER): Payer: PRIVATE HEALTH INSURANCE | Admitting: Family Medicine

## 2019-11-13 VITALS — BP 138/81 | Ht 68.5 in | Wt 191.0 lb

## 2019-11-13 DIAGNOSIS — Z0000 Encounter for general adult medical examination without abnormal findings: Secondary | ICD-10-CM | POA: Diagnosis not present

## 2019-11-13 DIAGNOSIS — Z125 Encounter for screening for malignant neoplasm of prostate: Secondary | ICD-10-CM

## 2019-11-13 DIAGNOSIS — R7303 Prediabetes: Secondary | ICD-10-CM

## 2019-11-13 DIAGNOSIS — I10 Essential (primary) hypertension: Secondary | ICD-10-CM

## 2019-11-13 DIAGNOSIS — E7800 Pure hypercholesterolemia, unspecified: Secondary | ICD-10-CM

## 2019-11-13 NOTE — Progress Notes (Signed)
Virtual Visit via Video Note    I connected with pt on 11/13/19 at  8:30 AM EST by a video enabled telemedicine application and verified that I am speaking with the correct person using two identifiers.  Location patient: home Location provider:work or home office Persons participating in the virtual visit: patient, provider  I discussed the limitations of evaluation and management by telemedicine and the availability of in person appointments. The patient expressed understanding and agreed to proceed.  Telemedicine visit is a necessity given the COVID-19 restrictions in place at the current time.  HPI: 68 y/o WM being seen today for annual health maintenance exam.  BP Home bp monitoring: up and down, wrist cuff, ? Accuracy.  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 Diet: fairly healthy.  Needs for fruit. No red meat.  His ortho MD has advised him to increased his meloxicam to 15mg  qd prn for his ongoing heel/achilles tendonitis pain.  ROS: See pertinent positives and negatives per HPI.  Past Medical History:  Diagnosis Date  . Chronic renal insufficiency, stage II (mild)    CrCl 60s  . Erectile dysfunction   . Hearing impaired   . Herpes zoster    R side of face  . Hyperlipidemia   . Hypertension   . Impaired fasting glucose 2013-2015  . Personal history of kidney stones   . Recurrent herpes labialis     Past Surgical History:  Procedure Laterality Date  . COLONOSCOPY  2002; 2014   Repeat 2024  . LITHOTRIPSY      Family History  Problem Relation Age of Onset  . Colon cancer Neg Hx     SOCIAL HX:  Social History   Socioeconomic History  . Marital status: Married    Spouse name: Not on file  . Number of children: Not on file  . Years of education: Not on file  . Highest education level: Not on file  Occupational History  . Not on file  Social Needs  . Financial resource strain: Not on file  . Food insecurity    Worry:  Not on file    Inability: Not on file  . Transportation needs    Medical: Not on file    Non-medical: Not on file  Tobacco Use  . Smoking status: Former Research scientist (life sciences)  . Smokeless tobacco: Never Used  Substance and Sexual Activity  . Alcohol use: Yes    Alcohol/week: 2.0 standard drinks    Types: 1 Glasses of wine, 1 Cans of beer per week  . Drug use: No  . Sexual activity: Not on file  Lifestyle  . Physical activity    Days per week: Not on file    Minutes per session: Not on file  . Stress: Not on file  Relationships  . Social Herbalist on phone: Not on file    Gets together: Not on file    Attends religious service: Not on file    Active member of club or organization: Not on file    Attends meetings of clubs or organizations: Not on file    Relationship status: Not on file  Other Topics Concern  . Not on file  Social History Narrative   Married, no children.   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   No T/A/Ds.   Exercise: stationary bike/nautilus 5 days a week.         Review of Systems  Constitutional:  Negative for appetite change, chills, fatigue and fever.  HENT: Negative for congestion, dental problem, ear pain and sore throat.   Eyes: Negative for discharge, redness and visual disturbance.  Respiratory: Negative for cough, chest tightness, shortness of breath and wheezing.   Cardiovascular: Negative for chest pain, palpitations and leg swelling.  Gastrointestinal: Negative for abdominal pain, blood in stool, diarrhea, nausea and vomiting.  Genitourinary: Negative for difficulty urinating, dysuria, flank pain, frequency, hematuria and urgency.  Musculoskeletal: Negative for arthralgias, back pain, joint swelling, myalgias and neck stiffness.  Skin: Negative for pallor and rash.  Neurological: Negative for dizziness, speech difficulty, weakness and headaches.  Hematological: Negative for adenopathy. Does not bruise/bleed easily.   Psychiatric/Behavioral: Negative for confusion and sleep disturbance. The patient is not nervous/anxious.       Current Outpatient Medications:  .  amLODipine (NORVASC) 10 MG tablet, Take 1 tablet (10 mg total) by mouth daily., Disp: 90 tablet, Rfl: 3 .  aspirin 81 MG tablet, Take 81 mg by mouth daily., Disp: , Rfl:  .  atorvastatin (LIPITOR) 20 MG tablet, TAKE ONE TABLET BY MOUTH DAILY, Disp: 90 tablet, Rfl: 1 .  chlorthalidone (HYGROTON) 25 MG tablet, TAKE ONE TABLET BY MOUTH DAILY, Disp: 90 tablet, Rfl: 1 .  meloxicam (MOBIC) 15 MG tablet, , Disp: , Rfl:  .  sildenafil (VIAGRA) 100 MG tablet, TAKE 1/2-1 TABLET BY MOUTH DAILY 30 MINUTES PRIOR TO INTERCOURSE., Disp: 6 tablet, Rfl: 5  EXAM:  VITALS per patient if applicable: BP 123456 (BP Location: Left Arm, Patient Position: Sitting, Cuff Size: Large)   Ht 5' 8.5" (1.74 m)   Wt 191 lb (86.6 kg)   BMI 28.62 kg/m    GENERAL: alert, oriented, appears well and in no acute distress  HEENT: atraumatic, conjunttiva clear, no obvious abnormalities on inspection of external nose and ears  NECK: normal movements of the head and neck  LUNGS: on inspection no signs of respiratory distress, breathing rate appears normal, no obvious gross SOB, gasping or wheezing  CV: no obvious cyanosis  MS: moves all visible extremities without noticeable abnormality  PSYCH/NEURO: pleasant and cooperative, no obvious depression or anxiety, speech and thought processing grossly intact  LABS: none today  Lab Results  Component Value Date   TSH 0.76 11/03/2016   Lab Results  Component Value Date   WBC 5.1 12/20/2018   HGB 15.3 12/20/2018   HCT 45.1 12/20/2018   MCV 88.2 12/20/2018   PLT 236.0 12/20/2018   Lab Results  Component Value Date   CREATININE 0.96 05/29/2019   BUN 24 (H) 05/29/2019   NA 137 05/29/2019   K 4.2 05/29/2019   CL 102 05/29/2019   CO2 28 05/29/2019   Lab Results  Component Value Date   ALT 31 12/20/2018   AST 25  12/20/2018   ALKPHOS 52 12/20/2018   BILITOT 0.9 12/20/2018   Lab Results  Component Value Date   CHOL 178 05/29/2019   Lab Results  Component Value Date   HDL 55.00 05/29/2019   Lab Results  Component Value Date   LDLCALC 108 (H) 05/29/2019   Lab Results  Component Value Date   TRIG 79.0 05/29/2019   Lab Results  Component Value Date   CHOLHDL 3 05/29/2019   Lab Results  Component Value Date   PSA 0.22 12/20/2018   PSA 0.63 11/06/2017   PSA 0.34 11/03/2016   Lab Results  Component Value Date   HGBA1C 5.9 05/29/2019    ASSESSMENT AND  PLAN:  Discussed the following assessment and plan:  Health maintenance exam: Reviewed age and gender appropriate health maintenance issues (prudent diet, regular exercise, health risks of tobacco and excessive alcohol, use of seatbelts, fire alarms in home, use of sunscreen).  Also reviewed age and gender appropriate health screening as well as vaccine recommendations. Vaccines: all UTD, including seasonal flu. Labs: CBC w/diff, CMET, FLP, A1c (hyperlip, HTN, IFG)--future when fasting. Prostate ca screening: DRE deferred for now since this is a virtual visit , PSA ordered. Colon ca screening:  Next colonoscopy due 2024.  Discussed ASA: lack of adequate evidence to support use of ASA for primary prevention, + potential for GI/other bleeding.  Encouraged him to d/c this med at this time, esp since he is taking meloxicam daily for his heel pain.  -we discussed possible serious and likely etiologies, options for evaluation and workup, limitations of telemedicine visit vs in person visit, treatment, treatment risks and precautions. Pt prefers to treat via telemedicine empirically rather then risking or undertaking an in person visit at this moment. Patient agrees to seek prompt in person care if worsening, new symptoms arise, or if is not improving with treatment.   I discussed the assessment and treatment plan with the patient. The patient  was provided an opportunity to ask questions and all were answered. The patient agreed with the plan and demonstrated an understanding of the instructions.   The patient was advised to call back or seek an in-person evaluation if the symptoms worsen or if the condition fails to improve as anticipated.  F/u: 6 mo  Signed:  Crissie Sickles, MD           11/13/2019

## 2019-11-14 ENCOUNTER — Ambulatory Visit (INDEPENDENT_AMBULATORY_CARE_PROVIDER_SITE_OTHER): Payer: PRIVATE HEALTH INSURANCE | Admitting: Family Medicine

## 2019-11-14 DIAGNOSIS — I10 Essential (primary) hypertension: Secondary | ICD-10-CM | POA: Diagnosis not present

## 2019-11-14 DIAGNOSIS — Z125 Encounter for screening for malignant neoplasm of prostate: Secondary | ICD-10-CM

## 2019-11-14 DIAGNOSIS — R7303 Prediabetes: Secondary | ICD-10-CM

## 2019-11-14 DIAGNOSIS — E7800 Pure hypercholesterolemia, unspecified: Secondary | ICD-10-CM

## 2019-11-14 LAB — LIPID PANEL
Cholesterol: 179 mg/dL (ref 0–200)
HDL: 53.2 mg/dL (ref >39.00)
LDL Cholesterol: 113 mg/dL — ABNORMAL HIGH (ref 0–99)
NonHDL: 125.75
Total CHOL/HDL Ratio: 3
Triglycerides: 65 mg/dL (ref 0.0–149.0)
VLDL: 13 mg/dL (ref 0.0–40.0)

## 2019-11-14 LAB — CBC WITH DIFFERENTIAL/PLATELET
Basophils Absolute: 0.1 10*3/uL (ref 0.0–0.1)
Basophils Relative: 1.3 % (ref 0.0–3.0)
Eosinophils Absolute: 0.3 10*3/uL (ref 0.0–0.7)
Eosinophils Relative: 4.4 % (ref 0.0–5.0)
HCT: 44.8 % (ref 39.0–52.0)
Hemoglobin: 14.7 g/dL (ref 13.0–17.0)
Lymphocytes Relative: 23.5 % (ref 12.0–46.0)
Lymphs Abs: 1.5 10*3/uL (ref 0.7–4.0)
MCHC: 32.8 g/dL (ref 30.0–36.0)
MCV: 89 fl (ref 78.0–100.0)
Monocytes Absolute: 0.7 10*3/uL (ref 0.1–1.0)
Monocytes Relative: 11 % (ref 3.0–12.0)
Neutro Abs: 3.8 10*3/uL (ref 1.4–7.7)
Neutrophils Relative %: 59.8 % (ref 43.0–77.0)
Platelets: 281 10*3/uL (ref 150.0–400.0)
RBC: 5.04 Mil/uL (ref 4.22–5.81)
RDW: 14 % (ref 11.5–15.5)
WBC: 6.3 10*3/uL (ref 4.0–10.5)

## 2019-11-14 LAB — COMPREHENSIVE METABOLIC PANEL
ALT: 22 U/L (ref 0–53)
AST: 21 U/L (ref 0–37)
Albumin: 4.3 g/dL (ref 3.5–5.2)
Alkaline Phosphatase: 53 U/L (ref 39–117)
BUN: 24 mg/dL — ABNORMAL HIGH (ref 6–23)
CO2: 26 mEq/L (ref 19–32)
Calcium: 9.3 mg/dL (ref 8.4–10.5)
Chloride: 103 mEq/L (ref 96–112)
Creatinine, Ser: 1.07 mg/dL (ref 0.40–1.50)
GFR: 68.62 mL/min (ref >60.00)
Glucose, Bld: 97 mg/dL (ref 70–99)
Potassium: 4.1 mEq/L (ref 3.5–5.1)
Sodium: 138 mEq/L (ref 135–145)
Total Bilirubin: 1.2 mg/dL (ref 0.2–1.2)
Total Protein: 7.2 g/dL (ref 6.0–8.3)

## 2019-11-14 LAB — PSA, MEDICARE: PSA: 0.34 ng/ml (ref 0.10–4.00)

## 2019-11-14 LAB — HEMOGLOBIN A1C: Hgb A1c MFr Bld: 5.7 % (ref 4.6–6.5)

## 2019-11-23 ENCOUNTER — Other Ambulatory Visit: Payer: Self-pay | Admitting: Family Medicine

## 2019-11-25 ENCOUNTER — Telehealth: Payer: Self-pay

## 2019-11-25 NOTE — Telephone Encounter (Signed)
Patient states he got a call from [facility] saying that we have denied his refill. Patient doesn't know which medication it is. Please call.

## 2019-11-25 NOTE — Telephone Encounter (Signed)
Patient advised that denial was for 7.5mg  dose of meloxicam. His ortho MD increased this med to 15mg .

## 2020-01-10 ENCOUNTER — Other Ambulatory Visit: Payer: Self-pay | Admitting: Family Medicine

## 2020-01-10 DIAGNOSIS — I10 Essential (primary) hypertension: Secondary | ICD-10-CM

## 2020-01-13 ENCOUNTER — Other Ambulatory Visit: Payer: Self-pay | Admitting: Family Medicine

## 2020-01-13 DIAGNOSIS — I10 Essential (primary) hypertension: Secondary | ICD-10-CM

## 2020-04-05 ENCOUNTER — Other Ambulatory Visit: Payer: Self-pay | Admitting: Family Medicine

## 2020-04-07 ENCOUNTER — Other Ambulatory Visit: Payer: Self-pay | Admitting: Family Medicine

## 2020-05-27 ENCOUNTER — Other Ambulatory Visit: Payer: Self-pay | Admitting: Family Medicine

## 2020-06-11 ENCOUNTER — Telehealth: Payer: Self-pay | Admitting: Family Medicine

## 2020-06-11 NOTE — Telephone Encounter (Signed)
Yes, this is fine with me.  

## 2020-06-11 NOTE — Telephone Encounter (Signed)
Patient would to request a TOC from [facility] to [facility] location of [place].   Please Advise

## 2020-06-12 NOTE — Telephone Encounter (Signed)
I am ok with transfer but pt needs to be aware I usually ask pt to come in for office exam and don't typically do virtual visit unless potential covid type symptoms present.  If he transfers care then 1st visit needs to be in office. Don't make transfer change in epic until the day he actually comes in.

## 2020-07-07 ENCOUNTER — Other Ambulatory Visit: Payer: Self-pay

## 2020-07-07 DIAGNOSIS — I10 Essential (primary) hypertension: Secondary | ICD-10-CM

## 2020-07-07 MED ORDER — ATORVASTATIN CALCIUM 20 MG PO TABS: 20.0000 mg | ORAL_TABLET | Freq: Every day | ORAL | 0 refills | Status: DC

## 2020-07-07 MED ORDER — CHLORTHALIDONE 25 MG PO TABS: 25.0000 mg | ORAL_TABLET | Freq: Every day | ORAL | 0 refills | Status: DC

## 2020-07-21 ENCOUNTER — Ambulatory Visit: Payer: PRIVATE HEALTH INSURANCE | Admitting: Medical

## 2020-07-21 ENCOUNTER — Other Ambulatory Visit: Payer: Self-pay

## 2020-07-21 ENCOUNTER — Encounter: Payer: Self-pay | Admitting: Medical

## 2020-07-21 VITALS — BP 137/75 | HR 64 | Resp 18 | Ht 68.0 in | Wt 188.4 lb

## 2020-07-21 DIAGNOSIS — H9191 Unspecified hearing loss, right ear: Secondary | ICD-10-CM

## 2020-07-21 DIAGNOSIS — E7800 Pure hypercholesterolemia, unspecified: Secondary | ICD-10-CM

## 2020-07-21 DIAGNOSIS — H6121 Impacted cerumen, right ear: Secondary | ICD-10-CM

## 2020-07-21 DIAGNOSIS — R7303 Prediabetes: Secondary | ICD-10-CM | POA: Diagnosis not present

## 2020-07-21 DIAGNOSIS — I10 Essential (primary) hypertension: Secondary | ICD-10-CM

## 2020-07-21 DIAGNOSIS — H9193 Unspecified hearing loss, bilateral: Secondary | ICD-10-CM

## 2020-07-21 NOTE — Progress Notes (Signed)
Subjective:    Patient ID: Leroy Proctor, male    DOB: 1951-04-27, 69 y.o.   MRN: [id number]  HPI Pt in for first time.  Former [employer] pt. Pt works in [employer] and about to retire. 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 Pt states he does get regular exercise. Pt currently get to go to [employer] center. Works out 5 days a week. For about one hour. States healthy diet. Nonsmoker. Drinks 12 pack of beer but states spread out over a week.   Pt admits to difficulty hearing. Discussion on seeing audiologist and he is ok with referral.   Pt bp is mild elevated initially but better on recheck. Pt states his wrist bp cuff 130/80. On amlodipine and chlorthalidone.  Pt has mild high cholesterol. He is on atorvastatin.   Pt has some ED in past.    Pt needs a wellness exam in December. So will get labs with wellness.   Review of Systems  Constitutional: Negative for chills, diaphoresis, fatigue and unexpected weight change.  HENT: Positive for hearing loss. Negative for congestion, ear discharge, ear pain and sore throat.   Cardiovascular: Negative for chest pain and palpitations.  Gastrointestinal: Negative for abdominal pain.  Musculoskeletal: Negative for back pain.  Skin: Negative for rash.  Neurological: Negative for dizziness and headaches.  Hematological: Negative for adenopathy. Does not bruise/bleed easily.  Psychiatric/Behavioral: Negative for behavioral problems and confusion.    Past Medical History:  Diagnosis Date   Chronic renal insufficiency, stage II (mild)    CrCl 60s   Erectile dysfunction    Hearing impaired    Herpes zoster    R side of face   Hyperlipidemia    Hypertension    Impaired fasting glucose 2013-2015   Personal history of kidney stones    Recurrent herpes labialis      Social History   Socioeconomic History   Marital status: Married    Spouse name: Not on file   Number of children: Not on file   Years of education: Not on file   Highest  education level: Not on file  Occupational History   Not on file  Tobacco Use   Smoking status: Former Smoker   Smokeless tobacco: Never Used  Vaping Use   Vaping Use: Never used  Substance and Sexual Activity   Alcohol use: Yes    Alcohol/week: 2.0 standard drinks    Types: 1 Glasses of wine, 1 Cans of beer per week   Drug use: No   Sexual activity: Not on file  Other Topics Concern   Not on file  Social History Narrative   Married, no children.   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   No T/A/Ds.   Exercise: stationary bike/nautilus 5 days a week.         Social Determinants of Health   Financial Resource Strain:    Difficulty of Paying Living Expenses:   Food Insecurity:    Worried About Programme researcher, broadcasting/film/video in the Last Year:    Barista in the Last Year:   Transportation Needs:    Freight forwarder (Medical):    Lack of Transportation (Non-Medical):   Physical Activity:    Days of Exercise per Week:    Minutes of Exercise per Session:   Stress:    Feeling of Stress :   Social Connections:    Frequency of Communication with Friends and Family:    Frequency of Social Gatherings  with Friends and Family:    Attends Religious Services:    Active Member of Clubs or Organizations:    Attends Banker Meetings:    Marital Status:   Intimate Partner Violence:    Fear of Current or Ex-Partner:    Emotionally Abused:    Physically Abused:    Sexually Abused:     Past Surgical History:  Procedure Laterality Date   COLONOSCOPY  2002; 2014   Repeat 2024   LITHOTRIPSY      Family History  Problem Relation Age of Onset   Colon cancer Neg Hx     Allergies  Allergen Reactions   Atenolol     bradycarda- heart rate down to 38 bpm   Demerol Nausea And Vomiting    vomiting    Current Outpatient Medications on File Prior to Visit  Medication Sig Dispense Refill   amLODipine (NORVASC) 10 MG tablet TAKE  ONE TABLET BY MOUTH DAILY 90 tablet 0   aspirin 81 MG tablet Take 81 mg by mouth daily.     atorvastatin (LIPITOR) 20 MG tablet Take 1 tablet (20 mg total) by mouth daily. 30 tablet 0   chlorthalidone (HYGROTON) 25 MG tablet Take 1 tablet (25 mg total) by mouth daily. 90 tablet 0   meloxicam (MOBIC) 15 MG tablet      sildenafil (VIAGRA) 100 MG tablet TAKE 1/2-1 TABLET BY MOUTH DAILY 30 MINUTES PRIOR TO INTERCOURSE. 6 tablet 5   No current facility-administered medications on file prior to visit.    BP 137/75    Pulse 64    Resp 18    Ht 5\' 8"  (1.727 m)    Wt 188 lb 6.4 oz (85.5 kg)    SpO2 94%    BMI 28.65 kg/m       Objective:   Physical Exam  General Mental Status- Alert. General Appearance- Not in acute distress.   Skin General: Color- Normal Color. Moisture- Normal Moisture.  Neck Carotid Arteries- Normal color. Moisture- Normal Moisture. No carotid bruits. No JVD.  Chest and Lung Exam Auscultation: Breath Sounds:-Normal.  Cardiovascular Auscultation:Rythm- Regular. Murmurs & Other Heart Sounds:Auscultation of the heart reveals- No Murmurs.  Abdomen Inspection:-Inspeection Normal. Palpation/Percussion:Note:No mass. Palpation and Percussion of the abdomen reveal- Non Tender, Non Distended + BS, no rebound or guarding.    Neurologic Cranial Nerve exam:- CN III-XII intact(No nystagmus), symmetric smile. Strength:- 5/5 equal and symmetric strength both upper and lower extremities.  Heent- no sinus pressure. Left canal clear and tm normal. Rt canal clear but appears to have think film of dry wax covering tm.        Assessment & Plan:  For htn recommend continue amlodipine and chlorthalidone. Keep checking bp maybe 3 times a week.  For high cholesterol continue low cholesterol diet and exercise. Continue lipid medication atorvastatin.  For decreased hearing with apparent small amount deep dry wax on rt side will refer you to audiologist and investigate if ENT  in same office as they might want to clear the small amount of wax.    Can get flu vaccine in October. Can do nurse vaccine visit.  Schedule wellness exam early December and will do standard labs including a1c.  Esperanza Richters, PA-C   Time spent with new patient today was 35  minutes which consisted of chart review, discussing diagnosis, reviewing history, treatment, referral and documentation.

## 2020-07-21 NOTE — Patient Instructions (Addendum)
For htn recommend continue amlodipine and chlorthalidone. Keep checking bp maybe 3 times a week.  For high cholesterol continue low cholesterol diet and exercise. Continue lipid medication atorvastatin.  For decreased hearing with apparent small amount deep dry wax on rt side will refer you to audiologist and investigate if ENT in same office as they might want to clear the small amount of wax.    Can get flu vaccine in October. Can do nurse vaccine visit.  Schedule wellness exam early December and will do standard labs including a1c.

## 2020-08-14 ENCOUNTER — Telehealth: Payer: Self-pay | Admitting: Medical

## 2020-08-14 MED ORDER — AMLODIPINE BESYLATE 10 MG PO TABS: 10.0000 mg | ORAL_TABLET | Freq: Every day | ORAL | 0 refills | Status: DC

## 2020-08-14 NOTE — Telephone Encounter (Signed)
Rx sent 

## 2020-08-14 NOTE — Telephone Encounter (Signed)
Medication: amLODipine (NORVASC) 10 MG tablet [[id number]]   atorvastatin (LIPITOR) 20 MG tablet [[id number]]      Has the patient contacted their pharmacy?  (If no, request that the patient contact the pharmacy for the refill.) (If yes, when and what did the pharmacy advise?)     Preferred Pharmacy (with phone number or street name): [facility] [place], [place] - [address]  [address], [place] [address]  Phone:  [phone] Fax:  [phone]    Agent: Please be advised that RX refills may take up to 3 business days. We ask that you follow-up with your pharmacy.

## 2020-08-18 ENCOUNTER — Telehealth: Payer: Self-pay | Admitting: Medical

## 2020-08-18 MED ORDER — ATORVASTATIN CALCIUM 20 MG PO TABS: 20.0000 mg | ORAL_TABLET | Freq: Every day | ORAL | 0 refills | Status: DC

## 2020-08-18 NOTE — Telephone Encounter (Signed)
Rx sent 

## 2020-08-18 NOTE — Telephone Encounter (Signed)
Medication: atorvastatin (LIPITOR) 20 MG tablet [[id number]]   Has the patient contacted their pharmacy? No. (If no, request that the patient contact the pharmacy for the refill.) (If yes, when and what did the pharmacy advise?)  Preferred Pharmacy (with phone number or street name):  [facility] [place], [place] - [address]  [address], [place] [address]  Phone:  [phone] Fax:  [phone]  DEA #:  -- Agent: Please be advised that RX refills may take up to 3 business days. We ask that you follow-up with your pharmacy.

## 2020-09-13 ENCOUNTER — Other Ambulatory Visit: Payer: Self-pay | Admitting: Medical

## 2020-09-14 ENCOUNTER — Telehealth: Payer: Self-pay | Admitting: Medical

## 2020-09-14 DIAGNOSIS — I10 Essential (primary) hypertension: Secondary | ICD-10-CM

## 2020-09-14 MED ORDER — AMLODIPINE BESYLATE 10 MG PO TABS: 10.0000 mg | ORAL_TABLET | Freq: Every day | ORAL | 0 refills | Status: DC

## 2020-09-14 MED ORDER — MELOXICAM 15 MG PO TABS: 15.0000 mg | ORAL_TABLET | Freq: Every day | ORAL | 0 refills | Status: AC

## 2020-09-14 MED ORDER — CHLORTHALIDONE 25 MG PO TABS: 25.0000 mg | ORAL_TABLET | Freq: Every day | ORAL | 0 refills | Status: DC

## 2020-09-14 NOTE — Telephone Encounter (Signed)
Rx sent 

## 2020-09-14 NOTE — Telephone Encounter (Signed)
Medication: sildenafil (VIAGRA) 100 MG tablet [[id number]]   chlorthalidone (HYGROTON) 25 MG tablet [[id number]]  amLODipine (NORVASC) 10 MG tablet [[id number]]     meloxicam (MOBIC) 15 MG tablet [[id number]]   Has the patient contacted their pharmacy? No. (If no, request that the patient contact the pharmacy for the refill.) (If yes, when and what did the pharmacy advise?)  Preferred Pharmacy (with phone number or street name): [facility] [place], [place] - [address]  [address] [place] [address]  Phone:  [phone] Fax:  [phone]  DEA #:  --  Agent: Please be advised that RX refills may take up to 3 business days. We ask that you follow-up with your pharmacy.

## 2020-10-10 ENCOUNTER — Other Ambulatory Visit: Payer: Self-pay | Admitting: Medical

## 2020-11-17 ENCOUNTER — Encounter: Payer: Self-pay | Admitting: Medical

## 2020-11-17 ENCOUNTER — Ambulatory Visit (INDEPENDENT_AMBULATORY_CARE_PROVIDER_SITE_OTHER): Payer: PRIVATE HEALTH INSURANCE | Admitting: Medical

## 2020-11-17 ENCOUNTER — Other Ambulatory Visit: Payer: Self-pay

## 2020-11-17 ENCOUNTER — Ambulatory Visit (HOSPITAL_BASED_OUTPATIENT_CLINIC_OR_DEPARTMENT_OTHER)
Admission: RE | Admit: 2020-11-17 | Discharge: 2020-11-17 | Disposition: A | Discharge status: Home / Self Care | Payer: PRIVATE HEALTH INSURANCE | Source: Ambulatory Visit | Attending: Medical | Admitting: Medical

## 2020-11-17 VITALS — BP 148/90 | HR 60 | Temp 98.2°F | Ht 68.0 in | Wt 190.6 lb

## 2020-11-17 DIAGNOSIS — R350 Frequency of micturition: Secondary | ICD-10-CM

## 2020-11-17 DIAGNOSIS — M2550 Pain in unspecified joint: Secondary | ICD-10-CM

## 2020-11-17 DIAGNOSIS — R739 Hyperglycemia, unspecified: Secondary | ICD-10-CM

## 2020-11-17 DIAGNOSIS — M79644 Pain in right finger(s): Secondary | ICD-10-CM

## 2020-11-17 DIAGNOSIS — I10 Essential (primary) hypertension: Secondary | ICD-10-CM | POA: Diagnosis not present

## 2020-11-17 DIAGNOSIS — Z23 Encounter for immunization: Secondary | ICD-10-CM

## 2020-11-17 DIAGNOSIS — E7800 Pure hypercholesterolemia, unspecified: Secondary | ICD-10-CM | POA: Diagnosis not present

## 2020-11-17 DIAGNOSIS — M79641 Pain in right hand: Secondary | ICD-10-CM

## 2020-11-17 LAB — CBC WITH DIFFERENTIAL/PLATELET
Basophils Absolute: 0.1 10*3/uL (ref 0.0–0.1)
Basophils Relative: 1.3 % (ref 0.0–3.0)
Eosinophils Absolute: 0.2 10*3/uL (ref 0.0–0.7)
Eosinophils Relative: 4.8 % (ref 0.0–5.0)
HCT: 45.8 % (ref 39.0–52.0)
Hemoglobin: 15.4 g/dL (ref 13.0–17.0)
Lymphocytes Relative: 19.8 % (ref 12.0–46.0)
Lymphs Abs: 1 10*3/uL (ref 0.7–4.0)
MCHC: 33.6 g/dL (ref 30.0–36.0)
MCV: 87.5 fl (ref 78.0–100.0)
Monocytes Absolute: 0.5 10*3/uL (ref 0.1–1.0)
Monocytes Relative: 10.8 % (ref 3.0–12.0)
Neutro Abs: 3.1 10*3/uL (ref 1.4–7.7)
Neutrophils Relative %: 63.3 % (ref 43.0–77.0)
Platelets: 242 10*3/uL (ref 150.0–400.0)
RBC: 5.24 Mil/uL (ref 4.22–5.81)
RDW: 13.5 % (ref 11.5–15.5)
WBC: 4.9 10*3/uL (ref 4.0–10.5)

## 2020-11-17 LAB — C-REACTIVE PROTEIN: CRP: <1 mg/dL (ref 0.5–20.0)

## 2020-11-17 LAB — COMPREHENSIVE METABOLIC PANEL
ALT: 22 U/L (ref 0–53)
AST: 21 U/L (ref 0–37)
Albumin: 4.4 g/dL (ref 3.5–5.2)
Alkaline Phosphatase: 55 U/L (ref 39–117)
BUN: 19 mg/dL (ref 6–23)
CO2: 28 mEq/L (ref 19–32)
Calcium: 9.3 mg/dL (ref 8.4–10.5)
Chloride: 100 mEq/L (ref 96–112)
Creatinine, Ser: 1.12 mg/dL (ref 0.40–1.50)
GFR: 67.01 mL/min (ref >60.00)
Glucose, Bld: 100 mg/dL — ABNORMAL HIGH (ref 70–99)
Potassium: 4.2 mEq/L (ref 3.5–5.1)
Sodium: 137 mEq/L (ref 135–145)
Total Bilirubin: 1.1 mg/dL (ref 0.2–1.2)
Total Protein: 7.5 g/dL (ref 6.0–8.3)

## 2020-11-17 LAB — LIPID PANEL
Cholesterol: 190 mg/dL (ref 0–200)
HDL: 62.3 mg/dL (ref >39.00)
LDL Cholesterol: 115 mg/dL — ABNORMAL HIGH (ref 0–99)
NonHDL: 127.31
Total CHOL/HDL Ratio: 3
Triglycerides: 63 mg/dL (ref 0.0–149.0)
VLDL: 12.6 mg/dL (ref 0.0–40.0)

## 2020-11-17 LAB — SEDIMENTATION RATE: Sed Rate: 17 mm/hr (ref 0–20)

## 2020-11-17 LAB — PSA: PSA: 0.26 ng/mL (ref 0.10–4.00)

## 2020-11-17 LAB — URIC ACID: Uric Acid, Serum: 6.8 mg/dL (ref 4.0–7.8)

## 2020-11-17 LAB — HEMOGLOBIN A1C: Hgb A1c MFr Bld: 5.8 % (ref 4.6–6.5)

## 2020-11-17 NOTE — Patient Instructions (Addendum)
Your bp is high today. Better a year ago. Check bp later today and call back give reading. Check bp 3 times a week. Want bp lower than 140/90. Actually closer to 130/80. If bp not better then consider adding losartan.  For high cholesterol continue atrovastatin and get lab today cmp with lipid panel.  For elevated sugar check a1c.   For some frequent urination will get psa.   For thumb pains/arthralgias get rt hand/thumb xray and inflammatory lab studies.  Follow up date to be determined after lab review. Please give me update by one week on your bp readings.

## 2020-11-17 NOTE — Addendum Note (Signed)
Addended by: Jeronimo Greaves on: 11/17/2020 09:07 AM   Modules accepted: Orders

## 2020-11-17 NOTE — Progress Notes (Signed)
Subjective:    Patient ID: Leroy Proctor, male    DOB: Jul 08, 1951, 69 y.o.   MRN: [id number]  HPI   Pt in today for check up. He has not eaten breakfast.  Pt bp is elevated today. No gross motor or sensory function deficits. Better bp on recheck.   Has high cholesterol and is on atorvastatin.  Pt is working out 5 days a week.   Pt will get flu vaccine.  Pt has had covid vaccine. Had initial 2 shot series and then got booster.   Pt states he has some mild weak grip strength. Weakness to thumbs. Some pain at base of thumbs. Pt is rt handed.   Review of Systems  Constitutional: Negative for chills, fatigue and fever.  HENT: Negative for congestion, ear pain, hearing loss, postnasal drip and sinus pain.   Respiratory: Negative for chest tightness, shortness of breath and wheezing.   Cardiovascular: Negative for chest pain and palpitations.  Gastrointestinal: Negative for abdominal pain.  Genitourinary: Negative for dysuria.  Musculoskeletal: Negative for back pain and myalgias.  Skin: Negative for rash.  Neurological: Negative for dizziness, seizures, syncope, speech difficulty, weakness, numbness and headaches.  Psychiatric/Behavioral: Negative for behavioral problems, confusion and sleep disturbance.    Past Medical History:  Diagnosis Date  . Chronic renal insufficiency, stage II (mild)    CrCl 60s  . Erectile dysfunction   . Hearing impaired   . Herpes zoster    R side of face  . Hyperlipidemia   . Hypertension   . Impaired fasting glucose 2013-2015  . Personal history of kidney stones   . Recurrent herpes labialis      Social History   Socioeconomic History  . Marital status: Married    Spouse name: Not on file  . Number of children: Not on file  . Years of education: Not on file  . Highest education level: Not on file  Occupational History  . Not on file  Tobacco Use  . Smoking status: Former Games developer  . Smokeless tobacco: Never Used  Vaping Use  .  Vaping Use: Never used  Substance and Sexual Activity  . Alcohol use: Yes    Alcohol/week: 2.0 standard drinks    Types: 1 Glasses of wine, 1 Cans of beer per week  . Drug use: No  . Sexual activity: Not on file  Other Topics Concern  . Not on file  Social History Narrative   Married, no children.   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   No T/A/Ds.   Exercise: stationary bike/nautilus 5 days a week.         Social Determinants of Health   Financial Resource Strain: Not on file  Food Insecurity: Not on file  Transportation Needs: Not on file  Physical Activity: Not on file  Stress: Not on file  Social Connections: Not on file  Intimate Partner Violence: Not on file    Past Surgical History:  Procedure Laterality Date  . COLONOSCOPY  2002; 2014   Repeat 2024  . LITHOTRIPSY      Family History  Problem Relation Age of Onset  . Colon cancer Neg Hx     Allergies  Allergen Reactions  . Atenolol     bradycarda- heart rate down to 38 bpm  . Demerol Nausea And Vomiting    vomiting    Current Outpatient Medications on File Prior to Visit  Medication Sig Dispense Refill  . amLODipine (NORVASC) 10 MG  tablet Take 1 tablet (10 mg total) by mouth daily. 90 tablet 0  . aspirin 81 MG tablet Take 81 mg by mouth daily.    Marland Kitchen atorvastatin (LIPITOR) 20 MG tablet TAKE ONE TABLET BY MOUTH DAILY 30 tablet 1  . chlorthalidone (HYGROTON) 25 MG tablet Take 1 tablet (25 mg total) by mouth daily. 90 tablet 0  . sildenafil (VIAGRA) 100 MG tablet TAKE 1/2-1 TABLET BY MOUTH DAILY 30 MINUTES PRIOR TO INTERCOURSE. 6 tablet 5   No current facility-administered medications on file prior to visit.    BP (!) 154/89   Pulse 60   Temp 98.2 F (36.8 C) (Oral)   Ht 5\' 8"  (1.727 m)   Wt 190 lb 9.6 oz (86.5 kg)   SpO2 94%   BMI 28.98 kg/m       Objective:   Physical Exam  General Mental Status- Alert. General Appearance- Not in acute distress.   Skin General: Color- Normal  Color. Moisture- Normal Moisture.  Neck Carotid Arteries- Normal color. Moisture- Normal Moisture. No carotid bruits. No JVD.  Chest and Lung Exam Auscultation: Breath Sounds:-Normal.  Cardiovascular Auscultation:Rythm- Regular. Murmurs & Other Heart Sounds:Auscultation of the heart reveals- No Murmurs.  Abdomen Inspection:-Inspeection Normal. Palpation/Percussion:Note:No mass. Palpation and Percussion of the abdomen reveal- Non Tender, Non Distended + BS, no rebound or guarding.    Neurologic Cranial Nerve exam:- CN III-XII intact(No nystagmus), symmetric smile. Strength:- 5/5 equal and symmetric strength both upper and lower extremities.      Assessment & Plan:  Your bp is high today. Better a year ago. Check bp later today and call back give reading. Check bp 3 times a week. Want bp lower than 140/90. Actually closer to 130/80. If bp not better then consider adding losartan.  For high cholesterol continue atrovastatin and get lab today cmp with lipid panel.  For elevated sugar check a1c.   For some frequent urination will get psa.   For thumb pains/arthralgias get rt hand/thumb xray and inflammatory lab studies.  Follow up date to be determined after lab review. Please give me update by one week on your bp readings.  Esperanza Richters, PA-C

## 2020-11-18 LAB — ANA: Anti Nuclear Antibody (ANA): NEGATIVE

## 2020-11-18 LAB — RHEUMATOID FACTOR: Rheumatoid fact SerPl-aCnc: <14 IU/mL (ref <14)

## 2020-12-09 ENCOUNTER — Other Ambulatory Visit: Payer: Self-pay | Admitting: Medical

## 2020-12-09 DIAGNOSIS — I10 Essential (primary) hypertension: Secondary | ICD-10-CM

## 2020-12-11 ENCOUNTER — Other Ambulatory Visit: Payer: Self-pay | Admitting: Medical

## 2021-01-19 ENCOUNTER — Ambulatory Visit: Payer: PRIVATE HEALTH INSURANCE | Admitting: Medical

## 2021-01-20 ENCOUNTER — Other Ambulatory Visit: Payer: Self-pay

## 2021-01-21 ENCOUNTER — Other Ambulatory Visit: Payer: Self-pay

## 2021-01-21 ENCOUNTER — Ambulatory Visit: Payer: PRIVATE HEALTH INSURANCE | Admitting: Medical

## 2021-01-21 VITALS — BP 125/63 | HR 63 | Temp 98.6°F | Resp 18 | Ht 68.0 in | Wt 195.0 lb

## 2021-01-21 DIAGNOSIS — K635 Polyp of colon: Secondary | ICD-10-CM | POA: Diagnosis not present

## 2021-01-21 DIAGNOSIS — I10 Essential (primary) hypertension: Secondary | ICD-10-CM

## 2021-01-21 DIAGNOSIS — H6121 Impacted cerumen, right ear: Secondary | ICD-10-CM | POA: Diagnosis not present

## 2021-01-21 DIAGNOSIS — E785 Hyperlipidemia, unspecified: Secondary | ICD-10-CM | POA: Diagnosis not present

## 2021-01-21 NOTE — Patient Instructions (Addendum)
You cleared the rt side of canal completely. You have very scant minimal dry appearing wax on left side. Tm seen on left side. I don't think trying to remove left side scant wax necessary. You could try to irrigate at home if you want.  Your blood pressure is well controlled. Continue norvasc 10 mg daily and chlorthalidone 25 mg daily.  For high cholesterol continue atorvastatin 20 mg daily. Lipids controlled in December.  For hx of colon polyp placed referral to Dr. Carlean Purl office.  Elevated sugar on last lab. Recommend low sugar diet. A1c not in diabetic range.   Follow up 3 month or as needed

## 2021-01-21 NOTE — Progress Notes (Signed)
Subjective:    Patient ID: Leroy Proctor, male    DOB: [birth year], 70 y.o.   MRN: [id number]  HPI Pt in process of getting hearing aids. He states went twice to try to get hearing test but rt canal has too much wax. Pt states he has tried to clear wax out himself and got some. He is scheduled to get retested at audiologist tomorrow.  He described he got out large amount of wax out of his rt ear.   Pt has htn and high cholesterol.   Bp is well controlled today. He is on amlodipine 10 mg q day. Aso on chlorthaldione 25 mg daily.    Pt has hx of ED. He states viagra gives him a headache.  Pt is going to get medicare. He stats he used cialis and it did help.     Review of Systems  Constitutional: Negative for chills, fatigue and fever.  HENT: Negative for congestion and drooling.        See hpi.  Respiratory: Negative for cough, chest tightness, shortness of breath and wheezing.   Cardiovascular: Negative for chest pain and palpitations.  Gastrointestinal: Negative for abdominal pain, anal bleeding, constipation, diarrhea and vomiting.  Genitourinary: Negative for enuresis.  Musculoskeletal: Negative for back pain.  Skin: Negative for rash.  Neurological: Negative for dizziness, numbness and headaches.  Hematological: Negative for adenopathy. Does not bruise/bleed easily.  Psychiatric/Behavioral: Negative for behavioral problems, decreased concentration, dysphoric mood and sleep disturbance. The patient is not nervous/anxious.     Past Medical History:  Diagnosis Date  . Chronic renal insufficiency, stage II (mild)    CrCl 60s  . Erectile dysfunction   . Hearing impaired   . Herpes zoster    R side of face  . Hyperlipidemia   . Hypertension   . Impaired fasting glucose 2013-2015  . Personal history of kidney stones   . Recurrent herpes labialis      Social History   Socioeconomic History  . Marital status: Married    Spouse name: Not on file  . Number of  children: Not on file  . Years of education: Not on file  . Highest education level: Not on file  Occupational History  . Not on file  Tobacco Use  . Smoking status: Former Games developer  . Smokeless tobacco: Never Used  Vaping Use  . Vaping Use: Never used  Substance and Sexual Activity  . Alcohol use: Yes    Alcohol/week: 2.0 standard drinks    Types: 1 Glasses of wine, 1 Cans of beer per week  . Drug use: No  . Sexual activity: Not on file  Other Topics Concern  . Not on file  Social History Narrative   Married, no children.   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   No T/A/Ds.   Exercise: stationary bike/nautilus 5 days a week.         Social Determinants of Health   Financial Resource Strain: Not on file  Food Insecurity: Not on file  Transportation Needs: Not on file  Physical Activity: Not on file  Stress: Not on file  Social Connections: Not on file  Intimate Partner Violence: Not on file    Past Surgical History:  Procedure Laterality Date  . COLONOSCOPY  2002; 2014   Repeat 2024  . LITHOTRIPSY      Family History  Problem Relation Age of Onset  . Colon cancer Neg Hx     Allergies  Allergen Reactions  . Atenolol     bradycarda- heart rate down to 38 bpm  . Demerol Nausea And Vomiting    vomiting    Current Outpatient Medications on File Prior to Visit  Medication Sig Dispense Refill  . amLODipine (NORVASC) 10 MG tablet Take 1 tablet (10 mg total) by mouth daily. 90 tablet 0  . aspirin 81 MG tablet Take 81 mg by mouth daily.    Marland Kitchen atorvastatin (LIPITOR) 20 MG tablet Take 1 tablet (20 mg total) by mouth daily. 90 tablet 1  . chlorthalidone (HYGROTON) 25 MG tablet Take 1 tablet (25 mg total) by mouth daily. 90 tablet 1  . sildenafil (VIAGRA) 100 MG tablet TAKE 1/2-1 TABLET BY MOUTH DAILY 30 MINUTES PRIOR TO INTERCOURSE. 6 tablet 5   No current facility-administered medications on file prior to visit.    BP 125/63   Pulse 63   Temp 98.6 F (37  C) (Oral)   Resp 18   Ht 5\' 8"  (1.727 m)   Wt 195 lb (88.5 kg)   SpO2 97%   BMI 29.65 kg/m       Objective:   Physical Exam  General  Mental Status - Alert. General Appearance - Well groomed. Not in acute distress.  Skin Rashes- No Rashes.  HEENT Head- Normal. Ear Auditory Canal - Left- scant minimal wax at top of canal.  Right - Normal.Tympanic Membrane- Left- Normal. Right- Normal.   Neck Neck- Supple. No Masses.   Chest and Lung Exam Auscultation: Breath Sounds:-Clear even and unlabored.  Cardiovascular Auscultation:Rythm- Regular, rate and rhythm. Murmurs & Other Heart Sounds:Ausculatation of the heart reveal- No Murmurs.  Lymphatic Head & Neck General Head & Neck Lymphatics: Bilateral: Description- No Localized lymphadenopathy.       Assessment & Plan:  You cleared the rt side of canal completely. You have very scant minimal dry appearing wax on left side. Tm seen on left side. I don't think trying to remove left side scant wax necessary. You could try to irrigate at home if you want.  Your blood pressure is well controlled. Continue norvasc 10 mg daily and chlorthalidone 25 mg daily.  For high cholesterol continue atorvastatin 20 mg daily. Lipids controlled in December.  For hx of colon polyp placed referral to Dr. Leone Payor office.  Elevated sugar on last lab. Recommend low sugar diet. A1c not in diabetic range.   Follow up 3 month or as needed  [employer], PA-C

## 2021-03-09 ENCOUNTER — Other Ambulatory Visit: Payer: Self-pay | Admitting: Medical

## 2021-03-16 ENCOUNTER — Ambulatory Visit: Payer: PRIVATE HEALTH INSURANCE | Attending: Internal Medicine

## 2021-03-16 DIAGNOSIS — Z23 Encounter for immunization: Secondary | ICD-10-CM

## 2021-03-16 NOTE — Progress Notes (Signed)
Covid-19 Vaccination Clinic  Name:  Leroy Proctor    MRN: [id number] DOB: 25-Nov-1951  03/16/2021  Mr. [name] was observed post Covid-19 immunization for 15 minutes without incident. He was provided with Vaccine Information Sheet and instruction to access the V-Safe system.   Mr. [name] was instructed to call 911 with any severe reactions post vaccine: Marland Kitchen Difficulty breathing  . Swelling of face and throat  . A fast heartbeat  . A bad rash all over body  . Dizziness and weakness   Immunizations Administered    Name Date Dose VIS Date Route   PFIZER Comrnaty(Gray TOP) Covid-19 Vaccine 03/16/2021 11:40 AM 0.3 mL 11/12/2020 Intramuscular   Manufacturer: [employer], [employer]   Lot: [id number]   NDC: [phone]

## 2021-03-22 ENCOUNTER — Other Ambulatory Visit (HOSPITAL_BASED_OUTPATIENT_CLINIC_OR_DEPARTMENT_OTHER): Payer: Self-pay

## 2021-03-22 MED ORDER — PFIZER-BIONT COVID-19 VAC-TRIS 30 MCG/0.3ML IM SUSP
INTRAMUSCULAR | 0 refills | Status: DC
  Filled 2021-03-22: qty 0.3, 1d supply, fill #0

## 2021-06-08 ENCOUNTER — Other Ambulatory Visit: Payer: Self-pay | Admitting: Medical

## 2021-06-08 DIAGNOSIS — I10 Essential (primary) hypertension: Secondary | ICD-10-CM

## 2021-07-07 ENCOUNTER — Encounter: Payer: Self-pay | Admitting: Medical

## 2021-07-08 ENCOUNTER — Telehealth: Payer: Self-pay | Admitting: Medical

## 2021-07-08 MED ORDER — TADALAFIL 20 MG PO TABS: 10.0000 mg | ORAL_TABLET | ORAL | 1 refills | Status: DC | PRN

## 2021-07-08 NOTE — Telephone Encounter (Signed)
Rx cialis sent to pharmacy.

## 2021-07-08 NOTE — Addendum Note (Signed)
Addended by: Anabel Halon on: 07/08/2021 04:07 PM   Modules accepted: Orders

## 2021-09-02 ENCOUNTER — Other Ambulatory Visit: Payer: Self-pay | Admitting: Medical

## 2021-09-10 ENCOUNTER — Other Ambulatory Visit (HOSPITAL_BASED_OUTPATIENT_CLINIC_OR_DEPARTMENT_OTHER): Payer: Self-pay

## 2021-09-10 MED ORDER — INFLUENZA VAC A&B SA ADJ QUAD 0.5 ML IM PRSY
PREFILLED_SYRINGE | INTRAMUSCULAR | 0 refills | Status: DC
  Filled 2021-09-10: qty 0.5, 1d supply, fill #0

## 2021-09-13 ENCOUNTER — Other Ambulatory Visit: Payer: Self-pay | Admitting: Medical

## 2021-10-05 ENCOUNTER — Other Ambulatory Visit: Payer: Self-pay

## 2021-10-05 ENCOUNTER — Ambulatory Visit (INDEPENDENT_AMBULATORY_CARE_PROVIDER_SITE_OTHER): Payer: Medicare Other | Admitting: Medical

## 2021-10-05 ENCOUNTER — Encounter: Payer: Self-pay | Admitting: Medical

## 2021-10-05 VITALS — BP 139/70 | HR 55 | Temp 98.1°F | Resp 18 | Ht 68.0 in | Wt 198.0 lb

## 2021-10-05 DIAGNOSIS — R350 Frequency of micturition: Secondary | ICD-10-CM

## 2021-10-05 DIAGNOSIS — E785 Hyperlipidemia, unspecified: Secondary | ICD-10-CM

## 2021-10-05 DIAGNOSIS — K635 Polyp of colon: Secondary | ICD-10-CM

## 2021-10-05 DIAGNOSIS — I10 Essential (primary) hypertension: Secondary | ICD-10-CM

## 2021-10-05 DIAGNOSIS — R739 Hyperglycemia, unspecified: Secondary | ICD-10-CM

## 2021-10-05 NOTE — Patient Instructions (Signed)
Hypertension.  Well-controlled today on recheck.  Also well controlled we will recheck at the 1 cm at your home.  Continue amlodipine 10 mg daily and chlorthalidone 25 mg daily.  Elevated blood sugar.  Continue regular exercise and low sugar diet.  Placing future metabolic panel and Y3K.  Hyperlipidemia.  Well-controlled on last check.  Placing future lipid panel.  Continue atorvastatin.  History of colon polyps on last colonoscopy in 2014.  I do not see when is the recommended repeat date.  I placed a referral to gastroenterologist office today to get you scheduled for the recommended repeat date.  Some frequent urination at night.  Could be BPH.  Decided to go ahead and get PSA to see if any abnormal PSA elevation.  Counseled patient on study.  This elevated PSA with refer to urologist.  Please schedule fasting labs on the way out.  Can get Shingrix vaccine through the pharmacy.  Follow-up date to be determined after lab review.

## 2021-10-05 NOTE — Progress Notes (Signed)
Subjective:    Patient ID: Leroy Proctor, male    DOB: 1951/09/02, 70 y.o.   MRN: [id number]  HPI Pt in for follow up.  Htn- pt on amlodipine 10 mg daily and chlorthalidone 25 mg. He did take medication.  Pt at [facility] will check bp every 2 weeks 130/70 usually. At home monitor will get around 130-120 systolic.  Bp was high initially. He states he forgot his medical insurance card so he states that stress.  High cholesterol- he is atorvastatin. 20 mg daily.  Pt had flu vaccine 09-10-2021.  States got covid booster in September.      Review of Systems  Constitutional:  Negative for chills, fatigue and fever.  Respiratory:  Negative for cough, chest tightness, wheezing and stridor.   Cardiovascular:  Negative for chest pain and palpitations.  Gastrointestinal:  Negative for abdominal pain, blood in stool, diarrhea and vomiting.  Genitourinary:  Positive for frequency. Negative for dysuria, flank pain, penile pain and testicular pain.       Frequent urination at night.  Musculoskeletal:  Negative for back pain, myalgias and neck pain.  Skin:  Negative for rash.    Past Medical History:  Diagnosis Date   Chronic renal insufficiency, stage II (mild)    CrCl 60s   Erectile dysfunction    Hearing impaired    Herpes zoster    R side of face   Hyperlipidemia    Hypertension    Impaired fasting glucose 2013-2015   Personal history of kidney stones    Recurrent herpes labialis      Social History   Socioeconomic History   Marital status: Married    Spouse name: Not on file   Number of children: Not on file   Years of education: Not on file   Highest education level: Not on file  Occupational History   Not on file  Tobacco Use   Smoking status: Former   Smokeless tobacco: Never  Vaping Use   Vaping Use: Never used  Substance and Sexual Activity   Alcohol use: Yes    Alcohol/week: 2.0 standard drinks    Types: 1 Glasses of wine, 1 Cans of beer per week   Drug use:  No   Sexual activity: Not on file  Other Topics Concern   Not on file  Social History Narrative   Married, no children.   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   No T/A/Ds.   Exercise: stationary bike/nautilus 5 days a week.         Social Determinants of Health   Financial Resource Strain: Not on file  Food Insecurity: Not on file  Transportation Needs: Not on file  Physical Activity: Not on file  Stress: Not on file  Social Connections: Not on file  Intimate Partner Violence: Not on file    Past Surgical History:  Procedure Laterality Date   COLONOSCOPY  2002; 2014   Repeat 2024   LITHOTRIPSY      Family History  Problem Relation Age of Onset   Colon cancer Neg Hx     Allergies  Allergen Reactions   Atenolol     bradycarda- heart rate down to 38 bpm   Demerol Nausea And Vomiting    vomiting    Current Outpatient Medications on File Prior to Visit  Medication Sig Dispense Refill   amLODipine (NORVASC) 10 MG tablet TAKE 1 TABLET BY MOUTH DAILY 30 tablet 0   aspirin 81 MG tablet  Take 81 mg by mouth daily.     atorvastatin (LIPITOR) 20 MG tablet TAKE 1 TABLET BY MOUTH DAILY 90 tablet 1   chlorthalidone (HYGROTON) 25 MG tablet TAKE 1 TABLET BY MOUTH DAILY 90 tablet 1   COVID-19 mRNA Vac-TriS, Pfizer, (PFIZER-BIONT COVID-19 VAC-TRIS) SUSP injection Inject into the muscle. 0.3 mL 0   influenza vaccine adjuvanted (FLUAD) 0.5 ML injection Inject into the muscle. 0.5 mL 0   tadalafil (CIALIS) 20 MG tablet Take 0.5-1 tablets (10-20 mg total) by mouth every other day as needed for erectile dysfunction. 10 tablet 1   No current facility-administered medications on file prior to visit.    BP 139/70   Pulse (!) 55   Temp 98.1 F (36.7 C)   Resp 18   Ht 5\' 8"  (1.727 m)   Wt 198 lb (89.8 kg)   SpO2 95%   BMI 30.11 kg/m       Objective:   Physical Exam  General Mental Status- Alert. General Appearance- Not in acute distress.   Skin General: Color-  Normal Color. Moisture- Normal Moisture.  Neck Carotid Arteries- Normal color. Moisture- Normal Moisture. No carotid bruits. No JVD.  Chest and Lung Exam Auscultation: Breath Sounds:-Normal.  Cardiovascular Auscultation:Rythm- Regular. Murmurs & Other Heart Sounds:Auscultation of the heart reveals- No Murmurs.  Abdomen Inspection:-Inspeection Normal. Palpation/Percussion:Note:No mass. Palpation and Percussion of the abdomen reveal- Non Tender, Non Distended + BS, no rebound or guarding.    Neurologic Cranial Nerve exam:- CN III-XII intact(No nystagmus), symmetric smile. Strength:- 5/5 equal and symmetric strength both upper and lower extremities.          Assessment & Plan:   Patient Instructions  Hypertension.  Well-controlled today on recheck.  Also well controlled we will recheck at the 1 cm at your home.  Continue amlodipine 10 mg daily and chlorthalidone 25 mg daily.  Elevated blood sugar.  Continue regular exercise and low sugar diet.  Placing future metabolic panel and A1c.  Hyperlipidemia.  Well-controlled on last check.  Placing future lipid panel.  Continue atorvastatin.  History of colon polyps on last colonoscopy in 2014.  I do not see when is the recommended repeat date.  I placed a referral to gastroenterologist office today to get you scheduled for the recommended repeat date.  Some frequent urination at night.  Could be BPH.  Decided to go ahead and get PSA to see if any abnormal PSA elevation.  Counseled patient on study.  This elevated PSA with refer to urologist.  Please schedule fasting labs on the way out.  Can get Shingrix vaccine through the pharmacy.  Follow-up date to be determined after lab review.   Esperanza Richters, PA-C

## 2021-10-14 ENCOUNTER — Other Ambulatory Visit (INDEPENDENT_AMBULATORY_CARE_PROVIDER_SITE_OTHER): Payer: Medicare Other

## 2021-10-14 ENCOUNTER — Other Ambulatory Visit: Payer: Self-pay

## 2021-10-14 DIAGNOSIS — E785 Hyperlipidemia, unspecified: Secondary | ICD-10-CM | POA: Diagnosis not present

## 2021-10-14 DIAGNOSIS — R350 Frequency of micturition: Secondary | ICD-10-CM

## 2021-10-14 DIAGNOSIS — R739 Hyperglycemia, unspecified: Secondary | ICD-10-CM

## 2021-10-14 DIAGNOSIS — I10 Essential (primary) hypertension: Secondary | ICD-10-CM | POA: Diagnosis not present

## 2021-10-14 LAB — LIPID PANEL
Cholesterol: 157 mg/dL (ref 0–200)
HDL: 42.3 mg/dL (ref >39.00)
LDL Cholesterol: 103 mg/dL — ABNORMAL HIGH (ref 0–99)
NonHDL: 115.11
Total CHOL/HDL Ratio: 4
Triglycerides: 61 mg/dL (ref 0.0–149.0)
VLDL: 12.2 mg/dL (ref 0.0–40.0)

## 2021-10-14 LAB — CBC WITH DIFFERENTIAL/PLATELET
Basophils Absolute: 0 10*3/uL (ref 0.0–0.1)
Basophils Relative: 0.7 % (ref 0.0–3.0)
Eosinophils Absolute: 0.1 10*3/uL (ref 0.0–0.7)
Eosinophils Relative: 2.3 % (ref 0.0–5.0)
HCT: 44.1 % (ref 39.0–52.0)
Hemoglobin: 14.8 g/dL (ref 13.0–17.0)
Lymphocytes Relative: 15.9 % (ref 12.0–46.0)
Lymphs Abs: 1 10*3/uL (ref 0.7–4.0)
MCHC: 33.4 g/dL (ref 30.0–36.0)
MCV: 87.5 fl (ref 78.0–100.0)
Monocytes Absolute: 0.7 10*3/uL (ref 0.1–1.0)
Monocytes Relative: 10.6 % (ref 3.0–12.0)
Neutro Abs: 4.4 10*3/uL (ref 1.4–7.7)
Neutrophils Relative %: 70.5 % (ref 43.0–77.0)
Platelets: 229 10*3/uL (ref 150.0–400.0)
RBC: 5.05 Mil/uL (ref 4.22–5.81)
RDW: 13.1 % (ref 11.5–15.5)
WBC: 6.3 10*3/uL (ref 4.0–10.5)

## 2021-10-14 LAB — HEMOGLOBIN A1C: Hgb A1c MFr Bld: 5.9 % (ref 4.6–6.5)

## 2021-10-14 LAB — COMPREHENSIVE METABOLIC PANEL
ALT: 23 U/L (ref 0–53)
AST: 18 U/L (ref 0–37)
Albumin: 4.2 g/dL (ref 3.5–5.2)
Alkaline Phosphatase: 53 U/L (ref 39–117)
BUN: 24 mg/dL — ABNORMAL HIGH (ref 6–23)
CO2: 29 mEq/L (ref 19–32)
Calcium: 9 mg/dL (ref 8.4–10.5)
Chloride: 102 mEq/L (ref 96–112)
Creatinine, Ser: 1.09 mg/dL (ref 0.40–1.50)
GFR: 68.79 mL/min (ref >60.00)
Glucose, Bld: 102 mg/dL — ABNORMAL HIGH (ref 70–99)
Potassium: 4.2 mEq/L (ref 3.5–5.1)
Sodium: 138 mEq/L (ref 135–145)
Total Bilirubin: 1 mg/dL (ref 0.2–1.2)
Total Protein: 7 g/dL (ref 6.0–8.3)

## 2021-10-14 LAB — PSA: PSA: 0.28 ng/mL (ref 0.10–4.00)

## 2021-10-15 ENCOUNTER — Other Ambulatory Visit: Payer: Self-pay | Admitting: Medical

## 2021-10-20 ENCOUNTER — Other Ambulatory Visit (HOSPITAL_BASED_OUTPATIENT_CLINIC_OR_DEPARTMENT_OTHER): Payer: Self-pay

## 2021-10-20 MED ORDER — ZOSTER VAC RECOMB ADJUVANTED 50 MCG/0.5ML IM SUSR
INTRAMUSCULAR | 1 refills | Status: DC
  Filled 2021-10-20: qty 1, 1d supply, fill #0
  Filled 2022-02-14: qty 1, 1d supply, fill #1

## 2021-10-21 ENCOUNTER — Encounter: Payer: Self-pay | Admitting: Medical

## 2021-11-10 ENCOUNTER — Other Ambulatory Visit: Payer: Self-pay | Admitting: Medical

## 2021-11-15 ENCOUNTER — Telehealth: Payer: Self-pay | Admitting: Medical

## 2021-11-15 NOTE — Telephone Encounter (Signed)
In labs you told pt to follow up in 6 months , ok to do CPE at 6 month follow up or will he need more labs if he comes in 12/15

## 2021-11-15 NOTE — Telephone Encounter (Signed)
Pt states he unsure why he needs his cpe and says he got all his labs done 11/1 and would like a call to see if he needs to more labs done.

## 2021-11-16 NOTE — Telephone Encounter (Signed)
Spoke with pt stated he would rather do CPE at time of 6 month follow up

## 2021-11-18 ENCOUNTER — Encounter: Payer: PRIVATE HEALTH INSURANCE | Admitting: Medical

## 2021-12-06 ENCOUNTER — Other Ambulatory Visit: Payer: Self-pay | Admitting: Medical

## 2021-12-06 DIAGNOSIS — I10 Essential (primary) hypertension: Secondary | ICD-10-CM

## 2021-12-07 ENCOUNTER — Encounter: Payer: Self-pay | Admitting: Medical

## 2022-01-02 ENCOUNTER — Other Ambulatory Visit: Payer: Self-pay | Admitting: Medical

## 2022-02-06 ENCOUNTER — Other Ambulatory Visit: Payer: Self-pay | Admitting: Medical

## 2022-02-14 ENCOUNTER — Other Ambulatory Visit (HOSPITAL_BASED_OUTPATIENT_CLINIC_OR_DEPARTMENT_OTHER): Payer: Self-pay

## 2022-03-10 ENCOUNTER — Other Ambulatory Visit: Payer: Self-pay | Admitting: Medical

## 2022-03-31 ENCOUNTER — Ambulatory Visit: Payer: Medicare PPO | Attending: Internal Medicine

## 2022-03-31 DIAGNOSIS — Z23 Encounter for immunization: Secondary | ICD-10-CM

## 2022-03-31 NOTE — Progress Notes (Signed)
? ?  Covid-19 Vaccination Clinic ? ?Name:  Leroy Proctor    ?MRN: [id number] ?DOB: 1951/07/07 ? ?03/31/2022 ? ?Leroy Proctor was observed post Covid-19 immunization for 15 minutes without incident. He was provided with Vaccine Information Sheet and instruction to access the V-Safe system.  ? ?Leroy Proctor was instructed to call 911 with any severe reactions post vaccine: ?Difficulty breathing  ?Swelling of face and throat  ?A fast heartbeat  ?A bad rash all over body  ?Dizziness and weakness  ? ?Immunizations Administered   ? ? Name Date Dose VIS Date Route  ? Art gallery manager Booster 03/31/2022  3:23 PM 0.3 mL 08/04/2021 Intramuscular  ? Manufacturer: [employer]  ? Lot: [id number]  ? NDC: [phone]  ? ?  ?  ?

## 2022-04-01 ENCOUNTER — Other Ambulatory Visit (HOSPITAL_BASED_OUTPATIENT_CLINIC_OR_DEPARTMENT_OTHER): Payer: Self-pay

## 2022-04-01 MED ORDER — PFIZER COVID-19 VAC BIVALENT 30 MCG/0.3ML IM SUSP
INTRAMUSCULAR | 0 refills | Status: DC
  Filled 2022-04-01: qty 0.3, 1d supply, fill #0

## 2022-04-09 ENCOUNTER — Other Ambulatory Visit: Payer: Self-pay | Admitting: Medical

## 2022-04-13 ENCOUNTER — Telehealth: Payer: Self-pay | Admitting: Medical

## 2022-04-13 DIAGNOSIS — I10 Essential (primary) hypertension: Secondary | ICD-10-CM

## 2022-04-13 MED ORDER — ATORVASTATIN CALCIUM 20 MG PO TABS: 20.0000 mg | ORAL_TABLET | Freq: Every day | ORAL | 0 refills | Status: DC

## 2022-04-13 MED ORDER — AMLODIPINE BESYLATE 10 MG PO TABS: 10.0000 mg | ORAL_TABLET | Freq: Every day | ORAL | 0 refills | Status: DC

## 2022-04-13 MED ORDER — CHLORTHALIDONE 25 MG PO TABS: 25.0000 mg | ORAL_TABLET | Freq: Every day | ORAL | 0 refills | Status: DC

## 2022-04-13 NOTE — Telephone Encounter (Signed)
Pt called stating that he would like to have his amLODipine filled as a 90 day supply going forward as the rest of his meds are don the same way. ?

## 2022-04-13 NOTE — Telephone Encounter (Signed)
90 day supply sent in for all medications ?

## 2022-04-18 ENCOUNTER — Ambulatory Visit (INDEPENDENT_AMBULATORY_CARE_PROVIDER_SITE_OTHER): Payer: Medicare PPO

## 2022-04-18 DIAGNOSIS — Z0000 Encounter for general adult medical examination without abnormal findings: Secondary | ICD-10-CM

## 2022-04-18 NOTE — Patient Instructions (Signed)
Mr. Mcnab , ?Thank you for taking time to come for your Medicare Wellness Visit. I appreciate your ongoing commitment to your health goals. Please review the following plan we discussed and let me know if I can assist you in the future.  ? ?Screening recommendations/referrals: ?Colonoscopy: 11/21/13 due 11/22/23 ?Recommended yearly ophthalmology/optometry visit for glaucoma screening and checkup ?Recommended yearly dental visit for hygiene and checkup ? ?Vaccinations: ?Influenza vaccine: up to date ?Pneumococcal vaccine: up to date ?Tdap vaccine: up to date ?Shingles vaccine: up to date   ?Covid-19: completed ? ?Advanced directives: yes, not on file ? ?Conditions/risks identified: see problem list  ? ?Next appointment: Follow up in one year for your annual wellness visit.  ? ?Preventive Care 71 Years and Older, Male ?Preventive care refers to lifestyle choices and visits with your health care provider that can promote health and wellness. ?What does preventive care include? ?A yearly physical exam. This is also called an annual well check. ?Dental exams once or twice a year. ?Routine eye exams. Ask your health care provider how often you should have your eyes checked. ?Personal lifestyle choices, including: ?Daily care of your teeth and gums. ?Regular physical activity. ?Eating a healthy diet. ?Avoiding tobacco and drug use. ?Limiting alcohol use. ?Practicing safe sex. ?Taking low doses of aspirin every day. ?Taking vitamin and mineral supplements as recommended by your health care provider. ?What happens during an annual well check? ?The services and screenings done by your health care provider during your annual well check will depend on your age, overall health, lifestyle risk factors, and family history of disease. ?Counseling  ?Your health care provider may ask you questions about your: ?Alcohol use. ?Tobacco use. ?Drug use. ?Emotional well-being. ?Home and relationship well-being. ?Sexual activity. ?Eating  habits. ?History of falls. ?Memory and ability to understand (cognition). ?Work and work Statistician. ?Screening  ?You may have the following tests or measurements: ?Height, weight, and BMI. ?Blood pressure. ?Lipid and cholesterol levels. These may be checked every 5 years, or more frequently if you are over 56 years old. ?Skin check. ?Lung cancer screening. You may have this screening every year starting at age 57 if you have a 30-pack-year history of smoking and currently smoke or have quit within the past 15 years. ?Fecal occult blood test (FOBT) of the stool. You may have this test every year starting at age 46. ?Flexible sigmoidoscopy or colonoscopy. You may have a sigmoidoscopy every 5 years or a colonoscopy every 10 years starting at age 41. ?Prostate cancer screening. Recommendations will vary depending on your family history and other risks. ?Hepatitis C blood test. ?Hepatitis B blood test. ?Sexually transmitted disease (STD) testing. ?Diabetes screening. This is done by checking your blood sugar (glucose) after you have not eaten for a while (fasting). You may have this done every 1-3 years. ?Abdominal aortic aneurysm (AAA) screening. You may need this if you are a current or former smoker. ?Osteoporosis. You may be screened starting at age 52 if you are at high risk. ?Talk with your health care provider about your test results, treatment options, and if necessary, the need for more tests. ?Vaccines  ?Your health care provider may recommend certain vaccines, such as: ?Influenza vaccine. This is recommended every year. ?Tetanus, diphtheria, and acellular pertussis (Tdap, Td) vaccine. You may need a Td booster every 10 years. ?Zoster vaccine. You may need this after age 52. ?Pneumococcal 13-valent conjugate (PCV13) vaccine. One dose is recommended after age 43. ?Pneumococcal polysaccharide (PPSV23) vaccine. One dose is  recommended after age 63. ?Talk to your health care provider about which screenings and  vaccines you need and how often you need them. ?This information is not intended to replace advice given to you by your health care provider. Make sure you discuss any questions you have with your health care provider. ?Document Released: 12/18/2015 Document Revised: 08/10/2016 Document Reviewed: 09/22/2015 ?Elsevier Interactive Patient Education ? 2017 [employer]. ? ?Fall Prevention in the Home ?Falls can cause injuries. They can happen to people of all ages. There are many things you can do to make your home safe and to help prevent falls. ?What can I do on the outside of my home? ?Regularly fix the edges of walkways and driveways and fix any cracks. ?Remove anything that might make you trip as you walk through a door, such as a raised step or threshold. ?Trim any bushes or trees on the path to your home. ?Use bright outdoor lighting. ?Clear any walking paths of anything that might make someone trip, such as rocks or tools. ?Regularly check to see if handrails are loose or broken. Make sure that both sides of any steps have handrails. ?Any raised decks and porches should have guardrails on the edges. ?Have any leaves, snow, or ice cleared regularly. ?Use sand or salt on walking paths during winter. ?Clean up any spills in your garage right away. This includes oil or grease spills. ?What can I do in the bathroom? ?Use night lights. ?Install grab bars by the toilet and in the tub and shower. Do not use towel bars as grab bars. ?Use non-skid mats or decals in the tub or shower. ?If you need to sit down in the shower, use a plastic, non-slip stool. ?Keep the floor dry. Clean up any water that spills on the floor as soon as it happens. ?Remove soap buildup in the tub or shower regularly. ?Attach bath mats securely with double-sided non-slip rug tape. ?Do not have throw rugs and other things on the floor that can make you trip. ?What can I do in the bedroom? ?Use night lights. ?Make sure that you have a light by your  bed that is easy to reach. ?Do not use any sheets or blankets that are too big for your bed. They should not hang down onto the floor. ?Have a firm chair that has side arms. You can use this for support while you get dressed. ?Do not have throw rugs and other things on the floor that can make you trip. ?What can I do in the kitchen? ?Clean up any spills right away. ?Avoid walking on wet floors. ?Keep items that you use a lot in easy-to-reach places. ?If you need to reach something above you, use a strong step stool that has a grab bar. ?Keep electrical cords out of the way. ?Do not use floor polish or wax that makes floors slippery. If you must use wax, use non-skid floor wax. ?Do not have throw rugs and other things on the floor that can make you trip. ?What can I do with my stairs? ?Do not leave any items on the stairs. ?Make sure that there are handrails on both sides of the stairs and use them. Fix handrails that are broken or loose. Make sure that handrails are as long as the stairways. ?Check any carpeting to make sure that it is firmly attached to the stairs. Fix any carpet that is loose or worn. ?Avoid having throw rugs at the top or bottom of the stairs. If  you do have throw rugs, attach them to the floor with carpet tape. ?Make sure that you have a light switch at the top of the stairs and the bottom of the stairs. If you do not have them, ask someone to add them for you. ?What else can I do to help prevent falls? ?Wear shoes that: ?Do not have high heels. ?Have rubber bottoms. ?Are comfortable and fit you well. ?Are closed at the toe. Do not wear sandals. ?If you use a stepladder: ?Make sure that it is fully opened. Do not climb a closed stepladder. ?Make sure that both sides of the stepladder are locked into place. ?Ask someone to hold it for you, if possible. ?Clearly mark and make sure that you can see: ?Any grab bars or handrails. ?First and last steps. ?Where the edge of each step is. ?Use tools that  help you move around (mobility aids) if they are needed. These include: ?Canes. ?Walkers. ?Scooters. ?Crutches. ?Turn on the lights when you go into a dark area. Replace any light bulbs as soon as they

## 2022-04-18 NOTE — Progress Notes (Addendum)
? ?Subjective:  ? Leroy Proctor is a 71 y.o. male who presents for an Initial Medicare Annual Wellness Visit. ? ?I connected with  [name] on 04/18/22 by a audio enabled telemedicine application and verified that I am speaking with the correct person using two identifiers. ? ?Patient Location: Home ? ?Provider Location: Office/Clinic ? ?I discussed the limitations of evaluation and management by telemedicine. The patient expressed understanding and agreed to proceed.  ? ?Review of Systems    ? ?Cardiac Risk Factors include: advanced age (>33mn, >>74women);hypertension;dyslipidemia ? ?   ?Objective:  ?  ?There were no vitals filed for this visit. ?There is no height or weight on file to calculate BMI. ? ? ?  04/18/2022  ?  1:02 PM  ?Advanced Directives  ?Does Patient Have a Medical Advance Directive? Yes  ?Type of AParamedicof AVictoriaOut of facility DNR (pink MOST or yellow form);Living will  ?Copy of [place]? No - copy requested  ? ? ?Current Medications (verified) ?Outpatient Encounter Medications as of 04/18/2022  ?Medication Sig  ? amLODipine (NORVASC) 10 MG tablet Take 1 tablet (10 mg total) by mouth daily.  ? atorvastatin (LIPITOR) 20 MG tablet Take 1 tablet (20 mg total) by mouth daily.  ? chlorthalidone (HYGROTON) 25 MG tablet Take 1 tablet (25 mg total) by mouth daily.  ? COVID-19 mRNA bivalent vaccine, Pfizer, (PFIZER COVID-19 VAC BIVALENT) injection Inject into the muscle.  ? COVID-19 mRNA Vac-TriS, Pfizer, (PFIZER-BIONT COVID-19 VAC-TRIS) SUSP injection Inject into the muscle.  ? influenza vaccine adjuvanted (FLUAD) 0.5 ML injection Inject into the muscle.  ? tadalafil (CIALIS) 20 MG tablet Take 0.5-1 tablets (10-20 mg total) by mouth every other day as needed for erectile dysfunction.  ? Zoster Vaccine Adjuvanted ([facility] injection Inject into the muscle.  ? [DISCONTINUED] aspirin 81 MG tablet Take 81 mg by mouth daily.  ? ?No  facility-administered encounter medications on file as of 04/18/2022.  ? ? ?Allergies (verified) ?Atenolol and Demerol  ? ?History: ?Past Medical History:  ?Diagnosis Date  ? Chronic renal insufficiency, stage II (mild)   ? CrCl 60s  ? Erectile dysfunction   ? Hearing impaired   ? Herpes zoster   ? R side of face  ? Hyperlipidemia   ? Hypertension   ? Impaired fasting glucose 2013-2015  ? Personal history of kidney stones   ? Recurrent herpes labialis   ? ?Past Surgical History:  ?Procedure Laterality Date  ? COLONOSCOPY  2002; 2014  ? Repeat 2024  ? LITHOTRIPSY    ? ?Family History  ?Problem Relation Age of Onset  ? Colon cancer Neg Hx   ? ?Social History  ? ?Socioeconomic History  ? Marital status: Married  ?  Spouse name: Not on file  ? Number of children: Not on file  ? Years of education: Not on file  ? Highest education level: Not on file  ?Occupational History  ? Not on file  ?Tobacco Use  ? Smoking status: Former  ? Smokeless tobacco: Never  ?Vaping Use  ? Vaping Use: Never used  ?Substance and Sexual Activity  ? Alcohol use: Yes  ?  Alcohol/week: 2.0 standard drinks  ?  Types: 1 Glasses of wine, 1 Cans of beer per week  ? Drug use: No  ? Sexual activity: Not on file  ?Other Topics Concern  ? Not on file  ?Social History Narrative  ? Married, no children.  ? Works  in [employer] for town of [place].  ? No T/A/Ds.  ? Exercise: stationary bike/nautilus 5 days a week.  ?   ?   ? ?Social Determinants of Health  ? ?Financial Resource Strain: Not on file  ?Food Insecurity: Not on file  ?Transportation Needs: Not on file  ?Physical Activity: Not on file  ?Stress: Not on file  ?Social Connections: Not on file  ? ? ?Tobacco Counseling ?Counseling given: Not Answered ? ? ?Clinical Intake: ? ?Pre-visit preparation completed: Yes ? ?Pain : No/denies pain ? ?  ? ?Nutritional Risks: None ?Diabetes: No ? ?How often do you need to have someone help you when you read instructions, pamphlets, or other written materials from  your doctor or pharmacy?: 1 - Never ? ?Diabetic?No ? ?Interpreter Needed?: No ? ?Information entered by :: Aditya Nastasi ? ? ?Activities of Daily Living ? ?  04/18/2022  ?  1:04 PM  ?In your present state of health, do you have any difficulty performing the following activities:  ?Hearing? 1  ?Vision? 1  ?Difficulty concentrating or making decisions? 0  ?Walking or climbing stairs? 1  ?Dressing or bathing? 0  ?Doing errands, shopping? 0  ?Preparing Food and eating ? N  ?Using the Toilet? N  ?In the past six months, have you accidently leaked urine? N  ?Do you have problems with loss of bowel control? N  ?Managing your Medications? N  ?Managing your Finances? N  ?Housekeeping or managing your Housekeeping? N  ? ? ?Patient Care Team: ?Saguier, Iris Pert as PCP - General (Internal Medicine) ? ?Indicate any recent Medical Services you may have received from other than [facility] providers in the past year (date may be approximate). ? ?   ?Assessment:  ? This is a routine wellness examination for Leroy Proctor. ? ?Hearing/Vision screen ?No results found. ? ?Dietary issues and exercise activities discussed: ?Current Exercise Habits: Home exercise routine, Type of exercise: walking;stretching, Time (Minutes): 60, Frequency (Times/Week): 7, Weekly Exercise (Minutes/Week): 420, Intensity: Mild ? ? Goals Addressed   ?None ?  ? ?Depression Screen ? ?  04/18/2022  ?  1:03 PM 10/05/2021  ? 10:57 AM 05/21/2019  ?  8:30 AM 11/07/2018  ? 10:05 AM 11/06/2017  ?  9:10 AM  ?PHQ 2/9 Scores  ?PHQ - 2 Score 0 0 0 0 0  ?  ?Fall Risk ? ?  04/18/2022  ?  1:03 PM 10/05/2021  ? 10:57 AM 05/21/2019  ?  8:30 AM 11/07/2018  ? 10:05 AM 11/06/2017  ?  9:10 AM  ?Fall Risk   ?Falls in the past year? 0 0 0 0 No  ?Number falls in past yr: 0 0 0    ?Injury with Fall? 0 0 0    ?Risk for fall due to : No Fall Risks      ?Follow up Falls evaluation completed  Falls evaluation completed    ? ? ?FALL RISK PREVENTION PERTAINING TO THE HOME: ? ?Any stairs in or around the home? Yes   ?If so, are there any without handrails? Yes  ?Home free of loose throw rugs in walkways, pet beds, electrical cords, etc? Yes  ?Adequate lighting in your home to reduce risk of falls? Yes  ? ?ASSISTIVE DEVICES UTILIZED TO PREVENT FALLS: ? ?Life alert? Yes  ?Use of a cane, walker or w/c? No  ?Grab bars in the bathroom? Yes  ?Shower chair or bench in shower? No  ?Elevated toilet seat or a handicapped toilet? No  ? ?  TIMED UP AND GO: ? ?Was the test performed? No .  ? ? ?Cognitive Function: ?  ?  ? ?  04/18/2022  ?  1:06 PM  ?6CIT Screen  ?What Year? 0 points  ?What time? 0 points  ?Count back from 20 0 points  ?Months in reverse 0 points  ?Repeat phrase 0 points  ? ? ?Immunizations ?Immunization History  ?Administered Date(s) Administered  ? Fluad Quad(high Dose 65+) 11/17/2020, 09/10/2021  ? Influenza Split 12/16/2011  ? Influenza, High Dose Seasonal PF 09/14/2016, 11/06/2017, 08/20/2018, 08/31/2019  ? Influenza,inj,Quad PF,6+ Mos 10/02/2013, 09/19/2014, 11/03/2015  ? PFIZER Comirnaty(Gray Top)Covid-19 Tri-Sucrose Vaccine 03/16/2021  ? PFIZER(Purple Top)SARS-COV-2 Vaccination 01/08/2020, 01/28/2020, 08/29/2020  ? Pension scheme manager 85yr & up 03/31/2022  ? Pneumococcal Conjugate-13 11/03/2016  ? Pneumococcal Polysaccharide-23 11/06/2017, 08/31/2019  ? Tdap 07/21/2006, 08/20/2018  ? Zoster Recombinat (Shingrix) 07/22/2017, 10/16/2017, 10/20/2021  ? ? ?TDAP status: Up to date ? ?Flu Vaccine status: Up to date ? ?Pneumococcal vaccine status: Up to date ? ?Covid-19 vaccine status: Completed vaccines ? ?Qualifies for Shingles Vaccine? Yes   ?Zostavax completed No   ?Shingrix Completed?: Yes ? ?Screening Tests ?Health Maintenance  ?Topic Date Due  ? INFLUENZA VACCINE  07/05/2022  ? COLONOSCOPY (Pts [id number] coverage will need to be confirmed)  11/22/2023  ? TETANUS/TDAP  08/20/2028  ? Pneumonia Vaccine 6599Years old  Completed  ? COVID-19 Vaccine  Completed  ? Hepatitis C Screening   Completed  ? Zoster Vaccines- Shingrix  Completed  ? HPV VACCINES  Aged Out  ? ? ?Health Maintenance ? ?There are no preventive care reminders to display for this patient. ? ?Colorectal cancer screening: Type of screenin

## 2022-04-19 ENCOUNTER — Encounter: Payer: Self-pay | Admitting: Medical

## 2022-04-19 ENCOUNTER — Ambulatory Visit (INDEPENDENT_AMBULATORY_CARE_PROVIDER_SITE_OTHER): Payer: Medicare PPO | Admitting: Medical

## 2022-04-19 VITALS — BP 138/75 | HR 52 | Resp 18 | Ht 68.0 in | Wt 199.6 lb

## 2022-04-19 DIAGNOSIS — R739 Hyperglycemia, unspecified: Secondary | ICD-10-CM

## 2022-04-19 DIAGNOSIS — E785 Hyperlipidemia, unspecified: Secondary | ICD-10-CM

## 2022-04-19 DIAGNOSIS — R1310 Dysphagia, unspecified: Secondary | ICD-10-CM | POA: Diagnosis not present

## 2022-04-19 DIAGNOSIS — I10 Essential (primary) hypertension: Secondary | ICD-10-CM | POA: Diagnosis not present

## 2022-04-19 DIAGNOSIS — R350 Frequency of micturition: Secondary | ICD-10-CM

## 2022-04-19 LAB — COMPREHENSIVE METABOLIC PANEL
ALT: 20 U/L (ref 0–53)
AST: 15 U/L (ref 0–37)
Albumin: 4.3 g/dL (ref 3.5–5.2)
Alkaline Phosphatase: 55 U/L (ref 39–117)
BUN: 20 mg/dL (ref 6–23)
CO2: 28 mEq/L (ref 19–32)
Calcium: 9.3 mg/dL (ref 8.4–10.5)
Chloride: 103 mEq/L (ref 96–112)
Creatinine, Ser: 0.96 mg/dL (ref 0.40–1.50)
GFR: 79.83 mL/min (ref >60.00)
Glucose, Bld: 103 mg/dL — ABNORMAL HIGH (ref 70–99)
Potassium: 4.1 mEq/L (ref 3.5–5.1)
Sodium: 139 mEq/L (ref 135–145)
Total Bilirubin: 0.7 mg/dL (ref 0.2–1.2)
Total Protein: 7.2 g/dL (ref 6.0–8.3)

## 2022-04-19 LAB — LIPID PANEL
Cholesterol: 168 mg/dL (ref 0–200)
HDL: 49.8 mg/dL (ref >39.00)
LDL Cholesterol: 99 mg/dL (ref 0–99)
NonHDL: 118.05
Total CHOL/HDL Ratio: 3
Triglycerides: 96 mg/dL (ref 0.0–149.0)
VLDL: 19.2 mg/dL (ref 0.0–40.0)

## 2022-04-19 LAB — PSA: PSA: 0.24 ng/mL (ref 0.10–4.00)

## 2022-04-19 LAB — HEMOGLOBIN A1C: Hgb A1c MFr Bld: 5.7 % (ref 4.6–6.5)

## 2022-04-19 NOTE — Addendum Note (Signed)
Addended by: Anabel Halon on: 04/19/2022 08:28 AM ? ? Modules accepted: Orders ? ?

## 2022-04-19 NOTE — Progress Notes (Signed)
? ?Subjective:  ? ? Patient ID: Leroy Proctor, male    DOB: 1951-07-15, 71 y.o.   MRN: [id number] ? ?HPI ? ?Pt in for follow up. ? ?Htn- on amlodipine 10 mg daily and chorthalidone 25 mg daily. At gym when he checks bp it is in good range 120/70 or little lower. He goes to gym 5 days a week. ? ?High cholesterol-on atorvastatin 20 mg daily ? ?Elevated sugar-minimal in past but on review a1c less than 6.5. ? ? ?Occasional while eating he feels like food get stuck briefly. Occurring every 2 weeks. He states sometimes will take 2 minutes to feel like food passes. He notes occurs with foods like chips and salsa. Also he thinks will occur if he eats to fast. He states occasional heart burn. He gets heart burn every once in a while but not associated with food getting stuck when eating. This has been going on for 2 months. ? ? ? ?Review of Systems  ?Constitutional:  Negative for chills, fatigue and fever.  ?HENT:  Negative for dental problem.   ?Respiratory:  Negative for cough, chest tightness and wheezing.   ?Cardiovascular:  Negative for chest pain and palpitations.  ?Gastrointestinal:  Negative for abdominal pain, blood in stool and constipation.  ?Genitourinary:  Negative for dysuria and flank pain.  ?Musculoskeletal:  Negative for back pain and myalgias.  ?Skin:  Negative for rash.  ?Neurological:  Negative for dizziness, light-headedness and numbness.  ?Hematological:  Negative for adenopathy. Does not bruise/bleed easily.  ?Psychiatric/Behavioral:  Negative for behavioral problems and decreased concentration.   ? ? ?Past Medical History:  ?Diagnosis Date  ? Chronic renal insufficiency, stage II (mild)   ? CrCl 60s  ? Erectile dysfunction   ? Hearing impaired   ? Herpes zoster   ? R side of face  ? Hyperlipidemia   ? Hypertension   ? Impaired fasting glucose 2013-2015  ? Personal history of kidney stones   ? Recurrent herpes labialis   ? ?  ?Social History  ? ?Socioeconomic History  ? Marital status: Married  ?   Spouse name: Not on file  ? Number of children: Not on file  ? Years of education: Not on file  ? Highest education level: Not on file  ?Occupational History  ? Not on file  ?Tobacco Use  ? Smoking status: Former  ? Smokeless tobacco: Never  ?Vaping Use  ? Vaping Use: Never used  ?Substance and Sexual Activity  ? Alcohol use: Yes  ?  Alcohol/week: 2.0 standard drinks  ?  Types: 1 Glasses of wine, 1 Cans of beer per week  ? Drug use: No  ? Sexual activity: Not on file  ?Other Topics Concern  ? Not on file  ?Social History Narrative  ? Married, no children.  ? 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  ? No T/A/Ds.  ? Exercise: stationary bike/nautilus 5 days a week.  ?   ?   ? ?Social Determinants of Health  ? ?Financial Resource Strain: Low Risk   ? Difficulty of Paying Living Expenses: Not hard at all  ?Food Insecurity: No Food Insecurity  ? Worried About Programme researcher, broadcasting/film/video in the Last Year: Never true  ? Ran Out of Food in the Last Year: Never true  ?Transportation Needs: No Transportation Needs  ? Lack of Transportation (Medical): No  ? Lack of Transportation (Non-Medical): No  ?Physical Activity: Sufficiently Active  ? Days of Exercise per Week: 7  days  ? Minutes of Exercise per Session: 60 min  ?Stress: No Stress Concern Present  ? Feeling of Stress : Not at all  ?Social Connections: Moderately Isolated  ? Frequency of Communication with Friends and Family: More than three times a week  ? Frequency of Social Gatherings with Friends and Family: Once a week  ? Attends Religious Services: Never  ? Active Member of Clubs or Organizations: No  ? Attends Banker Meetings: Never  ? Marital Status: Married  ?Intimate Partner Violence: Not At Risk  ? Fear of Current or Ex-Partner: No  ? Emotionally Abused: No  ? Physically Abused: No  ? Sexually Abused: No  ? ? ?Past Surgical History:  ?Procedure Laterality Date  ? COLONOSCOPY  2002; 2014  ? Repeat 2024  ? LITHOTRIPSY    ? ? ?Family History  ?Problem  Relation Age of Onset  ? Colon cancer Neg Hx   ? ? ?Allergies  ?Allergen Reactions  ? Atenolol   ?  bradycarda- heart rate down to 38 bpm  ? Demerol Nausea And Vomiting  ?  vomiting  ? ? ?Current Outpatient Medications on File Prior to Visit  ?Medication Sig Dispense Refill  ? amLODipine (NORVASC) 10 MG tablet Take 1 tablet (10 mg total) by mouth daily. 90 tablet 0  ? atorvastatin (LIPITOR) 20 MG tablet Take 1 tablet (20 mg total) by mouth daily. 90 tablet 0  ? chlorthalidone (HYGROTON) 25 MG tablet Take 1 tablet (25 mg total) by mouth daily. 90 tablet 0  ? tadalafil (CIALIS) 20 MG tablet Take 0.5-1 tablets (10-20 mg total) by mouth every other day as needed for erectile dysfunction. 10 tablet 1  ? ?No current facility-administered medications on file prior to visit.  ? ? ?BP (!) 151/76   Pulse (!) 52   Resp 18   Ht 5\' 8"  (1.727 m)   Wt 199 lb 9.6 oz (90.5 kg)   SpO2 96%   BMI 30.35 kg/m?  ?  ?   ?Objective:  ? Physical Exam ? ?General ?Mental Status- Alert. General Appearance- Not in acute distress.  ? ?Skin ?General: Color- Normal Color. Moisture- Normal Moisture. ? ?Neck ?Carotid Arteries- Normal color. Moisture- Normal Moisture. No carotid bruits. No JVD. ? ?Chest and Lung Exam ?Auscultation: ?Breath Sounds:-Normal. ? ?Cardiovascular ?Auscultation:Rythm- Regular. ?Murmurs & Other Heart Sounds:Auscultation of the heart reveals- No Murmurs. ? ?Abdomen ?Inspection:-Inspeection Normal. ?Palpation/Percussion:Note:No mass. Palpation and Percussion of the abdomen reveal- Non Tender, Non Distended + BS, no rebound or guarding. ? ? ?Neurologic ?Cranial Nerve exam:- CN III-XII intact(No nystagmus), symmetric smile. ?Strength:- 5/5 equal and symmetric strength both upper and lower extremities.  ? ? ?   ?Assessment & Plan:  ? ?Patient Instructions  ?Htn- bp is better controlled on recheck today and good levels when you check at gym. Continue  amlodipine 10 mg daily and chorthalidone 25 mg daily. ? ?High cholesterol-  recheck cmp and lipid panel today. On atorvastatin presently.  ? ?Mild increase sugar level in past with normal a1c. Repeat a1c today. ? ?Intermittent episodes of minimal discomfort/painful  and transient difficutly swallowing. Will refer to GI MD to evaluate if may need egd or other study. ? ?Follow up in 6 months or sooner if needed. ? ?  ?Esperanza Richters, PA-C  ?

## 2022-04-19 NOTE — Patient Instructions (Addendum)
Htn- bp is better controlled on recheck today and good levels when you check at gym. Continue  amlodipine 10 mg daily and chorthalidone 25 mg daily. ? ?High cholesterol- recheck cmp and lipid panel today. On atorvastatin presently.  ? ?Mild increase sugar level in past with normal a1c. Repeat a1c today. ? ?Intermittent episodes of minimal discomfort/painful  and transient difficutly swallowing. Will refer to GI MD to evaluate if may need egd or other study. ? ?Follow up in 6 months or sooner if needed. ? ? ?

## 2022-05-24 IMAGING — DX DG HAND COMPLETE 3+V*R*
3 series · 3 of 3 positions shown · non-contrast
Comparison: None.

CLINICAL DATA: 69-year-old male with right hand pain.

EXAM:
RIGHT HAND - COMPLETE 3+ VIEW

[hand pa]
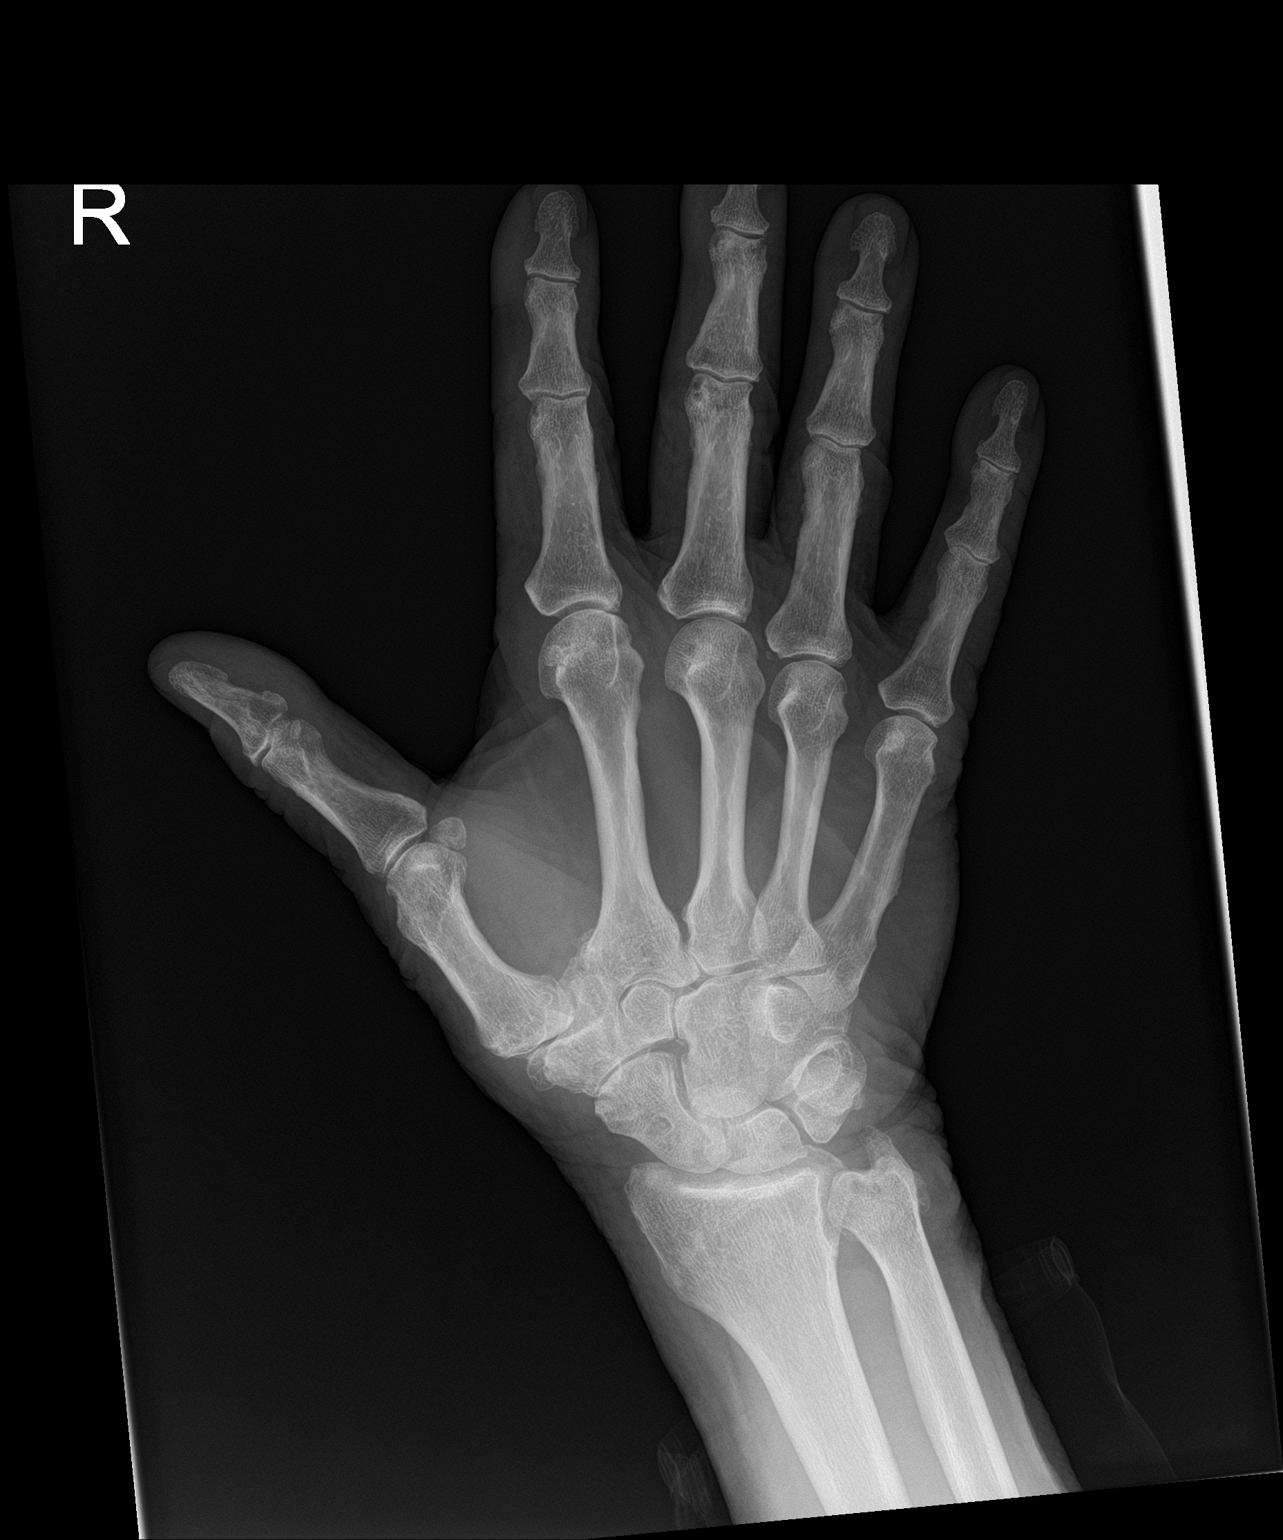

[hand obl]
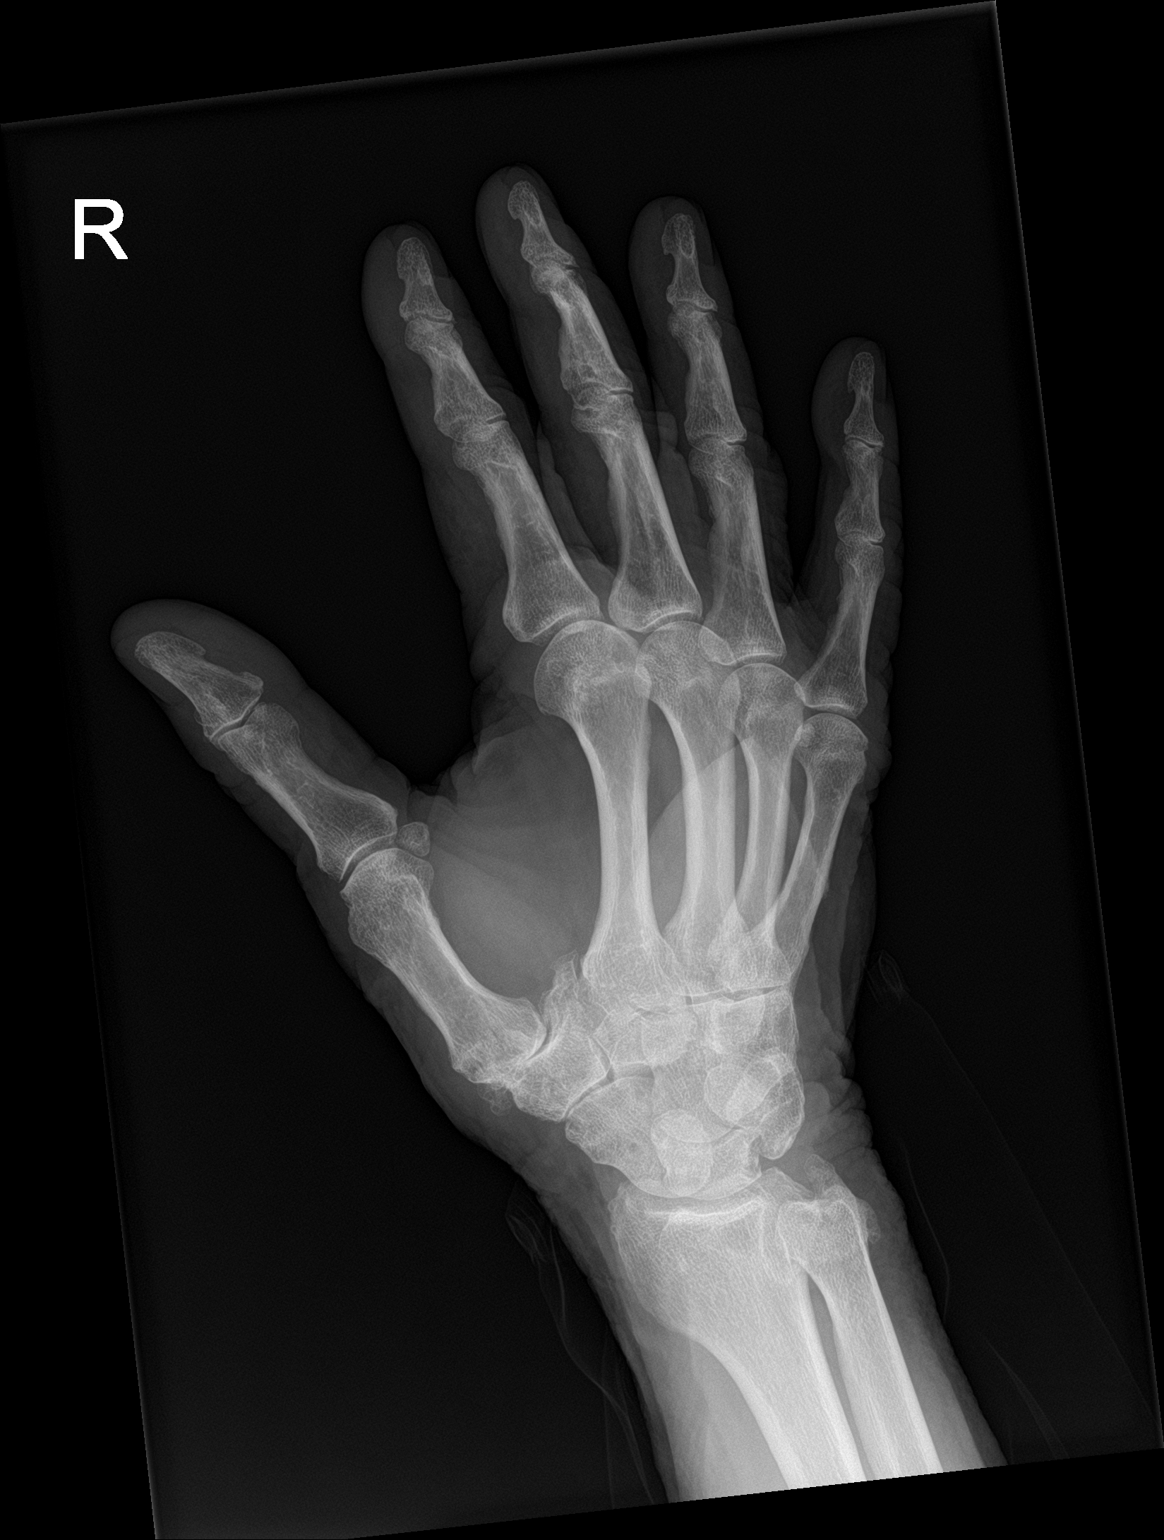

[hand lat]
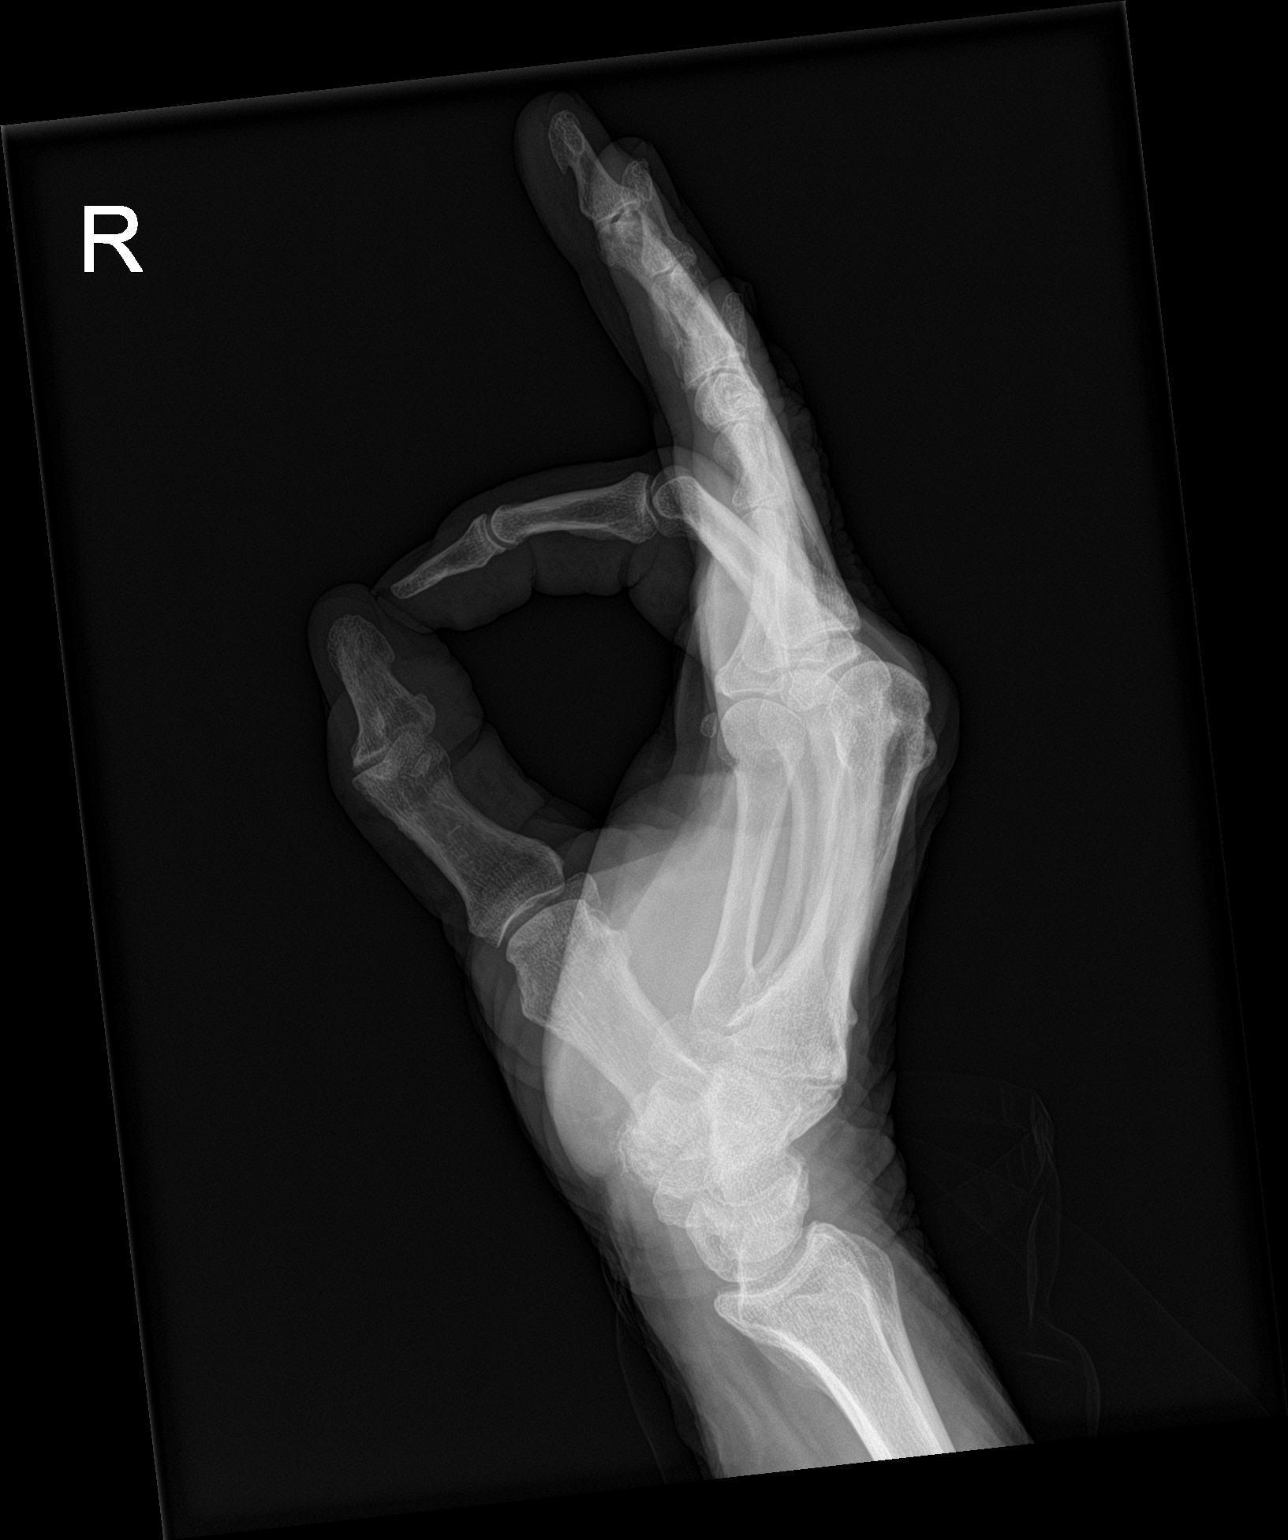

[3 of 3 positions shown; findings below may reference images not displayed]

FINDINGS: There is no acute fracture or dislocation. Mild osteopenia. There is
arthritic changes of the base of the thumb. The soft tissues are
unremarkable.
IMPRESSION: No acute fracture or dislocation.

## 2022-06-03 ENCOUNTER — Other Ambulatory Visit: Payer: Self-pay | Admitting: Medical

## 2022-06-13 ENCOUNTER — Encounter: Payer: Self-pay | Admitting: Medical

## 2022-06-28 ENCOUNTER — Encounter: Payer: Self-pay | Admitting: Nurse Practitioner

## 2022-07-08 ENCOUNTER — Other Ambulatory Visit: Payer: Self-pay | Admitting: Medical

## 2022-07-28 ENCOUNTER — Ambulatory Visit: Payer: Medicare PPO | Admitting: Nurse Practitioner

## 2022-07-28 ENCOUNTER — Encounter: Payer: Self-pay | Admitting: Nurse Practitioner

## 2022-07-28 VITALS — BP 126/70 | HR 62 | Ht 68.0 in | Wt 194.0 lb

## 2022-07-28 DIAGNOSIS — R1310 Dysphagia, unspecified: Secondary | ICD-10-CM | POA: Diagnosis not present

## 2022-07-28 MED ORDER — OMEPRAZOLE 20 MG PO CPDR: 20.0000 mg | DELAYED_RELEASE_CAPSULE | Freq: Every day | ORAL | 1 refills | Status: DC

## 2022-07-28 NOTE — Patient Instructions (Addendum)
1) TAKE OMEPRAZOLE '20MG'$  ONE CAPSULE BY MOUTH ONCE DAILY 30 MINUTES BEFORE BREAKFAST. A PRESCRIPTION WAS SENT TO YOUR PHARMACY  2) CUT YOUR FOOD INTO SMALL PIECES AND CHEW FOOD THOROUGHLY. AVOID LARGE PIECES OF MEAT/BREAD.   3) CONTACT OUR OFFICE IF YOUR SYMPTOMS WORSEN  4) we have scheduled you for an endoscopy.

## 2022-07-28 NOTE — Progress Notes (Signed)
07/28/2022 Leroy Proctor [id number] [birth year]   CHIEF COMPLAINT: Difficulty swallowing, heartburn   HISTORY OF PRESENT ILLNESS: Leroy Proctor is a 71 year old male with a past medical history of hypertension, hyperlipidemia, hearing impaired, renal insufficiency, kidney stones and colon polyps. He presents to our office today as referred by Esperanza Richters PA-C for further evaluation regarding dysphagia.  Approximately 3 months ago, he took his wife for a checkup and while he waited, he ate chips and salsa which became stuck in his mid esophagus.  He attempted to drink water but the water came right back out.  The stuck chips and salsa past 10 minutes later.  Since then, he has experienced increased heartburn and burping which has worsened over the past 3 weeks.  No further episodes of dysphagia but he describes having vague episodes when he feels as if food might get stuck but it does not happen.  He recalled a few years ago he was eating food with garlic which became briefly stuck in the esophagus but passed within a few seconds.  He took an OTC acid reducer for a few days without significant improvement.  He is taking Pepto-Bismol 1 tab daily which has reduced his heartburn.  No recent antibiotic or steroid use.  He denies having any upper or lower abdominal pain.  He is passing normal formed brown bowel movement daily.  No rectal bleeding or black stools.     Latest Ref Rng & Units 10/14/2021    8:29 AM 11/17/2020    8:42 AM 11/14/2019    9:17 AM  CBC  WBC 4.0 - 10.5 K/uL 6.3  4.9  6.3   Hemoglobin 13.0 - 17.0 g/dL 40.3  47.4  25.9   Hematocrit 39.0 - 52.0 % 44.1  45.8  44.8   Platelets 150.0 - 400.0 K/uL 229.0  242.0  281.0         Latest Ref Rng & Units 04/19/2022    8:30 AM 10/14/2021    8:29 AM 11/17/2020    8:42 AM  CMP  Glucose 70 - 99 mg/dL 563  875  643   BUN 6 - 23 mg/dL 20  24  19    Creatinine 0.40 - 1.50 mg/dL 3.29  5.18  8.41   Sodium 135 - 145 mEq/L 139  138  137    Potassium 3.5 - 5.1 mEq/L 4.1  4.2  4.2   Chloride 96 - 112 mEq/L 103  102  100   CO2 19 - 32 mEq/L 28  29  28    Calcium 8.4 - 10.5 mg/dL 9.3  9.0  9.3   Total Protein 6.0 - 8.3 g/dL 7.2  7.0  7.5   Total Bilirubin 0.2 - 1.2 mg/dL 0.7  1.0  1.1   Alkaline Phos 39 - 117 U/L 55  53  55   AST 0 - 37 U/L 15  18  21    ALT 0 - 53 U/L 20  23  22      Colonoscopy 11/21/2013: -2 hyperplastic polyps removed from the sigmoid and rectum -Sigmoid diverticulosis -10 year colonoscopy recall   Past Medical History:  Diagnosis Date   Chronic renal insufficiency, stage II (mild)    CrCl 60s   Erectile dysfunction    Hearing impaired    Herpes zoster    R side of face   Hyperlipidemia    Hypertension    Impaired fasting glucose 2013-2015   Personal history of kidney stones  Recurrent herpes labialis    Past Surgical History:  Procedure Laterality Date   COLONOSCOPY  2002; 2014   Repeat 2024   LITHOTRIPSY     Social History: Married.  Past smoker.  Infrequent alcohol use.  No drug use.  Family History: No known family history of esophageal, gastric or colon cancer.   Allergies  Allergen Reactions   Atenolol     bradycarda- heart rate down to 38 bpm   Demerol Nausea And Vomiting    vomiting      Outpatient Encounter Medications as of 07/28/2022  Medication Sig   amLODipine (NORVASC) 10 MG tablet TAKE ONE TABLET BY MOUTH DAILY   atorvastatin (LIPITOR) 20 MG tablet TAKE ONE TABLET BY MOUTH DAILY   chlorthalidone (HYGROTON) 25 MG tablet Take 1 tablet (25 mg total) by mouth daily.   tadalafil (CIALIS) 20 MG tablet TAKE 1/2 TO TAKE ONE TABLET BY MOUTH EVERY OTHER DAY AS NEEDED FOR ERECTILE DYSFUNCTION   No facility-administered encounter medications on file as of 07/28/2022.    REVIEW OF SYSTEMS:  Gen: Denies fever, sweats or chills. No weight loss.  CV: Denies chest pain, palpitations or edema. Resp: Denies cough, shortness of breath of hemoptysis.  GI: See HPI.  GU : Denies  urinary burning, blood in urine, increased urinary frequency or incontinence. MS: Denies joint pain, muscles aches or weakness. Derm: Denies rash, itchiness, skin lesions or unhealing ulcers. Psych: Denies depression, anxiety or memory loss. Heme: Denies bruising, easy bleeding. Neuro:  Denies headaches, dizziness or paresthesias. Endo:  Denies any problems with DM, thyroid or adrenal function.  PHYSICAL EXAM: BP 126/70   Pulse 62   Ht 5\' 8"  (1.727 m)   Wt 194 lb (88 kg)   BMI 29.50 kg/m   Wt Readings from Last 3 Encounters:  07/28/22 194 lb (88 kg)  04/19/22 199 lb 9.6 oz (90.5 kg)  10/05/21 198 lb (89.8 kg)    General: 71 year old male in no acute distress. Head: Normocephalic and atraumatic. Mild head tremor Eyes:  Sclerae non-icteric, conjunctive pink. Ears: Normal auditory acuity. Mouth: Upper dentures intact. No ulcers or lesions.  Neck: Supple, no lymphadenopathy or thyromegaly.  Lungs: Clear bilaterally to auscultation without wheezes, crackles or rhonchi. Heart: Regular rate and rhythm. No murmur, rub or gallop appreciated.  Abdomen: Soft, nontender, non distended. No masses. No hepatosplenomegaly. Normoactive bowel sounds x 4 quadrants.  Rectal: Deferred. Musculoskeletal: Symmetrical with no gross deformities. Skin: Warm and dry. No rash or lesions on visible extremities. Extremities: No edema. Neurological: Alert oriented x 4, no focal deficits.  Psychological:  Alert and cooperative. Normal mood and affect.  ASSESSMENT AND PLAN:  98) 71 year old male with GERD and dysphagia -GERD diet discussed -Patient instructed to cut food into small pieces and to chew food thoroughly.  Avoid large pieces of bread and meat. -EGD with possible esophageal dilatation benefits and risks discussed including risk with sedation, risk of bleeding, perforation and infection  -Omeprazole 20 mg 1 p.o. daily  2) History of hyperplastic colon/rectal polyps per colonoscopy 11/2013 -Next  colonoscopy due 11/2023         CC:  Esperanza Richters, PA-C

## 2022-08-07 ENCOUNTER — Encounter: Payer: Self-pay | Admitting: Certified Registered Nurse Anesthetist

## 2022-08-11 NOTE — Progress Notes (Signed)
History and Physical Interval Note:  08/12/2022 3:24 PM  Leroy Proctor  has presented today for endoscopic procedure(s), with the diagnosis of  Encounter Diagnosis  Name Primary?   Dysphagia, unspecified type Yes  .  The various methods of evaluation and treatment have been discussed with the patient and/or family. After consideration of risks, benefits and other options for treatment, the patient has consented to  the endoscopic procedure(s).   The patient's history has been reviewed, patient examined, no change in status, stable for endoscopic procedure(s).  I have reviewed the patient's chart and labs.  Questions were answered to the patient's satisfaction.    HISTORY OF PRESENT ILLNESS: Leroy Proctor is a 71 year old male with a past medical history of hypertension, hyperlipidemia, hearing impaired, renal insufficiency, kidney stones and colon polyps. He presents to our office today as referred by Mackie Pai PA-C for further evaluation regarding dysphagia.  Approximately 3 months ago, he took his wife for a checkup and while he waited, he ate chips and salsa which became stuck in his mid esophagus.  He attempted to drink water but the water came right back out.  The stuck chips and salsa past 10 minutes later.  Since then, he has experienced increased heartburn and burping which has worsened over the past 3 weeks.  No further episodes of dysphagia but he describes having vague episodes when he feels as if food might get stuck but it does not happen.  He recalled a few years ago he was eating food with garlic which became briefly stuck in the esophagus but passed within a few seconds.  He took an OTC acid reducer for a few days without significant improvement.  He is taking Pepto-Bismol 1 tab daily which has reduced his heartburn.  No recent antibiotic or steroid use.  He denies having any upper or lower abdominal pain.  He is passing normal formed brown bowel movement daily.  No rectal bleeding  or black stools.       Latest Ref Rng & Units 10/14/2021    8:29 AM 11/17/2020    8:42 AM 11/14/2019    9:17 AM  CBC  WBC 4.0 - 10.5 K/uL 6.3  4.9  6.3   Hemoglobin 13.0 - 17.0 g/dL 14.8  15.4  14.7   Hematocrit 39.0 - 52.0 % 44.1  45.8  44.8   Platelets 150.0 - 400.0 K/uL 229.0  242.0  281.0           Latest Ref Rng & Units 04/19/2022    8:30 AM 10/14/2021    8:29 AM 11/17/2020    8:42 AM  CMP  Glucose 70 - 99 mg/dL 103  102  100   BUN 6 - 23 mg/dL '20  24  19   '$ Creatinine 0.40 - 1.50 mg/dL 0.96  1.09  1.12   Sodium 135 - 145 mEq/L 139  138  137   Potassium 3.5 - 5.1 mEq/L 4.1  4.2  4.2   Chloride 96 - 112 mEq/L 103  102  100   CO2 19 - 32 mEq/L '28  29  28   '$ Calcium 8.4 - 10.5 mg/dL 9.3  9.0  9.3   Total Protein 6.0 - 8.3 g/dL 7.2  7.0  7.5   Total Bilirubin 0.2 - 1.2 mg/dL 0.7  1.0  1.1   Alkaline Phos 39 - 117 U/L 55  53  55   AST 0 - 37 U/L 15  18  21  ALT 0 - 53 U/L '20  23  22     '$ Colonoscopy 11/21/2013: -2 hyperplastic polyps removed from the sigmoid and rectum -Sigmoid diverticulosis -10 year colonoscopy recall    Gatha Mayer, MD, [facility]

## 2022-08-12 ENCOUNTER — Encounter: Payer: Self-pay | Admitting: Internal Medicine

## 2022-08-12 ENCOUNTER — Ambulatory Visit (AMBULATORY_SURGERY_CENTER): Payer: Medicare PPO | Admitting: Internal Medicine

## 2022-08-12 VITALS — BP 105/66 | HR 52 | Temp 97.8°F | Resp 10 | Ht 68.0 in | Wt 194.0 lb

## 2022-08-12 DIAGNOSIS — R1310 Dysphagia, unspecified: Secondary | ICD-10-CM

## 2022-08-12 DIAGNOSIS — K449 Diaphragmatic hernia without obstruction or gangrene: Secondary | ICD-10-CM | POA: Diagnosis not present

## 2022-08-12 DIAGNOSIS — K219 Gastro-esophageal reflux disease without esophagitis: Secondary | ICD-10-CM

## 2022-08-12 DIAGNOSIS — K222 Esophageal obstruction: Secondary | ICD-10-CM

## 2022-08-12 DIAGNOSIS — N182 Chronic kidney disease, stage 2 (mild): Secondary | ICD-10-CM | POA: Diagnosis not present

## 2022-08-12 DIAGNOSIS — I10 Essential (primary) hypertension: Secondary | ICD-10-CM | POA: Diagnosis not present

## 2022-08-12 MED ORDER — SODIUM CHLORIDE 0.9 % IV SOLN: 500.0000 mL | Freq: Once | INTRAVENOUS | Status: DC

## 2022-08-12 NOTE — Progress Notes (Signed)
Pt's states no medical or surgical changes since previsit or office visit. 

## 2022-08-12 NOTE — Patient Instructions (Addendum)
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 Cause of swallowing difficulty. Dilated to open it up.  2) Hiatal hernia - small. This is the stomach moving up into chest cavity (from abdominal cavity). Very common but is often associated with acid reflux trouble.  I hope this solves your problem - I do think taking omeprazole every day like you are will reduce the chance of needing another endoscopy and dilation so please stay on that and follow a GERD diet.  I appreciate the opportunity to care for you. Gatha Mayer, MD, FACG  YOU HAD AN ENDOSCOPIC PROCEDURE TODAY AT [facility]:   Refer to the procedure report that was given to you for any specific questions about what was found during the examination.  If the procedure report does not answer your questions, please call your gastroenterologist to clarify.  If you requested that your care partner not be given the details of your procedure findings, then the procedure report has been included in a sealed envelope for you to review at your convenience later.  YOU SHOULD EXPECT: Some feelings of bloating in the abdomen. Passage of more gas than usual.  Walking can help get rid of the air that was put into your GI tract during the procedure and reduce the bloating. If you had a lower endoscopy (such as a colonoscopy or flexible sigmoidoscopy) you may notice spotting of blood in your stool or on the toilet paper. If you underwent a bowel prep for your procedure, you may not have a normal bowel movement for a few days.  Please Note:  You might notice some irritation and congestion in your nose or some drainage.  This is from the oxygen used during your procedure.  There is no need for concern and it should clear up in a day or so.  SYMPTOMS TO REPORT IMMEDIATELY:  Following lower endoscopy (colonoscopy or flexible sigmoidoscopy):  Excessive amounts of blood  in the stool  Significant tenderness or worsening of abdominal pains  Swelling of the abdomen that is new, acute  Fever of 100F or higher  Following upper endoscopy (EGD)  Vomiting of blood or coffee ground material  New chest pain or pain under the shoulder blades  Painful or persistently difficult swallowing  New shortness of breath  Fever of 100F or higher  Black, tarry-looking stools  For urgent or emergent issues, a gastroenterologist can be reached at any hour by calling [phone]. Do not use MyChart messaging for urgent concerns.    DIET:  We do recommend a small meal at first, but then you may proceed to your regular diet.  Drink plenty of fluids but you should avoid alcoholic beverages for 24 hours.  ACTIVITY:  You should plan to take it easy for the rest of today and you should NOT DRIVE or use heavy machinery until tomorrow (because of the sedation medicines used during the test).    FOLLOW UP: Our staff will call the number listed on your records the next business day following your procedure.  We will call around 7:15- 8:00 am to check on you and address any questions or concerns that you may have regarding the information given to you following your procedure. If we do not reach you, we will leave a message.     If any biopsies were taken you will be contacted by phone or by letter within the next 1-3  weeks.  Please call us at [phone] if you have not heard about the biopsies in 3 weeks.    SIGNATURES/CONFIDENTIALITY: You and/or your care partner have signed paperwork which will be entered into your electronic medical record.  These signatures attest to the fact that that the information above on your After Visit Summary has been reviewed and is understood.  Full responsibility of the confidentiality of this discharge information lies with you and/or your care-partner.

## 2022-08-12 NOTE — Progress Notes (Signed)
1526 Robinul 0.1 mg IV given due large amount of secretions upon assessment.  MD made aware, vss 

## 2022-08-12 NOTE — Op Note (Signed)
[facility] Patient Name: Leroy Proctor Procedure Date: 08/12/2022 3:14 PM MRN: [id number] Endoscopist: Iva Boop , MD Age: 71 Referring MD:  Date of Birth: Jul 09, 1951 Gender: Male Account #: [id number] Procedure:                Upper GI endoscopy Indications:              Dysphagia Medicines:                Monitored Anesthesia Care Procedure:                Pre-Anesthesia Assessment:                           - Prior to the procedure, a History and Physical                            was performed, and patient medications and                            allergies were reviewed. The patient's tolerance of                            previous anesthesia was also reviewed. The risks                            and benefits of the procedure and the sedation                            options and risks were discussed with the patient.                            All questions were answered, and informed consent                            was obtained. Prior Anticoagulants: The patient has                            taken no previous anticoagulant or antiplatelet                            agents. ASA Grade Assessment: II - A patient with                            mild systemic disease. After reviewing the risks                            and benefits, the patient was deemed in                            satisfactory condition to undergo the procedure.                           After obtaining informed consent, the endoscope was  passed under direct vision. Throughout the                            procedure, the patient's blood pressure, pulse, and                            oxygen saturations were monitored continuously. The                            Endoscope was introduced through the mouth, and                            advanced to the second part of duodenum. The upper                            GI endoscopy was accomplished without difficulty.                             The patient tolerated the procedure well. Scope In: Scope Out: Findings:                 One benign-appearing, intrinsic moderate                            (circumferential scarring or stenosis; an endoscope                            may pass) stenosis was found at the                            gastroesophageal junction. This stenosis measured                            1.5 cm (inner diameter). The stenosis was                            traversed. A TTS dilator was passed through the                            scope. Dilation with an 18-19-20 mm balloon dilator                            was performed to 18 mm. The dilation site was                            examined and showed mild mucosal disruption.                            Estimated blood loss was minimal.                           A 3 cm hiatal hernia was present.                           The gastroesophageal  flap valve was visualized                            endoscopically and classified as Hill Grade IV (no                            fold, wide open lumen, hiatal hernia present).                           The exam was otherwise without abnormality.                           The cardia and gastric fundus were normal on                            retroflexion. Complications:            No immediate complications. Estimated Blood Loss:     Estimated blood loss was minimal. Impression:               - Benign-appearing esophageal stenosis. Dilated.                           - 3 cm hiatal hernia.                           - Gastroesophageal flap valve classified as Hill                            Grade IV (no fold, wide open lumen, hiatal hernia                            present).                           - The examination was otherwise normal.                           - No specimens collected. Recommendation:           - Patient has a contact number available for                             emergencies. The signs and symptoms of potential                            delayed complications were discussed with the                            patient. Return to normal activities tomorrow.                            Written discharge instructions were provided to the                            patient.                           -  Clear liquids x 1 hour then soft foods rest of                            day. Start prior diet tomorrow.                           - Continue present medications.                           - Stay on PPI (omeprazole) to reduce risk of                            recurrent stricture and need for dilation again                           GERD diet Iva Boop, MD 08/12/2022 3:58:27 PM This report has been signed electronically.

## 2022-08-12 NOTE — Progress Notes (Signed)
Called to room to assist during endoscopic procedure.  Patient ID and intended procedure confirmed with present staff. Received instructions for my participation in the procedure from the performing physician.  

## 2022-08-15 ENCOUNTER — Telehealth: Payer: Self-pay

## 2022-08-15 NOTE — Telephone Encounter (Signed)
Attempted to reach patient for post-procedure f/u call. No answer. Left message for him to please not hesitate to call us if he has any questions/concerns regarding his care. 

## 2022-08-23 ENCOUNTER — Other Ambulatory Visit: Payer: Self-pay | Admitting: Medical

## 2022-08-31 ENCOUNTER — Other Ambulatory Visit: Payer: Self-pay | Admitting: Medical

## 2022-08-31 DIAGNOSIS — I10 Essential (primary) hypertension: Secondary | ICD-10-CM

## 2022-09-22 ENCOUNTER — Other Ambulatory Visit: Payer: Self-pay | Admitting: Nurse Practitioner

## 2022-09-23 ENCOUNTER — Other Ambulatory Visit (HOSPITAL_BASED_OUTPATIENT_CLINIC_OR_DEPARTMENT_OTHER): Payer: Self-pay

## 2022-09-23 MED ORDER — COMIRNATY 30 MCG/0.3ML IM SUSY
PREFILLED_SYRINGE | INTRAMUSCULAR | 0 refills | Status: DC
Start: 2022-09-23 — End: 2023-05-11
  Filled 2022-09-23: qty 0.3, 1d supply, fill #0

## 2022-11-01 ENCOUNTER — Other Ambulatory Visit: Payer: Self-pay

## 2022-11-01 ENCOUNTER — Encounter: Payer: Self-pay | Admitting: Medical

## 2022-11-01 DIAGNOSIS — I10 Essential (primary) hypertension: Secondary | ICD-10-CM

## 2022-11-01 MED ORDER — OMEPRAZOLE 20 MG PO CPDR: DELAYED_RELEASE_CAPSULE | ORAL | 1 refills | Status: DC

## 2022-11-01 MED ORDER — CHLORTHALIDONE 25 MG PO TABS: 25.0000 mg | ORAL_TABLET | Freq: Every day | ORAL | 0 refills | Status: DC

## 2022-11-01 MED ORDER — AMLODIPINE BESYLATE 10 MG PO TABS: 10.0000 mg | ORAL_TABLET | Freq: Every day | ORAL | 1 refills | Status: DC

## 2022-11-01 MED ORDER — ATORVASTATIN CALCIUM 20 MG PO TABS: 20.0000 mg | ORAL_TABLET | Freq: Every day | ORAL | 0 refills | Status: DC

## 2022-11-01 MED ORDER — TADALAFIL 20 MG PO TABS: ORAL_TABLET | ORAL | 1 refills | Status: DC

## 2022-11-09 ENCOUNTER — Encounter: Payer: Self-pay | Admitting: Medical

## 2022-11-09 ENCOUNTER — Telehealth: Payer: Self-pay | Admitting: *Deleted

## 2022-11-09 ENCOUNTER — Other Ambulatory Visit (HOSPITAL_BASED_OUTPATIENT_CLINIC_OR_DEPARTMENT_OTHER): Payer: Self-pay

## 2022-11-09 ENCOUNTER — Encounter: Payer: Medicare PPO | Admitting: Medical

## 2022-11-09 MED ORDER — FLUAD QUADRIVALENT 0.5 ML IM PRSY
PREFILLED_SYRINGE | INTRAMUSCULAR | 0 refills | Status: DC
  Filled 2022-11-09: qty 0.5, 1d supply, fill #0

## 2022-11-09 NOTE — Telephone Encounter (Signed)
Patient checked in around 1043am for his 11am appointment.  Leroy Proctor was running behind.  He was notified that Leroy Proctor was running behind and was being updated and offered water by Janett Billow.  Janett Billow stated that patient had left and I advised her to see if he maybe want to reschedule, maybe do an 8am appointment or 1pm appointment and patient was yelling to Janett Billow he just wanted his $20 back. I went to try and see if he wanted again reschedule he yelled again that he could not hear me.  So I went closer, he stated he did not have his hearing aid in.  I ask again could we get him reschedule again and he stated "why I'm already here."  So I backed down and he was refunded his $20 and left out.

## 2023-01-14 ENCOUNTER — Other Ambulatory Visit: Payer: Self-pay | Admitting: Medical

## 2023-01-23 ENCOUNTER — Other Ambulatory Visit (HOSPITAL_BASED_OUTPATIENT_CLINIC_OR_DEPARTMENT_OTHER): Payer: Self-pay

## 2023-01-23 MED ORDER — PREVNAR 20 0.5 ML IM SUSY
PREFILLED_SYRINGE | INTRAMUSCULAR | 0 refills | Status: DC
  Filled 2023-01-23: qty 0.5, 1d supply, fill #0

## 2023-02-13 DIAGNOSIS — M7662 Achilles tendinitis, left leg: Secondary | ICD-10-CM | POA: Diagnosis not present

## 2023-02-13 DIAGNOSIS — M7732 Calcaneal spur, left foot: Secondary | ICD-10-CM | POA: Diagnosis not present

## 2023-02-13 DIAGNOSIS — M722 Plantar fascial fibromatosis: Secondary | ICD-10-CM | POA: Diagnosis not present

## 2023-02-14 ENCOUNTER — Other Ambulatory Visit: Payer: Self-pay | Admitting: Medical

## 2023-02-25 ENCOUNTER — Other Ambulatory Visit: Payer: Self-pay | Admitting: Medical

## 2023-02-25 DIAGNOSIS — I10 Essential (primary) hypertension: Secondary | ICD-10-CM

## 2023-03-15 ENCOUNTER — Other Ambulatory Visit: Payer: Self-pay | Admitting: Medical

## 2023-03-15 ENCOUNTER — Encounter: Payer: Self-pay | Admitting: *Deleted

## 2023-03-30 ENCOUNTER — Encounter: Payer: Self-pay | Admitting: *Deleted

## 2023-04-15 ENCOUNTER — Other Ambulatory Visit: Payer: Self-pay | Admitting: Medical

## 2023-04-27 ENCOUNTER — Telehealth: Payer: Self-pay | Admitting: Medical

## 2023-04-27 NOTE — Telephone Encounter (Signed)
Copied from CRM [id number]. Topic: Medicare AWV >> Apr 27, 2023 10:02 AM Payton Doughty wrote: Reason for CRM: Called patient to schedule Medicare Annual Wellness Visit (AWV). Left message for patient to call back and schedule Medicare Annual Wellness Visit (AWV).  Last date of AWV: 04/18/22  Please schedule an appointment at any time with Donne Anon, CMA  .  If any questions, please contact me.  Thank you ,  Verlee Rossetti; Care Guide Ambulatory Clinical Support [place] [facility] Direct Dial: [phone]

## 2023-05-11 ENCOUNTER — Ambulatory Visit (INDEPENDENT_AMBULATORY_CARE_PROVIDER_SITE_OTHER): Payer: Medicare PPO | Admitting: *Deleted

## 2023-05-11 DIAGNOSIS — Z0000 Encounter for general adult medical examination without abnormal findings: Secondary | ICD-10-CM

## 2023-05-11 NOTE — Patient Instructions (Signed)
Mr. Leroy Proctor , Thank you for taking time to come for your Medicare Wellness Visit. I appreciate your ongoing commitment to your health goals. Please review the following plan we discussed and let me know if I can assist you in the future.   These are the goals we discussed:  Goals   None     This is a list of the screening recommended for you and due dates:  Health Maintenance  Topic Date Due   Flu Shot  07/06/2023   Colon Cancer Screening  11/22/2023   Medicare Annual Wellness Visit  05/10/2024   DTaP/Tdap/Td vaccine (3 - Td or Tdap) 08/20/2028   Pneumonia Vaccine  Completed   COVID-19 Vaccine  Completed   Hepatitis C Screening  Completed   Zoster (Shingles) Vaccine  Completed   HPV Vaccine  Aged Out     Next appointment: Follow up in one year for your annual wellness visit.   Preventive Care 72 Years and Older, Male Preventive care refers to lifestyle choices and visits with your health care provider that can promote health and wellness. What does preventive care include? A yearly physical exam. This is also called an annual well check. Dental exams once or twice a year. Routine eye exams. Ask your health care provider how often you should have your eyes checked. Personal lifestyle choices, including: Daily care of your teeth and gums. Regular physical activity. Eating a healthy diet. Avoiding tobacco and drug use. Limiting alcohol use. Practicing safe sex. Taking low doses of aspirin every day. Taking vitamin and mineral supplements as recommended by your health care provider. What happens during an annual well check? The services and screenings done by your health care provider during your annual well check will depend on your age, overall health, lifestyle risk factors, and family history of disease. Counseling  Your health care provider may ask you questions about your: Alcohol use. Tobacco use. Drug use. Emotional well-being. Home and relationship  well-being. Sexual activity. Eating habits. History of falls. Memory and ability to understand (cognition). Work and work Astronomer. Screening  You may have the following tests or measurements: Height, weight, and BMI. Blood pressure. Lipid and cholesterol levels. These may be checked every 5 years, or more frequently if you are over 70 years old. Skin check. Lung cancer screening. You may have this screening every year starting at age 27 if you have a 30-pack-year history of smoking and currently smoke or have quit within the past 15 years. Fecal occult blood test (FOBT) of the stool. You may have this test every year starting at age 72. Flexible sigmoidoscopy or colonoscopy. You may have a sigmoidoscopy every 5 years or a colonoscopy every 10 years starting at age 72. Prostate cancer screening. Recommendations will vary depending on your family history and other risks. Hepatitis C blood test. Hepatitis B blood test. Sexually transmitted disease (STD) testing. Diabetes screening. This is done by checking your blood sugar (glucose) after you have not eaten for a while (fasting). You may have this done every 1-3 years. Abdominal aortic aneurysm (AAA) screening. You may need this if you are a current or former smoker. Osteoporosis. You may be screened starting at age 72 if you are at high risk. Talk with your health care provider about your test results, treatment options, and if necessary, the need for more tests. Vaccines  Your health care provider may recommend certain vaccines, such as: Influenza vaccine. This is recommended every year. Tetanus, diphtheria, and acellular pertussis (Tdap,  Td) vaccine. You may need a Td booster every 10 years. Zoster vaccine. You may need this after age 72. Pneumococcal 13-valent conjugate (PCV13) vaccine. One dose is recommended after age 72. Pneumococcal polysaccharide (PPSV23) vaccine. One dose is recommended after age 72. Talk to your health care  provider about which screenings and vaccines you need and how often you need them. This information is not intended to replace advice given to you by your health care provider. Make sure you discuss any questions you have with your health care provider. Document Released: 12/18/2015 Document Revised: 08/10/2016 Document Reviewed: 09/22/2015 Elsevier Interactive Patient Education  2017 [employer].  Fall Prevention in the Home Falls can cause injuries. They can happen to people of all ages. There are many things you can do to make your home safe and to help prevent falls. What can I do on the outside of my home? Regularly fix the edges of walkways and driveways and fix any cracks. Remove anything that might make you trip as you walk through a door, such as a raised step or threshold. Trim any bushes or trees on the path to your home. Use bright outdoor lighting. Clear any walking paths of anything that might make someone trip, such as rocks or tools. Regularly check to see if handrails are loose or broken. Make sure that both sides of any steps have handrails. Any raised decks and porches should have guardrails on the edges. Have any leaves, snow, or ice cleared regularly. Use sand or salt on walking paths during winter. Clean up any spills in your garage right away. This includes oil or grease spills. What can I do in the bathroom? Use night lights. Install grab bars by the toilet and in the tub and shower. Do not use towel bars as grab bars. Use non-skid mats or decals in the tub or shower. If you need to sit down in the shower, use a plastic, non-slip stool. Keep the floor dry. Clean up any water that spills on the floor as soon as it happens. Remove soap buildup in the tub or shower regularly. Attach bath mats securely with double-sided non-slip rug tape. Do not have throw rugs and other things on the floor that can make you trip. What can I do in the bedroom? Use night lights. Make  sure that you have a light by your bed that is easy to reach. Do not use any sheets or blankets that are too big for your bed. They should not hang down onto the floor. Have a firm chair that has side arms. You can use this for support while you get dressed. Do not have throw rugs and other things on the floor that can make you trip. What can I do in the kitchen? Clean up any spills right away. Avoid walking on wet floors. Keep items that you use a lot in easy-to-reach places. If you need to reach something above you, use a strong step stool that has a grab bar. Keep electrical cords out of the way. Do not use floor polish or wax that makes floors slippery. If you must use wax, use non-skid floor wax. Do not have throw rugs and other things on the floor that can make you trip. What can I do with my stairs? Do not leave any items on the stairs. Make sure that there are handrails on both sides of the stairs and use them. Fix handrails that are broken or loose. Make sure that handrails are as  long as the stairways. Check any carpeting to make sure that it is firmly attached to the stairs. Fix any carpet that is loose or worn. Avoid having throw rugs at the top or bottom of the stairs. If you do have throw rugs, attach them to the floor with carpet tape. Make sure that you have a light switch at the top of the stairs and the bottom of the stairs. If you do not have them, ask someone to add them for you. What else can I do to help prevent falls? Wear shoes that: Do not have high heels. Have rubber bottoms. Are comfortable and fit you well. Are closed at the toe. Do not wear sandals. If you use a stepladder: Make sure that it is fully opened. Do not climb a closed stepladder. Make sure that both sides of the stepladder are locked into place. Ask someone to hold it for you, if possible. Clearly mark and make sure that you can see: Any grab bars or handrails. First and last steps. Where the  edge of each step is. Use tools that help you move around (mobility aids) if they are needed. These include: Canes. Walkers. Scooters. Crutches. Turn on the lights when you go into a dark area. Replace any light bulbs as soon as they burn out. Set up your furniture so you have a clear path. Avoid moving your furniture around. If any of your floors are uneven, fix them. If there are any pets around you, be aware of where they are. Review your medicines with your doctor. Some medicines can make you feel dizzy. This can increase your chance of falling. Ask your doctor what other things that you can do to help prevent falls. This information is not intended to replace advice given to you by your health care provider. Make sure you discuss any questions you have with your health care provider. Document Released: 09/17/2009 Document Revised: 04/28/2016 Document Reviewed: 12/26/2014 Elsevier Interactive Patient Education  2017 [employer].

## 2023-05-11 NOTE — Progress Notes (Signed)
Subjective:  Pt completed ADLs, Fall risk, and SDOH during e-check in on 05/08/23.  Answers verified with pt.    Leroy Proctor is a 72 y.o. male who presents for Medicare Annual/Subsequent preventive examination.  I connected with  Leroy Proctor on 05/11/23 by a audio enabled telemedicine application and verified that I am speaking with the correct person using two identifiers.  Patient Location: Home  Provider Location: Office/Clinic  I discussed the limitations of evaluation and management by telemedicine. The patient expressed understanding and agreed to proceed.   Review of Systems     Cardiac Risk Factors include: advanced age (>48men, >87 women);male gender;dyslipidemia;hypertension     Objective:    Today's Vitals   There is no height or weight on file to calculate BMI.     05/11/2023    3:39 PM 04/18/2022    1:02 PM  Advanced Directives  Does Patient Have a Medical Advance Directive? Yes Yes  Type of Estate agent of [place];Living will Healthcare Power of [place];Out of facility DNR (pink MOST or yellow form);Living will  Copy of Healthcare Power of Attorney in Chart? No - copy requested No - copy requested    Current Medications (verified) Outpatient Encounter Medications as of 05/11/2023  Medication Sig   amLODipine (NORVASC) 10 MG tablet Take 1 tablet (10 mg total) by mouth daily.   atorvastatin (LIPITOR) 20 MG tablet Take 1 tablet (20 mg total) by mouth daily.   chlorthalidone (HYGROTON) 25 MG tablet TAKE 1 TABLET BY MOUTH DAILY   omeprazole (PRILOSEC) 20 MG capsule TAKE 1 CAPSULE BY MOUTH EVERY MORNING 30 MINUTES BEFORE BREAKFAST   tadalafil (CIALIS) 20 MG tablet TAKE 1/2 TO 1 TABLET BY MOUTH EVERY OTHER DAY AS NEEDED FOR ERECTILE DYSFUNCTION   [DISCONTINUED] COVID-19 mRNA vaccine 2023-2024 (COMIRNATY) syringe Inject into the muscle.   [DISCONTINUED] influenza vaccine adjuvanted (FLUAD QUADRIVALENT) 0.5 ML injection Inject into the muscle.    [DISCONTINUED] pneumococcal 20-valent conjugate vaccine (PREVNAR 20) 0.5 ML injection Inject into the muscle.   No facility-administered encounter medications on file as of 05/11/2023.    Allergies (verified) Atenolol and Demerol   History: Past Medical History:  Diagnosis Date   Allergy    Arthritis    Cataract    Chronic renal insufficiency, stage II (mild)    CrCl 60s   Erectile dysfunction    GERD (gastroesophageal reflux disease)    Hearing impaired    Herpes zoster    R side of face   Hyperlipidemia    Hypertension    Impaired fasting glucose 2013-2015   Kidney stones    Personal history of kidney stones    Recurrent herpes labialis    Past Surgical History:  Procedure Laterality Date   COLONOSCOPY  2002; 2014   Repeat 2024   LITHOTRIPSY     Family History  Problem Relation Age of Onset   Hearing loss Mother    Diabetes Father    Colon cancer Neg Hx    Stomach cancer Neg Hx    Esophageal cancer Neg Hx    Social History   Socioeconomic History   Marital status: Married    Spouse name: Not on file   Number of children: 0   Years of education: Not on file   Highest education level: Not on file  Occupational History   Occupation: retired  Tobacco Use   Smoking status: Former    Packs/day: 0.50    Years: 15.00  Additional pack years: 0.00    Total pack years: 7.50    Types: Cigarettes   Smokeless tobacco: Never  Vaping Use   Vaping Use: Never used  Substance and Sexual Activity   Alcohol use: Yes    Alcohol/week: 13.0 standard drinks of alcohol    Types: 1 Glasses of wine, 12 Cans of beer per week    Comment: drink beer   Drug use: No   Sexual activity: Yes    Birth control/protection: None  Other Topics Concern   Not on file  Social History Narrative   Married, no children.   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   No T/A/Ds.   Exercise: stationary bike/nautilus 5 days a week.         Social Determinants of Health    Financial Resource Strain: Low Risk  (05/08/2023)   Overall Financial Resource Strain (CARDIA)    Difficulty of Paying Living Expenses: Not hard at all  Food Insecurity: No Food Insecurity (05/08/2023)   Hunger Vital Sign    Worried About Running Out of Food in the Last Year: Never true    Ran Out of Food in the Last Year: Never true  Transportation Needs: No Transportation Needs (05/08/2023)   PRAPARE - Administrator, Civil Service (Medical): No    Lack of Transportation (Non-Medical): No  Physical Activity: Sufficiently Active (05/08/2023)   Exercise Vital Sign    Days of Exercise per Week: 5 days    Minutes of Exercise per Session: 90 min  Stress: No Stress Concern Present (05/08/2023)   [employer] of Occupational Health - Occupational Stress Questionnaire    Feeling of Stress : Not at all  Social Connections: Unknown (05/08/2023)   Social Connection and Isolation Panel [NHANES]    Frequency of Communication with Friends and Family: Twice a week    Frequency of Social Gatherings with Friends and Family: Once a week    Attends Religious Services: Not on Marketing executive or Organizations: No    Attends Engineer, structural: Patient declined    Marital Status: Married    Tobacco Counseling Counseling given: Not Answered   Clinical Intake:  Pre-visit preparation completed: Yes  Pain : No/denies pain  Nutritional Risks: None Diabetes: No  How often do you need to have someone help you when you read instructions, pamphlets, or other written materials from your doctor or pharmacy?: 3 - Sometimes  Activities of Daily Living    05/08/2023   10:41 AM  In your present state of health, do you have any difficulty performing the following activities:  Hearing? 1  Comment wears hearing aids sometimes  Vision? 0  Difficulty concentrating or making decisions? 0  Walking or climbing stairs? 1  Dressing or bathing? 0  Doing errands, shopping? 0   Preparing Food and eating ? N  Using the Toilet? N  In the past six months, have you accidently leaked urine? Y  Do you have problems with loss of bowel control? N  Managing your Medications? N  Managing your Finances? N  Housekeeping or managing your Housekeeping? N    Patient Care Team: Saguier, Kateri Mc as PCP - General (Internal Medicine)  Indicate any recent Medical Services you may have received from other than [facility] providers in the past year (date may be approximate).     Assessment:   This is a routine wellness examination for [employer].  Hearing/Vision screen No  results found.  Dietary issues and exercise activities discussed: Current Exercise Habits: Home exercise routine, Type of exercise: Other - see comments;strength training/weights (elliptical 40 min, swim 20 min), Time (Minutes): > 60 (90 min), Frequency (Times/Week): 5, Weekly Exercise (Minutes/Week): 0, Intensity: Moderate, Exercise limited by: None identified   Goals Addressed   None    Depression Screen    05/11/2023    3:41 PM 11/09/2022   11:15 AM 04/18/2022    1:03 PM 10/05/2021   10:57 AM 05/21/2019    8:30 AM 11/07/2018   10:05 AM 11/06/2017    9:10 AM  PHQ 2/9 Scores  PHQ - 2 Score 0 0 0 0 0 0 0    Fall Risk    05/08/2023   10:41 AM 11/09/2022   11:15 AM 04/18/2022    1:03 PM 10/05/2021   10:57 AM 05/21/2019    8:30 AM  Fall Risk   Falls in the past year? 0 0 0 0 0  Number falls in past yr: 0 0 0 0 0  Injury with Fall? 0 0 0 0 0  Risk for fall due to : No Fall Risks No Fall Risks No Fall Risks    Follow up Falls evaluation completed Falls evaluation completed Falls evaluation completed  Falls evaluation completed    FALL RISK PREVENTION PERTAINING TO THE HOME:  Any stairs in or around the home? Yes  If so, are there any without handrails? Yes  Home free of loose throw rugs in walkways, pet beds, electrical cords, etc? Yes  Adequate lighting in your home to reduce risk of falls? Yes    ASSISTIVE DEVICES UTILIZED TO PREVENT FALLS:  Life alert? No  Use of a cane, walker or w/c? No  Grab bars in the bathroom? Yes  Shower chair or bench in shower? Yes  Elevated toilet seat or a handicapped toilet? No   TIMED UP AND GO:  Was the test performed?  No, audio visit .   Cognitive Function:        05/11/2023    3:47 PM 04/18/2022    1:06 PM  6CIT Screen  What Year? 0 points 0 points  What month? 0 points 0 points  What time? 0 points 0 points  Count back from 20 0 points 0 points  Months in reverse 0 points 0 points  Repeat phrase 0 points 0 points  Total Score 0 points 0 points    Immunizations Immunization History  Administered Date(s) Administered   COVID-19, mRNA, vaccine(Comirnaty)12 years and older 09/23/2022   Fluad Quad(high Dose 65+) 11/17/2020, 09/10/2021, 11/09/2022   Influenza Split 12/16/2011   Influenza, High Dose Seasonal PF 09/14/2016, 11/06/2017, 08/20/2018, 08/31/2019   Influenza,inj,Quad PF,6+ Mos 10/02/2013, 09/19/2014, 11/03/2015   PFIZER Comirnaty(Gray Top)Covid-19 Tri-Sucrose Vaccine 03/16/2021   PFIZER(Purple Top)SARS-COV-2 Vaccination 01/08/2020, 01/28/2020, 08/29/2020   PNEUMOCOCCAL CONJUGATE-20 01/23/2023   Pfizer Covid-19 Vaccine Bivalent Booster 10yrs & up 03/31/2022   Pneumococcal Conjugate-13 11/03/2016   Pneumococcal Polysaccharide-23 11/06/2017, 08/31/2019   Tdap 07/21/2006, 08/20/2018   Zoster Recombinat (Shingrix) 07/22/2017, 10/16/2017, 10/20/2021    TDAP status: Up to date  Flu Vaccine status: Up to date  Pneumococcal vaccine status: Up to date  Covid-19 vaccine status: Completed vaccines  Qualifies for Shingles Vaccine? Yes   Zostavax completed No   Shingrix Completed?: Yes  Screening Tests Health Maintenance  Topic Date Due   Medicare Annual Wellness (AWV)  04/19/2023   INFLUENZA VACCINE  07/06/2023   Colonoscopy  11/22/2023  DTaP/Tdap/Td (3 - Td or Tdap) 08/20/2028   Pneumonia Vaccine 71+ Years old   Completed   COVID-19 Vaccine  Completed   Hepatitis C Screening  Completed   Zoster Vaccines- Shingrix  Completed   HPV VACCINES  Aged Out    Health Maintenance  Health Maintenance Due  Topic Date Due   Medicare Annual Wellness (AWV)  04/19/2023    Colorectal cancer screening: Type of screening: Colonoscopy. Completed 11/21/13. Repeat every 10 years  Lung Cancer Screening: (Low Dose CT Chest recommended if Age 63-80 years, 30 pack-year currently smoking OR have quit w/in 15years.) does not qualify.    Additional Screening:  Hepatitis C Screening: does qualify; Completed 11/06/17  Vision Screening: Recommended annual ophthalmology exams for early detection of glaucoma and other disorders of the eye. Is the patient up to date with their annual eye exam?  Yes  Who is the provider or what is the name of the office in which the patient attends annual eye exams? [facility] If pt is not established with a provider, would they like to be referred to a provider to establish care? No .   Dental Screening: Recommended annual dental exams for proper oral hygiene  Community Resource Referral / Chronic Care Management: CRR required this visit?  No   CCM required this visit?  No      Plan:     I have personally reviewed and noted the following in the patient's chart:   Medical and social history Use of alcohol, tobacco or illicit drugs  Current medications and supplements including opioid prescriptions. Patient is not currently taking opioid prescriptions. Functional ability and status Nutritional status Physical activity Advanced directives List of other physicians Hospitalizations, surgeries, and ER visits in previous 12 months Vitals Screenings to include cognitive, depression, and falls Referrals and appointments  In addition, I have reviewed and discussed with patient certain preventive protocols, quality metrics, and best practice recommendations. A written  personalized care plan for preventive services as well as general preventive health recommendations were provided to patient.   Due to this being a telephonic visit, the after visit summary with patients personalized plan was offered to patient via mail or my-chart.  Patient would like to access on my-chart.  Donne Anon, [place]   05/11/2023   Nurse Notes: None

## 2023-05-14 ENCOUNTER — Other Ambulatory Visit: Payer: Self-pay | Admitting: Medical

## 2023-05-22 ENCOUNTER — Other Ambulatory Visit: Payer: Self-pay | Admitting: Medical

## 2023-05-27 ENCOUNTER — Other Ambulatory Visit: Payer: Self-pay | Admitting: Medical

## 2023-05-27 DIAGNOSIS — I10 Essential (primary) hypertension: Secondary | ICD-10-CM

## 2023-06-12 ENCOUNTER — Other Ambulatory Visit: Payer: Self-pay | Admitting: Medical

## 2023-06-12 ENCOUNTER — Encounter: Payer: Self-pay | Admitting: *Deleted

## 2023-06-19 ENCOUNTER — Ambulatory Visit: Payer: Medicare PPO | Admitting: Medical

## 2023-06-19 VITALS — BP 134/74 | HR 66 | Temp 97.9°F | Resp 18 | Ht 68.0 in | Wt 194.0 lb

## 2023-06-19 DIAGNOSIS — I10 Essential (primary) hypertension: Secondary | ICD-10-CM

## 2023-06-19 DIAGNOSIS — M25552 Pain in left hip: Secondary | ICD-10-CM

## 2023-06-19 DIAGNOSIS — K219 Gastro-esophageal reflux disease without esophagitis: Secondary | ICD-10-CM

## 2023-06-19 DIAGNOSIS — E785 Hyperlipidemia, unspecified: Secondary | ICD-10-CM | POA: Diagnosis not present

## 2023-06-19 DIAGNOSIS — R739 Hyperglycemia, unspecified: Secondary | ICD-10-CM | POA: Diagnosis not present

## 2023-06-19 DIAGNOSIS — M25551 Pain in right hip: Secondary | ICD-10-CM | POA: Diagnosis not present

## 2023-06-19 MED ORDER — AMLODIPINE BESYLATE 10 MG PO TABS: 10.0000 mg | ORAL_TABLET | Freq: Every day | ORAL | 1 refills | Status: DC

## 2023-06-19 MED ORDER — AMLODIPINE BESYLATE 10 MG PO TABS: 10.0000 mg | ORAL_TABLET | Freq: Every day | ORAL | 3 refills | Status: DC

## 2023-06-19 MED ORDER — ATORVASTATIN CALCIUM 20 MG PO TABS: 20.0000 mg | ORAL_TABLET | Freq: Every day | ORAL | 3 refills | Status: DC

## 2023-06-19 MED ORDER — OMEPRAZOLE 20 MG PO CPDR: DELAYED_RELEASE_CAPSULE | ORAL | 3 refills | Status: DC

## 2023-06-19 NOTE — Patient Instructions (Addendum)
1. Essential hypertension Bp well controlled. Continue amlodipine 10 mg daily and chorthalidone 25 mg daily  2. Hyperlipidemia, unspecified hyperlipidemia type Check cmp and lipid panel(future labs when fasting) Continue atorvastatin  3. Gastroesophageal reflux disease, unspecified whether esophagitis present Controlled. Continue omeprazole.  4. Elevated blood sugar Low sugar diet and continue exercise. Check A1c.  5. Bilateral hip pain. On discussion told bursitis in past. Will refer to sports med MD Antoine Primas.    Follow up date to be determined after lab review.

## 2023-06-19 NOTE — Progress Notes (Signed)
Subjective:    Patient ID: Leroy Proctor, male    DOB: [birth year], 72 y.o.   MRN: [id number]  HPI  Pt in for follow up.  Htn- Amlodipine 10 mg daily and chorthalidone 25 mg daily.  When he checks his bp well controlled. Pt works out 5 days. Walks 1/2 mile in morning, elliptical, 20 minute of weight and swims 20 minutes.  High cholesterol- last check one year ago was controlled. On lipitor.  Genella Rife- well controlled on omeprazole.  Elevated sugar with A1c of 5.7.   Pt notes when he walks up hills will get hip pain. In past he was told had bursitis. In the past he mentions that he got x-rays of hips and told was bursitis. Pt states pain has been going for 5 years.     Review of Systems  Constitutional:  Negative for chills, fatigue and fever.  HENT:  Negative for congestion and ear discharge.   Respiratory:  Negative for cough, chest tightness, shortness of breath and wheezing.   Cardiovascular:  Negative for chest pain and palpitations.  Gastrointestinal:  Negative for abdominal pain, blood in stool and constipation.  Genitourinary:  Negative for dysuria, frequency and hematuria.  Musculoskeletal:  Negative for back pain, myalgias and neck stiffness.  Skin:  Negative for rash.  Neurological:  Negative for dizziness, speech difficulty, weakness and light-headedness.  Hematological:  Negative for adenopathy. Does not bruise/bleed easily.  Psychiatric/Behavioral:  Negative for behavioral problems, decreased concentration and dysphoric mood.     Past Medical History:  Diagnosis Date   Allergy    Arthritis    Cataract    Chronic renal insufficiency, stage II (mild)    CrCl 60s   Erectile dysfunction    GERD (gastroesophageal reflux disease)    Hearing impaired    Herpes zoster    R side of face   Hyperlipidemia    Hypertension    Impaired fasting glucose 2013-2015   Kidney stones    Personal history of kidney stones    Recurrent herpes labialis      Social History    Socioeconomic History   Marital status: Married    Spouse name: Not on file   Number of children: 0   Years of education: Not on file   Highest education level: Some college, no degree  Occupational History   Occupation: retired  Tobacco Use   Smoking status: Former    Current packs/day: 0.50    Average packs/day: 0.5 packs/day for 15.0 years (7.5 ttl pk-yrs)    Types: Cigarettes   Smokeless tobacco: Never  Vaping Use   Vaping status: Never Used  Substance and Sexual Activity   Alcohol use: Yes    Alcohol/week: 13.0 standard drinks of alcohol    Types: 1 Glasses of wine, 12 Cans of beer per week    Comment: drink beer   Drug use: No   Sexual activity: Yes    Birth control/protection: None  Other Topics Concern   Not on file  Social History Narrative   Married, no children.   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   No T/A/Ds.   Exercise: stationary bike/nautilus 5 days a week.         Social Determinants of Health   Financial Resource Strain: Low Risk  (05/08/2023)   Overall Financial Resource Strain (CARDIA)    Difficulty of Paying Living Expenses: Not hard at all  Food Insecurity: No Food Insecurity (06/14/2023)   Hunger Vital  Sign    Worried About Programme researcher, broadcasting/film/video in the Last Year: Never true    Ran Out of Food in the Last Year: Never true  Transportation Needs: No Transportation Needs (06/14/2023)   PRAPARE - Administrator, Civil Service (Medical): No    Lack of Transportation (Non-Medical): No  Physical Activity: Sufficiently Active (05/08/2023)   Exercise Vital Sign    Days of Exercise per Week: 5 days    Minutes of Exercise per Session: 90 min  Stress: No Stress Concern Present (06/14/2023)   [employer] of Occupational Health - Occupational Stress Questionnaire    Feeling of Stress : Not at all  Social Connections: Socially Isolated (06/14/2023)   Social Connection and Isolation Panel [NHANES]    Frequency of Communication with  Friends and Family: Once a week    Frequency of Social Gatherings with Friends and Family: Once a week    Attends Religious Services: Never    Database administrator or Organizations: No    Attends Banker Meetings: Patient declined    Marital Status: Married  Catering manager Violence: Not At Risk (04/18/2022)   Humiliation, Afraid, Rape, and Kick questionnaire    Fear of Current or Ex-Partner: No    Emotionally Abused: No    Physically Abused: No    Sexually Abused: No    Past Surgical History:  Procedure Laterality Date   COLONOSCOPY  2002; 2014   Repeat 2024   LITHOTRIPSY      Family History  Problem Relation Age of Onset   Hearing loss Mother    Diabetes Father    Colon cancer Neg Hx    Stomach cancer Neg Hx    Esophageal cancer Neg Hx     Allergies  Allergen Reactions   Atenolol     bradycarda- heart rate down to 38 bpm   Demerol Nausea And Vomiting    vomiting    Current Outpatient Medications on File Prior to Visit  Medication Sig Dispense Refill   amLODipine (NORVASC) 10 MG tablet Take 1 tablet (10 mg total) by mouth daily. 90 tablet 1   atorvastatin (LIPITOR) 20 MG tablet TAKE 1 TABLET BY MOUTH DAILY 90 tablet 0   chlorthalidone (HYGROTON) 25 MG tablet TAKE 1 TABLET BY MOUTH DAILY 90 tablet 0   omeprazole (PRILOSEC) 20 MG capsule TAKE 1 CAPSULE BY MOUTH EVERY MORNING 30 MINUTES BEFORE BREAKFAST 30 capsule 0   tadalafil (CIALIS) 20 MG tablet TAKE 1/2 TO 1 TABLET BY MOUTH EVERY OTHER DAY AS NEEDED FOR ERECTILE DYSFUNCTION 10 tablet 1   No current facility-administered medications on file prior to visit.    BP 134/74   Pulse 66   Temp 97.9 F (36.6 C)   Resp 18   Ht 5\' 8"  (1.727 m)   Wt 194 lb (88 kg)   SpO2 94%   BMI 29.50 kg/m        Objective:   Physical Exam  General Mental Status- Alert. General Appearance- Not in acute distress.   Skin General: Color- Normal Color. Moisture- Normal Moisture.  Neck Carotid Arteries-  Normal color. Moisture- Normal Moisture. No carotid bruits. No JVD.  Chest and Lung Exam Auscultation: Breath Sounds:-Normal.  Cardiovascular Auscultation:Rythm- Regular. Murmurs & Other Heart Sounds:Auscultation of the heart reveals- No Murmurs.  Abdomen Inspection:-Inspeection Normal. Palpation/Percussion:Note:No mass. Palpation and Percussion of the abdomen reveal- Non Tender, Non Distended + BS, no rebound or guarding.   Neurologic Cranial Nerve exam:-  CN III-XII intact(No nystagmus), symmetric smile. Strength:- 5/5 equal and symmetric strength both upper and lower extremities.       Assessment & Plan:   Patient Instructions  1. Essential hypertension Bp well controlled. Continue amlodipine 10 mg daily and chorthalidone 25 mg daily  2. Hyperlipidemia, unspecified hyperlipidemia type Check cmp and lipid panel(future labs when fasting) Continue atorvastatin  3. Gastroesophageal reflux disease, unspecified whether esophagitis present Controlled. Continue omeprazole.  4. Elevated blood sugar Low sugar diet and continue exercise. Check A1c.  5. Bilateral hip pain. On discussion told bursitis in past. Will refer to sports med MD Antoine Primas.  Follow up date to be determined after lab review.      Esperanza Richters, PA-C

## 2023-07-28 NOTE — Progress Notes (Signed)
This encounter was created in error - please disregard.

## 2023-08-22 ENCOUNTER — Other Ambulatory Visit: Payer: Self-pay | Admitting: Medical

## 2023-08-28 ENCOUNTER — Other Ambulatory Visit: Payer: Self-pay | Admitting: Medical

## 2023-08-28 DIAGNOSIS — I10 Essential (primary) hypertension: Secondary | ICD-10-CM

## 2023-08-29 ENCOUNTER — Other Ambulatory Visit (HOSPITAL_BASED_OUTPATIENT_CLINIC_OR_DEPARTMENT_OTHER): Payer: Self-pay

## 2023-08-29 ENCOUNTER — Other Ambulatory Visit: Payer: Self-pay | Admitting: Medical

## 2023-08-29 MED ORDER — COVID-19 MRNA VAC-TRIS(PFIZER) 30 MCG/0.3ML IM SUSY
0.3000 mL | PREFILLED_SYRINGE | Freq: Once | INTRAMUSCULAR | 0 refills | Status: AC
Start: 2023-08-29 — End: 2023-08-30
  Filled 2023-08-29: qty 0.3, 1d supply, fill #0

## 2023-08-29 MED ORDER — INFLUENZA VAC A&B SURF ANT ADJ 0.5 ML IM SUSY
0.5000 mL | PREFILLED_SYRINGE | Freq: Once | INTRAMUSCULAR | 0 refills | Status: AC
  Filled 2023-08-29: qty 0.5, 1d supply, fill #0

## 2023-09-04 ENCOUNTER — Ambulatory Visit: Payer: Medicare PPO | Admitting: Medical

## 2023-09-04 ENCOUNTER — Telehealth: Payer: Self-pay | Admitting: Medical

## 2023-09-04 ENCOUNTER — Encounter: Payer: Self-pay | Admitting: Medical

## 2023-09-04 VITALS — BP 148/80 | HR 85 | Temp 98.9°F | Resp 18 | Ht 68.0 in | Wt 195.0 lb

## 2023-09-04 DIAGNOSIS — R42 Dizziness and giddiness: Secondary | ICD-10-CM | POA: Diagnosis not present

## 2023-09-04 DIAGNOSIS — R9431 Abnormal electrocardiogram [ECG] [EKG]: Secondary | ICD-10-CM

## 2023-09-04 DIAGNOSIS — R0789 Other chest pain: Secondary | ICD-10-CM | POA: Diagnosis not present

## 2023-09-04 DIAGNOSIS — R001 Bradycardia, unspecified: Secondary | ICD-10-CM

## 2023-09-04 DIAGNOSIS — I10 Essential (primary) hypertension: Secondary | ICD-10-CM

## 2023-09-04 LAB — CBC WITH DIFFERENTIAL/PLATELET
Absolute Monocytes: 551 {cells}/uL (ref 200–950)
Basophils Absolute: 42 {cells}/uL (ref 0–200)
Basophils Relative: 0.8 %
Eosinophils Absolute: 360 {cells}/uL (ref 15–500)
Eosinophils Relative: 6.8 %
HCT: 40.3 % (ref 38.5–50.0)
Hemoglobin: 13.3 g/dL (ref 13.2–17.1)
Lymphs Abs: 943 {cells}/uL (ref 850–3900)
MCH: 29.4 pg (ref 27.0–33.0)
MCHC: 33 g/dL (ref 32.0–36.0)
MCV: 89.2 fL (ref 80.0–100.0)
MPV: 11 fL (ref 7.5–12.5)
Monocytes Relative: 10.4 %
Neutro Abs: 3403 {cells}/uL (ref 1500–7800)
Neutrophils Relative %: 64.2 %
Platelets: 281 10*3/uL (ref 140–400)
RBC: 4.52 10*6/uL (ref 4.20–5.80)
RDW: 12.5 % (ref 11.0–15.0)
Total Lymphocyte: 17.8 %
WBC: 5.3 10*3/uL (ref 3.8–10.8)

## 2023-09-04 LAB — COMPREHENSIVE METABOLIC PANEL
AG Ratio: 1.2 (calc) (ref 1.0–2.5)
ALT: 14 U/L (ref 9–46)
AST: 15 U/L (ref 10–35)
Albumin: 3.8 g/dL (ref 3.6–5.1)
Alkaline phosphatase (APISO): 71 U/L (ref 35–144)
BUN: 21 mg/dL (ref 7–25)
CO2: 28 mmol/L (ref 20–32)
Calcium: 8.8 mg/dL (ref 8.6–10.3)
Chloride: 103 mmol/L (ref 98–110)
Creat: 0.76 mg/dL (ref 0.70–1.28)
Globulin: 3.2 g/dL (ref 1.9–3.7)
Glucose, Bld: 91 mg/dL (ref 65–99)
Potassium: 3.8 mmol/L (ref 3.5–5.3)
Sodium: 139 mmol/L (ref 135–146)
Total Bilirubin: 0.6 mg/dL (ref 0.2–1.2)
Total Protein: 7 g/dL (ref 6.1–8.1)

## 2023-09-04 NOTE — Telephone Encounter (Signed)
PCP advised me to call patient to inquire about sxs. VM was left advising patient to go to the ER if sxs are still the same or to give [place] a call back.

## 2023-09-04 NOTE — Telephone Encounter (Signed)
Initial Comment Caller states his BP 90/77. Caller states he has blurred vision, dizziness and confusion. Translation No Nurse Assessment Nurse: Para March, RN, Jasmine Date/Time (Eastern Time): 09/04/2023 12:45:36 PM Confirm and document reason for call. If symptomatic, describe symptoms. ---161/85 was BP 5 minutes ago, and states Saturday BP was 90/77 and states that dizziness has been on and off for a week and no disorientation now. Does the patient have any new or worsening symptoms? ---Yes Will a triage be completed? ---Yes Related visit to physician within the last 2 weeks? ---No Does the PT have any chronic conditions? (i.e. diabetes, asthma, this includes High risk factors for pregnancy, etc.) ---Yes List chronic conditions. ---BP, Cholesterol Is this a behavioral health or substance abuse call? ---No Guidelines Guideline Title Affirmed Question Affirmed Notes Nurse Date/Time (Eastern Time) Blood Pressure - Low [1] Fall in systolic BP > 20 mm Hg from normal AND [2] NOT feeling weak or lightheaded Para March, RN, [facility] 09/04/2023 12:48:51 PM Disp. Time Lamount Cohen Time) Disposition Final User 09/04/2023 12:43:55 PM Send to Urgent Lennette Bihari, [place] PLEASE NOTE: All timestamps contained within this report are represented as [place] Standard Time. CONFIDENTIALTY NOTICE: This fax transmission is intended only for the addressee. It contains information that is legally privileged, confidential or otherwise protected from use or disclosure. If you are not the intended recipient, you are strictly prohibited from reviewing, disclosing, copying using or disseminating any of this information or taking any action in reliance on or regarding this information. If you have received this fax in error, please notify us immediately by telephone so that we can arrange for its return to [place]. Phone: [phone], Toll-Free: [phone], Fax: [phone] Page: 2 of 2 Call Id: [id number] Disp. Time  Lamount Cohen Time) Disposition Final User 09/04/2023 12:51:15 PM SEE PCP WITHIN 3 DAYS Yes Para March, RN, Leavy Cella Final Disposition 09/04/2023 12:51:15 PM SEE PCP WITHIN 3 DAYS Yes Para March, RN, Leavy Cella Caller Disagree/Comply Comply Caller Understands Yes PreDisposition Call Doctor Care Advice Given Per Guideline SEE PCP WITHIN 3 DAYS: * You need to be seen within 2 or 3 days. * PCP VISIT: Call your doctor (or NP/PA) during regular office hours and make an appointment. A clinic or urgent care center are good places to go for care if your doctor's office is closed or you can't get an appointment. NOTE: If office will be open tomorrow, tell caller to call then, not in 3 days. CALL BACK IF: * Lightheadedness, weakness, or dizziness occurs * Systolic BP under 90 * You feel sick * You become worse Referrals REFERRED TO PCP OFFICE

## 2023-09-04 NOTE — Progress Notes (Signed)
Subjective:    Patient ID: Leroy Proctor, male    DOB: 07-03-1951, 72 y.o.   MRN: [id number]  HPI   Discussed the use of AI scribe software for clinical note transcription with the patient, who gave verbal consent to proceed.  History of Present Illness   The patient, with a history of hypertension, reported experiencing blurred vision, dizziness, and confusion on the 30th, when their blood pressure was noted to be 90/77. They attributed these symptoms to high stress levels due to their wife's ill health. The patient's wife had a fall resulting in a concussion in May and has been struggling with an eating disorder and foot pain. The patient also reported difficulty focusing on small print, particularly when trying to read the wattage on a charger. other readings at [facility] have been lower end such as above but most not as low. 122/65, BP- 108/61, BP- 108/59  The patient has been monitoring their blood pressure at the [facility], with readings taken on Monday, Wednesday, and Friday. The reading of 90/77 was concerning to them. They also took their blood pressure on Saturday morning, which was 96/77. The patient reported no current symptoms of dizziness, lightheadedness, or blurred vision. They have been maintaining adequate hydration and nutrition, with a diet consisting of fish, chicken, and salads.  The patient has been on amlodipine and chlorthalidone for hypertension. They reported two brief episodes of chest pain lasting for 15 seconds each in the last week, which occurred during their normal daily activities and not during exercise. The patient's EKG showed AV block with a rate of 58 and a new  left bundle branch block compared to their EKG from 2014.       Past Medical History:  Diagnosis Date   Allergy    Arthritis    Cataract    Chronic renal insufficiency, stage II (mild)    CrCl 60s   Erectile dysfunction    GERD (gastroesophageal reflux disease)    Hearing impaired    Herpes  zoster    R side of face   Hyperlipidemia    Hypertension    Impaired fasting glucose 2013-2015   Kidney stones    Personal history of kidney stones    Recurrent herpes labialis      Social History   Socioeconomic History   Marital status: Married    Spouse name: Not on file   Number of children: 0   Years of education: Not on file   Highest education level: Some college, no degree  Occupational History   Occupation: retired  Tobacco Use   Smoking status: Former    Current packs/day: 0.50    Average packs/day: 0.5 packs/day for 15.0 years (7.5 ttl pk-yrs)    Types: Cigarettes   Smokeless tobacco: Never  Vaping Use   Vaping status: Never Used  Substance and Sexual Activity   Alcohol use: Yes    Alcohol/week: 13.0 standard drinks of alcohol    Types: 1 Glasses of wine, 12 Cans of beer per week    Comment: drink beer   Drug use: No   Sexual activity: Yes    Birth control/protection: None  Other Topics Concern   Not on file  Social History Narrative   Married, no children.   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   No T/A/Ds.   Exercise: stationary bike/nautilus 5 days a week.         Social Determinants of Corporate investment banker  Strain: Low Risk  (05/08/2023)   Overall Financial Resource Strain (CARDIA)    Difficulty of Paying Living Expenses: Not hard at all  Food Insecurity: No Food Insecurity (06/14/2023)   Hunger Vital Sign    Worried About Running Out of Food in the Last Year: Never true    Ran Out of Food in the Last Year: Never true  Transportation Needs: No Transportation Needs (06/14/2023)   PRAPARE - Administrator, Civil Service (Medical): No    Lack of Transportation (Non-Medical): No  Physical Activity: Sufficiently Active (05/08/2023)   Exercise Vital Sign    Days of Exercise per Week: 5 days    Minutes of Exercise per Session: 90 min  Stress: No Stress Concern Present (06/14/2023)   [employer] of Occupational Health -  Occupational Stress Questionnaire    Feeling of Stress : Not at all  Social Connections: Socially Isolated (06/14/2023)   Social Connection and Isolation Panel [NHANES]    Frequency of Communication with Friends and Family: Once a week    Frequency of Social Gatherings with Friends and Family: Once a week    Attends Religious Services: Never    Database administrator or Organizations: No    Attends Banker Meetings: Patient declined    Marital Status: Married  Catering manager Violence: Not At Risk (04/18/2022)   Humiliation, Afraid, Rape, and Kick questionnaire    Fear of Current or Ex-Partner: No    Emotionally Abused: No    Physically Abused: No    Sexually Abused: No    Past Surgical History:  Procedure Laterality Date   COLONOSCOPY  2002; 2014   Repeat 2024   LITHOTRIPSY      Family History  Problem Relation Age of Onset   Hearing loss Mother    Diabetes Father    Colon cancer Neg Hx    Stomach cancer Neg Hx    Esophageal cancer Neg Hx     Allergies  Allergen Reactions   Atenolol     bradycarda- heart rate down to 38 bpm   Demerol Nausea And Vomiting    vomiting    Current Outpatient Medications on File Prior to Visit  Medication Sig Dispense Refill   amLODipine (NORVASC) 10 MG tablet Take 1 tablet (10 mg total) by mouth daily. 90 tablet 3   atorvastatin (LIPITOR) 20 MG tablet TAKE 1 TABLET BY MOUTH DAILY 90 tablet 3   chlorthalidone (HYGROTON) 25 MG tablet TAKE 1 TABLET BY MOUTH DAILY 90 tablet 0   omeprazole (PRILOSEC) 20 MG capsule TAKE 1 CAPSULE BY MOUTH EVERY MORNING 30 MINUTES BEFORE BREAKFAST 90 capsule 3   tadalafil (CIALIS) 20 MG tablet TAKE 1/2 TO 1 TABLET BY MOUTH EVERY OTHER DAY AS NEEDED FOR ERECTILE DYSFUNCTION 10 tablet 1   No current facility-administered medications on file prior to visit.    BP (!) 148/80   Pulse 85   Temp 98.9 F (37.2 C) (Oral)   Resp 18   Ht 5\' 8"  (1.727 m)   Wt 195 lb (88.5 kg)   SpO2 97%   BMI 29.65  kg/m      Review of Systems  Constitutional:  Negative for chills, fatigue and fever.       No current symptoms on exam. Negative ros.  Respiratory:  Negative for cough, chest tightness, shortness of breath and wheezing.   Cardiovascular:  Negative for chest pain and palpitations.  Gastrointestinal:  Negative for abdominal pain, constipation, diarrhea,  rectal pain and vomiting.  Genitourinary:  Negative for dysuria, frequency and urgency.  Musculoskeletal:  Negative for back pain and myalgias.  Skin:  Negative for rash.  Neurological:  Negative for dizziness, speech difficulty, weakness and light-headedness.  Hematological:  Negative for adenopathy. Does not bruise/bleed easily.  Psychiatric/Behavioral:  Negative for behavioral problems, decreased concentration and suicidal ideas. The patient is not nervous/anxious.        Objective:   Physical Exam  General Mental Status- Alert. General Appearance- Not in acute distress.   Skin General: Color- Normal Color. Moisture- Normal Moisture.  Neck Carotid Arteries- Normal color. Moisture- Normal Moisture. No carotid bruits. No JVD.  Chest and Lung Exam Auscultation: Breath Sounds:-clear even unalbored.  Cardiovascular Auscultation:Rythm- Regular, rate and rhythm. Murmurs & Other Heart Sounds:Auscultation of the heart reveals- No Murmurs.  Abdomen Inspection:-Inspeection Normal. Palpation/Percussion:Note:No mass. Palpation and Percussion of the abdomen reveal- Non Tender, Non Distended + BS, no rebound or guarding.    Neurologic Cranial Nerve exam:- CN III-XII intact(No nystagmus), symmetric smile. Strength:- 5/5 equal and symmetric strength both upper and lower extremities.   Lower ext- no pedal edema. Calfs symmetric.    Assessment & Plan:   Assessment and Plan    Hypertension -when I checked blood pressure actually high readings and you are on blood pressure reading. makes me question blood pressure readings given  by [facility] machine.  Reported BP of 90/77 with associated blurred vision, dizziness, and confusion last week. Recent stressors noted. Subsequent readings varied, with some improvement. No current symptoms of dizziness or blurred vision. Adequate hydration and nutrition reported. -Purchase a new home BP machine and check BP on 09/05/2023, 09/06/2023, and 09/07/2023. -Report readings on 09/08/2023 for further assessment and potential medication adjustment. -Complete CBC and metabolic panel to assess for potential anemia or dehydration.  Bradycardia with left bundle branch block. New bundle branch block noted on EKG. Minimal, brief chest pain reported twice last week, but no current pain. No exercise-induced chest pain. -Refer to cardiology for further evaluation. -If recurrent chest pain, any left shoulder pain, left ar arm pain, shortness of breath, or lightheadedness occur, recommend emergency department evaluation. -Did get opinion from Dr. Dewayne Shorter. who agreed with Left bbb but did not think necessary to go to ED under recent described minimal chest pain one week ago but none now. Also no acute symptom or cardiac associated like signs/symptoms.       Pt expresses understanding regarding gong to ED for any above cecurrent signs/symptoms   Placing referral to cardiologist office.  Follow up date to be determined after lab review, bp readings update and how you do symptomatically pending cardiology appointment. Remember to stay hydrated. Currently continue same meds/no changes. Not to exercise presently as well.  Time spent with patient today was  50+ minutes which consisted of chart review, discussing diagnosis, work up treatment and documentation. Note also discussed pt presentation with Dr. Mariann Laster by phone. Review EKG.

## 2023-09-04 NOTE — Telephone Encounter (Signed)
Will you see cardiology referral and see if they might be able to get pt in this week. Will you give me update what they say?

## 2023-09-04 NOTE — Patient Instructions (Signed)
Hypertension -when I checked blood pressure actually high readings and you are on blood pressure reading. makes me question blood pressure readings given by [facility] machine.  Reported BP of 90/77 with associated blurred vision, dizziness, and confusion last week. Recent stressors noted. Subsequent readings varied, with some improvement. No current symptoms of dizziness or blurred vision. Adequate hydration and nutrition reported. -Purchase a new home BP machine and check BP on 09/05/2023, 09/06/2023, and 09/07/2023. -Report readings on 09/08/2023 for further assessment and potential medication adjustment. -Complete CBC and metabolic panel to assess for potential anemia or dehydration.  Bradycardia with left bundle branch block. New bundle branch block noted on EKG. Minimal, brief chest pain reported twice last week, but no current pain. No exercise-induced chest pain. -Refer to cardiology for further evaluation. -If recurrent chest pain, any left shoulder pain, left ar arm pain, shortness of breath, or lightheadedness occur, recommend emergency department evaluation. -Did get opinion from Dr. Dewayne Shorter. who agreed with Left bbb but did not think necessary to go to ED under recent described minimal chest pain one week ago but none now. Also no acute symptom or cardiac associated like signs/symptoms.

## 2023-09-04 NOTE — Telephone Encounter (Signed)
FYI: This call has been transferred to triage nurse: the Triage Nurse. Once the result note has been entered staff can address the message at that time.  Patient called in with the following symptoms:  Red Word: Low BP, Lightheadedness,Blurred Vision   Please advise at Mobile [phone] (mobile)  Message is routed to Provider Pool.

## 2023-09-04 NOTE — Telephone Encounter (Signed)
Pt seeing PCP at 4:00 pm

## 2023-09-07 ENCOUNTER — Telehealth: Payer: Self-pay | Admitting: Medical

## 2023-09-07 NOTE — Telephone Encounter (Signed)
Patient said that he was supposed to keep record of his bp for 3 days but could not figure out how to send to [place] through [facility].   Tuesday morning 166/3 Wednesday morning 144/87 Today 152/77

## 2023-09-08 ENCOUNTER — Emergency Department (HOSPITAL_BASED_OUTPATIENT_CLINIC_OR_DEPARTMENT_OTHER): Payer: Medicare PPO

## 2023-09-08 ENCOUNTER — Emergency Department (HOSPITAL_BASED_OUTPATIENT_CLINIC_OR_DEPARTMENT_OTHER)
Admission: EM | Admit: 2023-09-08 | Discharge: 2023-09-08 | Disposition: A | Discharge status: Home / Self Care | Payer: Medicare PPO | Attending: Emergency Medicine | Admitting: Emergency Medicine

## 2023-09-08 ENCOUNTER — Other Ambulatory Visit: Payer: Self-pay

## 2023-09-08 ENCOUNTER — Encounter (HOSPITAL_BASED_OUTPATIENT_CLINIC_OR_DEPARTMENT_OTHER): Payer: Self-pay

## 2023-09-08 ENCOUNTER — Ambulatory Visit (INDEPENDENT_AMBULATORY_CARE_PROVIDER_SITE_OTHER): Payer: Medicare PPO | Admitting: Medical

## 2023-09-08 VITALS — BP 150/80 | HR 58 | Temp 97.8°F | Resp 16 | Ht 68.0 in | Wt 193.4 lb

## 2023-09-08 DIAGNOSIS — I129 Hypertensive chronic kidney disease with stage 1 through stage 4 chronic kidney disease, or unspecified chronic kidney disease: Secondary | ICD-10-CM | POA: Diagnosis not present

## 2023-09-08 DIAGNOSIS — R0789 Other chest pain: Secondary | ICD-10-CM

## 2023-09-08 DIAGNOSIS — I10 Essential (primary) hypertension: Secondary | ICD-10-CM | POA: Diagnosis not present

## 2023-09-08 DIAGNOSIS — F439 Reaction to severe stress, unspecified: Secondary | ICD-10-CM

## 2023-09-08 DIAGNOSIS — R079 Chest pain, unspecified: Secondary | ICD-10-CM | POA: Diagnosis not present

## 2023-09-08 DIAGNOSIS — N182 Chronic kidney disease, stage 2 (mild): Secondary | ICD-10-CM | POA: Diagnosis not present

## 2023-09-08 DIAGNOSIS — Z79899 Other long term (current) drug therapy: Secondary | ICD-10-CM | POA: Diagnosis not present

## 2023-09-08 DIAGNOSIS — J90 Pleural effusion, not elsewhere classified: Secondary | ICD-10-CM | POA: Diagnosis not present

## 2023-09-08 LAB — BASIC METABOLIC PANEL
Anion gap: 11 (ref 5–15)
BUN: 20 mg/dL (ref 8–23)
CO2: 23 mmol/L (ref 22–32)
Calcium: 8.9 mg/dL (ref 8.9–10.3)
Chloride: 104 mmol/L (ref 98–111)
Creatinine, Ser: 0.82 mg/dL (ref 0.61–1.24)
GFR, Estimated: >60 mL/min (ref >60)
Glucose, Bld: 105 mg/dL — ABNORMAL HIGH (ref 70–99)
Potassium: 3.9 mmol/L (ref 3.5–5.1)
Sodium: 138 mmol/L (ref 135–145)

## 2023-09-08 LAB — CBC
HCT: 43.1 % (ref 39.0–52.0)
Hemoglobin: 14.7 g/dL (ref 13.0–17.0)
MCH: 29.2 pg (ref 26.0–34.0)
MCHC: 34.1 g/dL (ref 30.0–36.0)
MCV: 85.7 fL (ref 80.0–100.0)
Platelets: 296 10*3/uL (ref 150–400)
RBC: 5.03 MIL/uL (ref 4.22–5.81)
RDW: 12.4 % (ref 11.5–15.5)
WBC: 5.3 10*3/uL (ref 4.0–10.5)
nRBC: 0 % (ref 0.0–0.2)

## 2023-09-08 LAB — TROPONIN I (HIGH SENSITIVITY): Troponin I (High Sensitivity): 14 ng/L (ref <18)

## 2023-09-08 NOTE — ED Provider Notes (Signed)
[place] [facility] Provider Note   CSN: [id number] Arrival date & time: 09/08/23  1050     History  Chief Complaint  Patient presents with   Chest Pain    Leroy Proctor is a 72 y.o. male.   Chest Pain Patient presents sent from PCPs office for chest pain.  Around 3 weeks ago had a brief episode of sharp chest pain.  Has been following blood pressure at home.  PCP had told him not to exercise.  Went to follow-up appointment today to talk about exercise.  Reportedly has had a mild dull chest pain for the last week.  When previously had been working out no pain.  No fevers.  No coughing.  No known cardiac history.  Does have history of hypertension however.    Past Medical History:  Diagnosis Date   Allergy    Arthritis    Cataract    Chronic renal insufficiency, stage II (mild)    CrCl 60s   Erectile dysfunction    GERD (gastroesophageal reflux disease)    Hearing impaired    Herpes zoster    R side of face   Hyperlipidemia    Hypertension    Impaired fasting glucose 2013-2015   Kidney stones    Personal history of kidney stones    Recurrent herpes labialis     Home Medications Prior to Admission medications   Medication Sig Start Date End Date Taking? Authorizing Provider  amLODipine (NORVASC) 10 MG tablet Take 1 tablet (10 mg total) by mouth daily. 06/19/23   Saguier, Ramon Dredge, PA-C  atorvastatin (LIPITOR) 20 MG tablet TAKE 1 TABLET BY MOUTH DAILY 08/29/23   Saguier, Ramon Dredge, PA-C  chlorthalidone (HYGROTON) 25 MG tablet TAKE 1 TABLET BY MOUTH DAILY 08/28/23   Saguier, Ramon Dredge, PA-C  omeprazole (PRILOSEC) 20 MG capsule TAKE 1 CAPSULE BY MOUTH EVERY MORNING 30 MINUTES BEFORE BREAKFAST 06/19/23   Saguier, Ramon Dredge, PA-C  tadalafil (CIALIS) 20 MG tablet TAKE 1/2 TO 1 TABLET BY MOUTH EVERY OTHER DAY AS NEEDED FOR ERECTILE DYSFUNCTION 08/22/23   Saguier, Ramon Dredge, PA-C      Allergies    Atenolol and Demerol    Review of Systems   Review of  Systems  Cardiovascular:  Positive for chest pain.    Physical Exam Updated Vital Signs BP (!) 158/82   Pulse (!) 53   Temp 97.6 F (36.4 C)   Resp 13   Ht 5\' 8"  (1.727 m)   Wt 87 kg   SpO2 97%   BMI 29.16 kg/m  Physical Exam Vitals and nursing note reviewed.  Cardiovascular:     Rate and Rhythm: Normal rate and regular rhythm.  Pulmonary:     Breath sounds: No decreased breath sounds or wheezing.  Chest:     Chest wall: No tenderness.  Abdominal:     Tenderness: There is no abdominal tenderness.  Skin:    Capillary Refill: Capillary refill takes less than 2 seconds.  Neurological:     Mental Status: He is alert.     ED Results / Procedures / Treatments   Labs (all labs ordered are listed, but only abnormal results are displayed) Labs Reviewed  BASIC METABOLIC PANEL - Abnormal; Notable for the following components:      Result Value   Glucose, Bld 105 (*)    All other components within normal limits  CBC  TROPONIN I (HIGH SENSITIVITY)    EKG EKG Interpretation Date/Time:  Friday September 08 2023 10:58:29 EDT Ventricular Rate:  51 PR Interval:  289 QRS Duration:  154 QT Interval:  512 QTC Calculation: 472 R Axis:   -5  Text Interpretation: Sinus rhythm Ventricular premature complex Prolonged PR interval Left bundle branch block Confirmed by Benjiman Core [phone]) on 09/08/2023 11:01:53 AM  Radiology DG Chest 2 View  Result Date: 09/08/2023 CLINICAL DATA:  Chest pain.  Tightness for 3 weeks ago EXAM: CHEST - 2 VIEW COMPARISON:  None Available. FINDINGS: No consolidation, pneumothorax or edema. Normal cardiopericardial silhouette. The posterior costophrenic angles are blunted. Tiny effusions are possible. Overlapping cardiac leads. Dense tiny nodule right upper lung, likely calcified and related to old granulomatous disease. IMPRESSION: Tiny effusions Electronically Signed   By: Karen Kays M.D.   On: 09/08/2023 12:33    Procedures Procedures     Medications Ordered in ED Medications - No data to display  ED Course/ Medical Decision Making/ A&P                                 Medical Decision Making Amount and/or Complexity of Data Reviewed Labs: ordered. Radiology: ordered.   Patient with dull chest pain.  Reportedly has issues with blood pressure going higher and lower.  EKG shows left bundle branch block.  Relatively new.  May have a dull chest pain.  Doubt cardiac arrhythmia but will check troponin for ischemia.  Also has had blood pressure issues that are being followed by PCP.  Reviewed PCP notes.  Chest x-ray reassuring.  Only small effusions.  Blood work reassuring.  Troponin negative.  Appears stable for discharge home.  Discussed with patient and he is anxious to get back to exercising.  He has not had any exertional chest pain along the way.  I think it is reasonable to restart as long as any shortness of breath or chest pain would cause him to stop exercising.  Patient's PCP is arranging cardiology follow-up.  Will discharge home.        Final Clinical Impression(s) / ED Diagnoses Final diagnoses:  Nonspecific chest pain    Rx / DC Orders ED Discharge Orders     None         Benjiman Core, MD 09/08/23 1337

## 2023-09-08 NOTE — ED Triage Notes (Signed)
The patient had chest tightness three weeks ago. He was seen at his PCP on Monday. Then they advised him to check his blood pressure twice daily for three days. Also set him up with cardiology in November.  He stated he went back to PCP today and they wanted him to come get checked out in the ER. No pain or tightness at this time. No cough, shortness of breath or fever.

## 2023-09-08 NOTE — Progress Notes (Signed)
Subjective:    Patient ID: Leroy Proctor, male    DOB: 10-05-1951, 72 y.o.   MRN: [id number]  HPI   Pt in for follow up.   Pt sent recent readings by my chart.  Tuesday morning 166/3 Wednesday [id number] Today 152/77  Pt has not been checking his Bp at [facility] was very low. Which I did not think were accurate and did not want to make a change to his blood treatment regimen.     Pt again brings up his wife who has dementia. And pt is stressed.Recently very stressed. Example is wife took cars and his phone. He did not know where she was weeks ago.   Now he is saying that his  had blurred vision dizzinses and confusion around 09-04-2023 and he thinks that was stress related.   Pt states has feeling like slight constant pressure to chest since last visit.     Last visit AVS  "Hypertension -when I checked blood pressure actually high readings and you are on blood pressure reading. makes me question blood pressure readings given by [facility] machine.  Reported BP of 90/77 with associated blurred vision, dizziness, and confusion last week. Recent stressors noted. Subsequent readings varied, with some improvement. No current symptoms of dizziness or blurred vision. Adequate hydration and nutrition reported. -Purchase a new home BP machine and check BP on 09/05/2023, 09/06/2023, and 09/07/2023. -Report readings on 09/08/2023 for further assessment and potential medication adjustment. -Complete CBC and metabolic panel to assess for potential anemia or dehydration.   Bradycardia with left bundle branch block. New bundle branch block noted on EKG. Minimal, brief chest pain reported twice last week, but no current pain. No exercise-induced chest pain. -Refer to cardiology for further evaluation. -If recurrent chest pain, any left shoulder pain, left ar arm pain, shortness of breath, or lightheadedness occur, recommend emergency department evaluation. -Did get opinion from Dr. Dewayne Shorter. who  agreed with Left bbb but did not think necessary to go to ED under recent described minimal chest pain one week ago but none now. Also no acute symptom or cardiac associated like signs/symptoms.       Pt expresses understanding regarding gong to ED for any above cecurrent signs/symptoms     Placing referral to cardiologist office."  Pt advises me his appointment is in November.  Pt tells me the pain has been present in his chest since last visit.    The 10-year ASCVD risk score (Arnett DK, et al., 2019) is: 28.3%   Values used to calculate the score:     Age: 28 years     Sex: Male     Is Non-Hispanic African American: No     Diabetic: No     Tobacco smoker: No     Systolic Blood Pressure: 150 mmHg     Is BP treated: Yes     HDL Cholesterol: 49.8 mg/dL     Total Cholesterol: 168 mg/dL    Review of Systems  Constitutional:  Negative for chills, fatigue and fever.  Respiratory:  Negative for cough, chest tightness, shortness of breath and wheezing.   Cardiovascular:  Positive for chest pain. Negative for palpitations.       Atypical low level pain  Gastrointestinal:  Negative for abdominal pain and blood in stool.  Musculoskeletal:  Negative for back pain.  Neurological:  Negative for dizziness, speech difficulty, numbness and headaches.  Hematological:  Negative for adenopathy. Does not bruise/bleed easily.  Psychiatric/Behavioral:  Negative for behavioral  problems and decreased concentration.    Past Medical History:  Diagnosis Date   Allergy    Arthritis    Cataract    Chronic renal insufficiency, stage II (mild)    CrCl 60s   Erectile dysfunction    GERD (gastroesophageal reflux disease)    Hearing impaired    Herpes zoster    R side of face   Hyperlipidemia    Hypertension    Impaired fasting glucose 2013-2015   Kidney stones    Personal history of kidney stones    Recurrent herpes labialis      Social History   Socioeconomic History   Marital status:  Married    Spouse name: Not on file   Number of children: 0   Years of education: Not on file   Highest education level: Some college, no degree  Occupational History   Occupation: retired  Tobacco Use   Smoking status: Former    Current packs/day: 0.50    Average packs/day: 0.5 packs/day for 15.0 years (7.5 ttl pk-yrs)    Types: Cigarettes   Smokeless tobacco: Never  Vaping Use   Vaping status: Never Used  Substance and Sexual Activity   Alcohol use: Yes    Alcohol/week: 13.0 standard drinks of alcohol    Types: 1 Glasses of wine, 12 Cans of beer per week    Comment: drink beer   Drug use: No   Sexual activity: Yes    Birth control/protection: None  Other Topics Concern   Not on file  Social History Narrative   Married, no children.   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   No T/A/Ds.   Exercise: stationary bike/nautilus 5 days a week.         Social Determinants of Health   Financial Resource Strain: Low Risk  (05/08/2023)   Overall Financial Resource Strain (CARDIA)    Difficulty of Paying Living Expenses: Not hard at all  Food Insecurity: No Food Insecurity (06/14/2023)   Hunger Vital Sign    Worried About Running Out of Food in the Last Year: Never true    Ran Out of Food in the Last Year: Never true  Transportation Needs: No Transportation Needs (06/14/2023)   PRAPARE - Administrator, Civil Service (Medical): No    Lack of Transportation (Non-Medical): No  Physical Activity: Sufficiently Active (05/08/2023)   Exercise Vital Sign    Days of Exercise per Week: 5 days    Minutes of Exercise per Session: 90 min  Stress: No Stress Concern Present (06/14/2023)   [employer] of Occupational Health - Occupational Stress Questionnaire    Feeling of Stress : Not at all  Social Connections: Socially Isolated (06/14/2023)   Social Connection and Isolation Panel [NHANES]    Frequency of Communication with Friends and Family: Once a week    Frequency of  Social Gatherings with Friends and Family: Once a week    Attends Religious Services: Never    Database administrator or Organizations: No    Attends Banker Meetings: Patient declined    Marital Status: Married  Catering manager Violence: Not At Risk (04/18/2022)   Humiliation, Afraid, Rape, and Kick questionnaire    Fear of Current or Ex-Partner: No    Emotionally Abused: No    Physically Abused: No    Sexually Abused: No    Past Surgical History:  Procedure Laterality Date   COLONOSCOPY  2002; 2014   Repeat 2024  LITHOTRIPSY      Family History  Problem Relation Age of Onset   Hearing loss Mother    Diabetes Father    Colon cancer Neg Hx    Stomach cancer Neg Hx    Esophageal cancer Neg Hx     Allergies  Allergen Reactions   Atenolol     bradycarda- heart rate down to 38 bpm   Demerol Nausea And Vomiting    vomiting    Current Outpatient Medications on File Prior to Visit  Medication Sig Dispense Refill   amLODipine (NORVASC) 10 MG tablet Take 1 tablet (10 mg total) by mouth daily. 90 tablet 3   atorvastatin (LIPITOR) 20 MG tablet TAKE 1 TABLET BY MOUTH DAILY 90 tablet 3   chlorthalidone (HYGROTON) 25 MG tablet TAKE 1 TABLET BY MOUTH DAILY 90 tablet 0   omeprazole (PRILOSEC) 20 MG capsule TAKE 1 CAPSULE BY MOUTH EVERY MORNING 30 MINUTES BEFORE BREAKFAST 90 capsule 3   tadalafil (CIALIS) 20 MG tablet TAKE 1/2 TO 1 TABLET BY MOUTH EVERY OTHER DAY AS NEEDED FOR ERECTILE DYSFUNCTION 10 tablet 1   No current facility-administered medications on file prior to visit.    BP (!) 150/80   Pulse (!) 58   Temp 97.8 F (36.6 C) (Oral)   Resp 16   Ht 5\' 8"  (1.727 m)   Wt 193 lb 6.4 oz (87.7 kg)   SpO2 97%   BMI 29.41 kg/m        Objective:   Physical Exam  General Mental Status- Alert. General Appearance- Not in acute distress.   Skin General: Color- Normal Color. Moisture- Normal Moisture.  Neck Carotid Arteries- Normal color. Moisture- Normal  Moisture. No carotid bruits. No JVD.  Chest and Lung Exam Auscultation: Breath Sounds:-Normal.  Cardiovascular Auscultation:Rythm- Regular. Murmurs & Other Heart Sounds:Auscultation of the heart reveals- No Murmurs.  Abdomen Inspection:-Inspeection Normal. Palpation/Percussion:Note:No mass. Palpation and Percussion of the abdomen reveal- Non Tender, Non Distended + BS, no rebound or guarding.  Neurologic Cranial Nerve exam:- CN III-XII intact(No nystagmus), symmetric smile. Strength:- 5/5 equal and symmetric strength both upper and lower extremities.       Assessment & Plan:   Patient Instructions  Reason to to transient intermittent episodes of chest pain lasting for 15 seconds about 3 weeks ago.  Now on today's visit reporting low-level slight pressure for 1 week although not on activity.  Last week you had EKG that showed what appeared to be new left bundle branch block which I did review with cardiologist Dr. Bing Matter.  We both thought at the time that emergency department evaluation was not necessary since you were stable and your chest pain episodes were in the prior weeks.  I had intended and hope that we can get you a quick appointment with cardiologist but that appointment presently scheduled for November.  In light of current chest pain and your overall cardiovascular risk score I do think is best that you be evaluated in the emergency department.  Appropriate workup will be done.  I will get an update on the visit.  I will communicate with Dr. Bing Matter the results and try to get you in with cardiologist quicker.  Your blood pressure has consistently been on the mild high side.  I do think the [employer] readings were inaccurate.  Blood pressure is little high today.  I will wait for the emergency department evaluation to be done and decide on the possibility making adjustment to your blood pressure med regimen.  Follow-up date to be determined after emergency department note  review   Esperanza Richters, PA-C    Time spent with patient today was  42 minutes which consisted of chart review, discussing diagnosis, work up ,treatment , reviewing pt visit hx last vistt/this visit witih ED MD before sending down to ED and documentation.

## 2023-09-08 NOTE — Patient Instructions (Addendum)
Reason to to transient intermittent episodes of chest pain lasting for 15 seconds about 3 weeks ago.  Now on today's visit reporting low-level slight pressure for 1 week although not on activity.  Last week you had EKG that showed what appeared to be new left bundle branch block which I did review with cardiologist Dr. Bing Matter.  We both thought at the time that emergency department evaluation was not necessary since you were stable and your chest pain episodes were in the prior weeks.  EKG today the same as last time bradycarida. Lbb.   I had intended and hope that we can get you a quick appointment with cardiologist but that appointment presently scheduled for November.  In light of current chest pain and your overall cardiovascular risk score I do think is best that you be evaluated in the emergency department.  Appropriate workup will be done.  I will get an update on the visit.  I will communicate with Dr. Bing Matter the results and try to get you in with cardiologist quicker.  Your blood pressure has consistently been on the mild high side.  I do think the [employer] readings were inaccurate.  Blood pressure is little high today.  I will wait for the emergency department evaluation to be done and decide on the possibility making adjustment to your blood pressure med regimen.  Follow-up date to be determined after emergency department note review

## 2023-09-08 NOTE — Discharge Instructions (Addendum)
Follow-up with cardiology as planned °

## 2023-09-08 NOTE — Telephone Encounter (Signed)
Pt has a follow up appt today with PCP

## 2023-09-11 NOTE — Addendum Note (Signed)
Addended by: Gwenevere Abbot on: 09/11/2023 12:51 PM   Modules accepted: Orders

## 2023-09-12 ENCOUNTER — Encounter: Payer: Self-pay | Admitting: Medical

## 2023-09-15 ENCOUNTER — Other Ambulatory Visit: Payer: Self-pay

## 2023-09-15 DIAGNOSIS — Z87442 Personal history of urinary calculi: Secondary | ICD-10-CM | POA: Insufficient documentation

## 2023-09-15 DIAGNOSIS — K219 Gastro-esophageal reflux disease without esophagitis: Secondary | ICD-10-CM | POA: Insufficient documentation

## 2023-09-15 DIAGNOSIS — T7840XA Allergy, unspecified, initial encounter: Secondary | ICD-10-CM | POA: Insufficient documentation

## 2023-09-15 DIAGNOSIS — H269 Unspecified cataract: Secondary | ICD-10-CM | POA: Insufficient documentation

## 2023-09-15 DIAGNOSIS — M1990 Unspecified osteoarthritis, unspecified site: Secondary | ICD-10-CM | POA: Insufficient documentation

## 2023-09-15 DIAGNOSIS — B029 Zoster without complications: Secondary | ICD-10-CM | POA: Insufficient documentation

## 2023-09-15 DIAGNOSIS — N182 Chronic kidney disease, stage 2 (mild): Secondary | ICD-10-CM | POA: Insufficient documentation

## 2023-09-19 ENCOUNTER — Ambulatory Visit: Payer: Medicare PPO | Attending: Cardiology

## 2023-09-19 VITALS — BP 148/72 | HR 67 | Ht 68.0 in | Wt 195.6 lb

## 2023-09-19 DIAGNOSIS — I493 Ventricular premature depolarization: Secondary | ICD-10-CM | POA: Diagnosis not present

## 2023-09-19 DIAGNOSIS — I447 Left bundle-branch block, unspecified: Secondary | ICD-10-CM

## 2023-09-19 DIAGNOSIS — I10 Essential (primary) hypertension: Secondary | ICD-10-CM

## 2023-09-19 DIAGNOSIS — I25110 Atherosclerotic heart disease of native coronary artery with unstable angina pectoris: Secondary | ICD-10-CM

## 2023-09-19 DIAGNOSIS — R079 Chest pain, unspecified: Secondary | ICD-10-CM

## 2023-09-19 HISTORY — DX: Left bundle-branch block, unspecified: I44.7

## 2023-09-19 HISTORY — DX: Chest pain, unspecified: R07.9

## 2023-09-19 HISTORY — DX: Ventricular premature depolarization: I49.3

## 2023-09-19 MED ORDER — ASPIRIN 81 MG PO TBEC: 81.0000 mg | DELAYED_RELEASE_TABLET | Freq: Every day | ORAL | 3 refills | Status: DC

## 2023-09-19 MED ORDER — METOPROLOL TARTRATE 25 MG PO TABS: 25.0000 mg | ORAL_TABLET | Freq: Two times a day (BID) | ORAL | 3 refills | Status: DC

## 2023-09-19 NOTE — Assessment & Plan Note (Signed)
Longstanding history.  Appears stable. Will add metoprolol tartrate 25 mg twice daily for his frequent PVCs in this setting will cut back on his current regimen and discontinue chlorthalidone. Advised to continue keeping a log of his readings at home if blood pressure persistently above 130 over 80 mmHg should notify us or his PCP.

## 2023-09-19 NOTE — Assessment & Plan Note (Signed)
Unclear for how long he has had this. No prior structural and functional assessment, no prior CAD assessment.  Now in the setting of atypical chest pain as reviewed above will proceed with echocardiogram and CTA coronary angiogram.

## 2023-09-19 NOTE — Assessment & Plan Note (Signed)
Atypical disruption at times with exertion and also noticed at rest.  Does have cardiovascular risk factors, LBBB and frequent PVCs without any prior ischemic workup.  Suggested he start taking aspirin 81 mg once daily Will obtain further evaluation for CAD with CTA coronary angiogram.  Discussed the test with him at length. He is not allergic to IV contrast. Metoprolol tartrate being started as below.  Continue 25 mg twice daily and take an additional dose of 25 mg on the morning of the CT coronary angiogram.  Will also obtain a transthoracic echocardiogram for cardiac structure and function assessment.  Advised him to avoid any moderate to heavy exertion.  If he does notice chest pain that is chest persistent even at rest, should head to the ER or call 911 and not drive himself.

## 2023-09-19 NOTE — Progress Notes (Addendum)
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   Problem List Items Addressed This Visit     HTN (hypertension)    Longstanding history.  Appears stable. Will add metoprolol tartrate 25 mg twice daily for his frequent PVCs in this setting will cut back on his current regimen and discontinue chlorthalidone. Advised to continue keeping a log of his readings at home if blood pressure persistently above 130 over 80 mmHg should notify us or his PCP.       Chest pain of uncertain etiology    Atypical disruption at times with exertion and also noticed at rest.  Does have cardiovascular risk factors, LBBB and frequent PVCs without any prior ischemic workup.  Suggested he start taking aspirin 81 mg once daily Will obtain further evaluation for CAD with CTA coronary angiogram.  Discussed the test with him at length. He is not allergic to IV contrast. Metoprolol tartrate being started as below.  Continue 25 mg twice daily and take an additional dose of 25 mg on the morning of the CT coronary angiogram.  Will also obtain a transthoracic echocardiogram for cardiac structure and function assessment.  Advised him to avoid any moderate to heavy exertion.  If he does notice  chest pain that is chest persistent even at rest, should head to the ER or call 911 and not drive himself.      Left bundle branch block    Unclear for how long he has had this. No prior structural and functional assessment, no prior CAD assessment.  Now in the setting of atypical chest pain as reviewed above will proceed with echocardiogram and CTA coronary angiogram.      Frequent PVCs    Appear to be symptomatic. Structural and CAD assessment as reviewed with echocardiogram and CTA coronary angiogram.      Other Visit Diagnoses     Chest pain, unspecified type    -  Primary   Relevant Orders   EKG 12-Lead (Completed)   Precordial pain         Return to clinic based on test results.   Addendum: 09/22/2023: Abnormal CTA cors with elevated calcium score and 3vd with abnormal CT FFR Would recommend cardiac cath. Proceed with scheduling cath if he is agreeable. I will tentatively place the orders for the cath to schedule to avoid delays.  If he would like to see me in the office to review results in person before scheduling cath, will tentatively schedule at my next office day for Tuesday Afternoon next week.    History of Present Illness:    Leroy Proctor is a 72 y.o. male who is being seen today for the evaluation of chest pain at  the request of Saguier, Kateri Mc.  Has history of hypertension, hyperlipidemia, renal stones, GERD, erectile dysfunction, CKD stage II, left bundle branch block, frequent PVCs, former smoker quit 30 years ago, hard of hearing. No prior history of CAD, CHF, MI.  Pleasant gentleman here for the visit by himself.  Lives at home with his wife and was recently under stressful situations with his wife's health  Mentions recently noticed mild chest discomfort.  With review of these symptoms with his PCP along with lower blood pressures than usual with systolic in 90s, he was directed to the ER Evaluated at the emergency room September 08, 2023 for  atypical chest pain. Lab work from September 08, 2023 noted sodium 138, potassium 3.9, BUN 20, creatinine 0.82, EGFR greater than 60, High-sensitivity troponin was 14 Chest x-ray in the emergency room showed tiny bilateral pleural effusions.  Referred here for further evaluation. Denies any limitation of regular activities with day-to-day functions.  Denies any orthopnea, paroxysmal nocturnal dyspnea, pedal edema.  Describes a sensation of discomfort at times, lasting few seconds.  Has cut back on his regular exercise since the onset of symptoms few weeks ago. Denies any blood in urine or stools.  Denies any pedal edema.  Denies any claudication symptoms  Mentions blood pressures are well-controlled routinely.  EKG in the clinic today shows sinus rhythm with heart rate 67/min, prolonged PR interval 238 ms wide QRS LBBB morphology 146 ms, frequent PVCs.  In comparison prior EKG from September 08, 2023 was similar  Does not recollect kind of reaction to atenolol in the past.  Past Medical History:  Diagnosis Date   Allergy    Arthritis    Bradycardia 10/02/2013   Cataract    Chronic renal insufficiency, stage II (mild)    CrCl 60s   Cold sore 06/03/2014   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   Erectile dysfunction    GERD (gastroesophageal reflux disease)    Health maintenance examination 10/16/2014   Herpes zoster    R side of face   HTN (hypertension) 11/21/2011   Hyperlipidemia    Impaired fasting glucose 2013-2015   Kidney stones    Personal history of kidney stones    Recurrent herpes labialis    Routine general medical examination at a health care facility 10/02/2013    Past Surgical History:  Procedure Laterality Date   COLONOSCOPY  2002; 2014   Repeat 2024   LITHOTRIPSY      Current Medications: Current Meds  Medication Sig   amLODipine (NORVASC) 10 MG tablet Take 1 tablet (10 mg total) by mouth daily.    atorvastatin (LIPITOR) 20 MG tablet TAKE 1 TABLET BY MOUTH DAILY   meloxicam (MOBIC) 15 MG tablet Take 15 mg by mouth daily.   omeprazole (PRILOSEC) 20 MG capsule TAKE 1 CAPSULE BY MOUTH EVERY MORNING 30 MINUTES BEFORE BREAKFAST   tadalafil (CIALIS) 20 MG tablet TAKE 1/2 TO 1 TABLET BY MOUTH EVERY OTHER DAY AS NEEDED FOR ERECTILE DYSFUNCTION   [DISCONTINUED] chlorthalidone (HYGROTON) 25 MG tablet TAKE 1 TABLET BY MOUTH DAILY     Allergies:   Atenolol and Demerol   Social History   Socioeconomic History   Marital status: Married    Spouse name: Not on file   Number of children: 0   Years of education: Not on file   Highest education level: Some college, no degree  Occupational History   Occupation: retired  Tobacco Use   Smoking status: Former  Current packs/day: 0.50    Average packs/day: 0.5 packs/day for 15.0 years (7.5 ttl pk-yrs)    Types: Cigarettes   Smokeless tobacco: Never  Vaping Use   Vaping status: Never Used  Substance and Sexual Activity   Alcohol use: Yes    Alcohol/week: 13.0 standard drinks of alcohol    Types: 1 Glasses of wine, 12 Cans of beer per week    Comment: drink beer   Drug use: No   Sexual activity: Yes    Birth control/protection: None  Other Topics Concern   Not on file  Social History Narrative   Married, no children.   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   No T/A/Ds.   Exercise: stationary bike/nautilus 5 days a week.         Social Determinants of Health   Financial Resource Strain: Low Risk  (05/08/2023)   Overall Financial Resource Strain (CARDIA)    Difficulty of Paying Living Expenses: Not hard at all  Food Insecurity: No Food Insecurity (06/14/2023)   Hunger Vital Sign    Worried About Running Out of Food in the Last Year: Never true    Ran Out of Food in the Last Year: Never true  Transportation Needs: No Transportation Needs (06/14/2023)   PRAPARE - Administrator, Civil Service (Medical): No    Lack of  Transportation (Non-Medical): No  Physical Activity: Sufficiently Active (05/08/2023)   Exercise Vital Sign    Days of Exercise per Week: 5 days    Minutes of Exercise per Session: 90 min  Stress: No Stress Concern Present (06/14/2023)   [employer] of Occupational Health - Occupational Stress Questionnaire    Feeling of Stress : Not at all  Social Connections: Socially Isolated (06/14/2023)   Social Connection and Isolation Panel [NHANES]    Frequency of Communication with Friends and Family: Once a week    Frequency of Social Gatherings with Friends and Family: Once a week    Attends Religious Services: Never    Database administrator or Organizations: No    Attends Engineer, structural: Patient declined    Marital Status: Married     Family History: The patient's family history includes Diabetes in his father; Hearing loss in his mother. There is no history of Colon cancer, Stomach cancer, or Esophageal cancer. ROS:   Please see the history of present illness.    All 14 point review of systems negative except as described per history of present illness.  EKGs/Labs/Other Studies Reviewed:    The following studies were reviewed today:   EKG:  EKG Interpretation Date/Time:  Tuesday September 19 2023 09:31:18 EDT Ventricular Rate:  67 PR Interval:  238 QRS Duration:  146 QT Interval:  448 QTC Calculation: 473 R Axis:   -68  Text Interpretation: Sinus rhythm with 1st degree A-V block with frequent Premature ventricular complexes Left axis deviation Left bundle branch block When compared with ECG of 08-Sep-2023 10:58, PREVIOUS ECG IS PRESENT Confirmed by Huntley Dec reddy [phone]) on 09/19/2023 9:50:50 AM    Recent Labs: 09/04/2023: ALT 14 09/08/2023: BUN 20; Creatinine, Ser 0.82; Hemoglobin 14.7; Platelets 296; Potassium 3.9; Sodium 138  Recent Lipid Panel    Component Value Date/Time   CHOL 168 04/19/2022 0830   TRIG 96.0 04/19/2022 0830   HDL 49.80  04/19/2022 0830   CHOLHDL 3 04/19/2022 0830   VLDL 19.2 04/19/2022 0830   LDLCALC 99 04/19/2022 0830    Physical  Exam:    VS:  BP (!) 148/72 (BP Location: Left Arm, Patient Position: Sitting)   Pulse 67   Ht 5\' 8"  (1.727 m)   Wt 195 lb 9.6 oz (88.7 kg)   SpO2 92%   BMI 29.74 kg/m     Wt Readings from Last 3 Encounters:  09/19/23 195 lb 9.6 oz (88.7 kg)  09/08/23 191 lb 12.8 oz (87 kg)  09/08/23 193 lb 6.4 oz (87.7 kg)     GENERAL:  Well nourished, well developed in no acute distress NECK: No JVD; No carotid bruits CARDIAC: RRR, S1 and S2 present, 3/6 ejection systolic murmur best heard in aortic area CHEST:  Clear to auscultation without rales, wheezing or rhonchi  Extremities: No pitting pedal edema. Pulses bilaterally symmetric with radial 2+ and dorsalis pedis 2+ NEUROLOGIC:  Alert and oriented x 3  Medication Adjustments/Labs and Tests Ordered: Current medicines are reviewed at length with the patient today.  Concerns regarding medicines are outlined above.  Orders Placed This Encounter  Procedures   EKG 12-Lead   No orders of the defined types were placed in this encounter.   Signed, Cecille Amsterdam, MD, MPH, [facility]. 09/19/2023 10:17 AM    [facility]

## 2023-09-19 NOTE — Assessment & Plan Note (Signed)
Appear to be symptomatic. Structural and CAD assessment as reviewed with echocardiogram and CTA coronary angiogram.

## 2023-09-19 NOTE — Patient Instructions (Addendum)
Medication Instructions:  Your physician has recommended you make the following change in your medication:   STOP: Chlorthalidone START: Aspirin 81 mg daily START: Metoprolol tartrate 25 mg twice daily  *If you need a refill on your cardiac medications before your next appointment, please call your pharmacy*   Lab Work: Your physician recommends that you return for lab work in:   Labs today: BMP  If you have labs (blood work) drawn today and your tests are completely normal, you will receive your results only by: MyChart Message (if you have MyChart) OR A paper copy in the mail If you have any lab test that is abnormal or we need to change your treatment, we will call you to review the results.   Testing/Procedures: Your physician has requested that you have an echocardiogram. Echocardiography is a painless test that uses sound waves to create images of your heart. It provides your doctor with information about the size and shape of your heart and how well your heart's chambers and valves are working. This procedure takes approximately one hour. There are no restrictions for this procedure. Please do NOT wear cologne, perfume, aftershave, or lotions (deodorant is allowed). Please arrive 15 minutes prior to your appointment time.     Your cardiac CT will be scheduled at one of the below locations:   [facility] [address] [place], [place] [address] [phone]  OR  [facility] [address] [place], [place] [address] [phone]  OR   [facility] [address] [place], [place] [address] [phone]  If scheduled at [facility], please arrive at the [facility] Entrance (Entrance C2) of [facility] 30 minutes prior to test start time. You can use the FREE valet parking offered at entrance C (encouraged to control the heart rate for the test)   Proceed to the [facility] Radiology Department (first floor) to check-in and test prep.  All radiology patients and guests should use entrance C2 at [facility], accessed from [facility], even though the hospital's physical address listed is [address].    If scheduled at [facility] or [facility], please arrive 15 mins early for check-in and test prep.  There is spacious parking and easy access to the radiology department from the [facility] V[facility] entrance. Please enter here and check-in with the desk attendant.   Please follow these instructions carefully (unless otherwise directed):  An IV will be required for this test and Nitroglycerin will be given.  Hold all erectile dysfunction medications at least 3 days (72 hrs) prior to test. (Ie viagra, cialis, sildenafil, tadalafil, etc)   On the Night Before the Test: Be sure to Drink plenty of water. Do not consume any caffeinated/decaffeinated beverages or chocolate 12 hours prior to your test. Do not take any antihistamines 12 hours prior to your test.  On the Day of the Test: Drink plenty of water until 1 hour prior to the test. Do not eat any food 1 hour prior to test. You may take your regular medications prior to the test.  Take an extra metoprolol (Lopressor) two hours prior to test.         After the Test: Drink plenty of water. After receiving IV contrast, you may experience a mild flushed feeling. This is normal. On occasion, you may experience a mild rash up to 24 hours  after the test. This is not dangerous. If this occurs, you can take Benadryl 25 mg and increase your fluid intake. If you experience trouble breathing, this can be serious. If it is severe call 911 IMMEDIATELY. If it is mild, please call our office.  We will call to schedule your test 2-4 weeks out understanding that some insurance companies will need an authorization  prior to the service being performed.   For more information and frequently asked questions, please visit our website : [email or web address]  For non-scheduling related questions, please contact the cardiac imaging nurse navigator should you have any questions/concerns: Cardiac Imaging Nurse Navigators Direct Office Dial: [phone]   For scheduling needs, including cancellations and rescheduling, please call [place], [phone].    Follow-Up: At [facility], you and your health needs are our priority.  As part of our continuing mission to provide you with exceptional heart care, we have created designated Provider Care Teams.  These Care Teams include your primary Cardiologist (physician) and Advanced Practice Providers (APPs -  Physician Assistants and Nurse Practitioners) who all work together to provide you with the care you need, when you need it.  We recommend signing up for the patient portal called "MyChart".  Sign up information is provided on this After Visit Summary.  MyChart is used to connect with patients for Virtual Visits (Telemedicine).  Patients are able to view lab/test results, encounter notes, upcoming appointments, etc.  Non-urgent messages can be sent to your provider as well.   To learn more about what you can do with MyChart, go to [email or web address].    Your next appointment:   Follow to be determined after testing  Provider:   Huntley Dec, MD    Other Instructions None

## 2023-09-20 ENCOUNTER — Telehealth (HOSPITAL_COMMUNITY): Payer: Self-pay | Admitting: *Deleted

## 2023-09-20 LAB — BASIC METABOLIC PANEL
BUN/Creatinine Ratio: 20 (ref 10–24)
BUN: 18 mg/dL (ref 8–27)
CO2: 24 mmol/L (ref 20–29)
Calcium: 9.2 mg/dL (ref 8.6–10.2)
Chloride: 102 mmol/L (ref 96–106)
Creatinine, Ser: 0.92 mg/dL (ref 0.76–1.27)
Glucose: 95 mg/dL (ref 70–99)
Potassium: 4.1 mmol/L (ref 3.5–5.2)
Sodium: 139 mmol/L (ref 134–144)
eGFR: 88 mL/min/{1.73_m2} (ref >59)

## 2023-09-20 NOTE — Telephone Encounter (Signed)
Patient calling about his upcoming cardiac imaging study; pt verbalizes understanding of appt date/time, parking situation and where to check in, pre-test NPO status and verified current allergies; name and call back number provided for further questions should they arise  Larey Brick RN Navigator Cardiac Imaging [facility] [phone] office [phone] cell  Patient to start his 25mg  metoprolol tartrate BID. He is aware to arrive at 1 PM

## 2023-09-21 ENCOUNTER — Ambulatory Visit (HOSPITAL_COMMUNITY)
Admission: RE | Admit: 2023-09-21 | Discharge: 2023-09-21 | Disposition: A | Discharge status: Home / Self Care | Payer: Medicare PPO | Source: Ambulatory Visit

## 2023-09-21 ENCOUNTER — Ambulatory Visit (HOSPITAL_BASED_OUTPATIENT_CLINIC_OR_DEPARTMENT_OTHER)
Admission: RE | Admit: 2023-09-21 | Discharge: 2023-09-21 | Disposition: A | Discharge status: Home / Self Care | Payer: Medicare PPO | Source: Ambulatory Visit | Attending: Internal Medicine | Admitting: Internal Medicine

## 2023-09-21 DIAGNOSIS — R931 Abnormal findings on diagnostic imaging of heart and coronary circulation: Secondary | ICD-10-CM

## 2023-09-21 DIAGNOSIS — R079 Chest pain, unspecified: Secondary | ICD-10-CM | POA: Diagnosis not present

## 2023-09-21 DIAGNOSIS — I10 Essential (primary) hypertension: Secondary | ICD-10-CM | POA: Diagnosis not present

## 2023-09-21 DIAGNOSIS — I2510 Atherosclerotic heart disease of native coronary artery without angina pectoris: Secondary | ICD-10-CM | POA: Insufficient documentation

## 2023-09-21 DIAGNOSIS — I447 Left bundle-branch block, unspecified: Secondary | ICD-10-CM | POA: Insufficient documentation

## 2023-09-21 DIAGNOSIS — I493 Ventricular premature depolarization: Secondary | ICD-10-CM | POA: Insufficient documentation

## 2023-09-21 MED ORDER — NITROGLYCERIN 0.4 MG SL SUBL
SUBLINGUAL_TABLET | SUBLINGUAL | Status: AC
  Filled 2023-09-21: qty 2

## 2023-09-21 MED ORDER — NITROGLYCERIN 0.4 MG SL SUBL
0.8000 mg | SUBLINGUAL_TABLET | SUBLINGUAL | Status: DC | PRN
  Administered 2023-09-21: 0.8 mg via SUBLINGUAL

## 2023-09-21 MED ORDER — IOHEXOL 350 MG/ML SOLN
95.0000 mL | Freq: Once | INTRAVENOUS | Status: AC | PRN
  Administered 2023-09-21: 95 mL via INTRAVENOUS

## 2023-09-21 NOTE — Progress Notes (Signed)
Pt verbalized understanding of discharge instruction; opportunity for questions provided

## 2023-09-22 NOTE — Progress Notes (Signed)
Sent a note to his MyChart. Please inform him that the results are abnormal and I would recommend a cardiac catheterization. If he is agreeable please proceed with scheduling cardiac catheterization. If he would like to see [place] in the office before scheduling the cardiac cath, please arrange him for a follow-up visit with me.  My next available office time is Tuesday afternoon. He should restrict himself to minimal light activities of day-to-day at home and avoid any moderate to heavy exertion. If there are any symptoms of chest pain he should head to the nearest ER or call 911.

## 2023-09-22 NOTE — Addendum Note (Signed)
Addended by: Huntley Dec REDDY on: 09/22/2023 11:10 AM   Modules accepted: Orders

## 2023-10-02 ENCOUNTER — Telehealth: Payer: Self-pay

## 2023-10-02 NOTE — Telephone Encounter (Signed)
Patient is returning call to discuss CT results. 

## 2023-10-04 NOTE — Telephone Encounter (Signed)
Left vm to return call  Luretha Murphy, MD 09/22/2023 11:10 AM EDT     Sent a note to his MyChart. Please inform him that the results are abnormal and I would recommend a cardiac catheterization. If he is agreeable please proceed with scheduling cardiac catheterization. If he would like to see [place] in the office before scheduling the cardiac cath, please arrange him for a follow-up visit with me.   My next available office time is Tuesday afternoon. He should restrict himself to minimal light activities of day-to-day at home and avoid any moderate to heavy exertion. If there are any symptoms of chest pain he should head to the nearest ER or call 911.

## 2023-10-04 NOTE — Telephone Encounter (Signed)
Follow Up:      Patient is returning Richard's call from today.

## 2023-10-04 NOTE — Telephone Encounter (Signed)
Pt returned the call to my number and was informed of the results. Appt made with Dr. Vincent Gros for 10/05/23 in [facility]. Pt verbalized understanding and had no additional questions.

## 2023-10-05 ENCOUNTER — Ambulatory Visit: Payer: Medicare PPO

## 2023-10-05 VITALS — BP 138/68 | HR 50 | Ht 68.0 in | Wt 200.0 lb

## 2023-10-05 DIAGNOSIS — I2510 Atherosclerotic heart disease of native coronary artery without angina pectoris: Secondary | ICD-10-CM | POA: Insufficient documentation

## 2023-10-05 DIAGNOSIS — I25110 Atherosclerotic heart disease of native coronary artery with unstable angina pectoris: Secondary | ICD-10-CM

## 2023-10-05 DIAGNOSIS — I25118 Atherosclerotic heart disease of native coronary artery with other forms of angina pectoris: Secondary | ICD-10-CM

## 2023-10-05 DIAGNOSIS — Z01812 Encounter for preprocedural laboratory examination: Secondary | ICD-10-CM

## 2023-10-05 HISTORY — DX: Atherosclerotic heart disease of native coronary artery without angina pectoris: I25.10

## 2023-10-05 MED ORDER — ATORVASTATIN CALCIUM 40 MG PO TABS
40.0000 mg | ORAL_TABLET | Freq: Every day | ORAL | 3 refills | Status: DC
Start: 2023-10-05 — End: 2023-10-18

## 2023-10-05 NOTE — Progress Notes (Signed)
Cardiology Consultation:    Date:  10/05/2023   ID:  Leroy Proctor, DOB Jul 07, 1951, MRN [id number]  PCP:  Esperanza Richters, PA-C  Cardiologist:  Marlyn Corporal Iysha Mishkin, MD   Referring MD: Marisue Brooklyn   No chief complaint on file.    ASSESSMENT AND PLAN:   Mr. 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   Problem List Items Addressed This Visit     CAD, triple-vessel disease with calcium score 1624, CAD RADS 4 study, severe stenosis in LAD, moderate stenosis in LCx and RCA, hemodynamically significant by CT FFR in all 3 territories    Symptoms of chest pain on exertion are less this afternoon since he has cut back on activity and being started on beta-blockers.  We reviewed the results from the CT coronary angiogram at length.  Further evaluation with cardiac catheterization and possible revascularization with stenting versus CABG reviewed.  Discussed procedure at length.  Shared Decision Making/Informed Consent{ The risks [stroke (1 in 1000), death (1 in 1000), kidney failure [usually temporary] (1 in 500), bleeding (1 in 200), allergic reaction [possibly serious] (1 in 200)], benefits (diagnostic support and management of coronary artery disease) and alternatives of a cardiac catheterization were discussed in detail with him and his wife and he is willing to proceed.  Will tentatively scheduled for cardiac cath next week at [facility].  Advised him to cut back on activity and avoid moderate to heavy exertion. If any symptoms of chest pain or shortness of breath occur at rest should go to the nearest ER or call 911.  Continue aspirin 81 mg once  daily Titrate up atorvastatin dose to 40 mg once daily.  Continue metoprolol tartrate 25 mg twice daily Continue amlodipine 10 mg once daily  Tentative follow-up in the clinic in about 2 to 3 weeks post procedure.  Will request echocardiogram to be scheduled earlier if possible.       Relevant Medications   atorvastatin (LIPITOR) 40 MG tablet   Other Visit Diagnoses     Pre-procedure lab exam    -  Primary   Relevant Orders   Basic metabolic panel   CBC         History of Present Illness:    Leroy Proctor is a 72 y.o. male who is being seen today for follow-up after his recent CT coronary angiogram results. PCP Saguier, Ramon Dredge, PA-C. Here for the visit accompanied by his wife  Has history of coronary atherosclerosis three-vessel, hemodynamically significant by CT FFR, hypertension, hyperlipidemia, renal stones, GERD, erectile dysfunction, CKD stage II, left bundle branch block morphology, frequent PVCs, former smoker quit 30 years ago, hard of hearing.  Recently evaluated for initial consult on 09-19-2023, was for symptoms of chest discomfort that led to ER visit earlier in October[facility]  Does elliptical training, swimming without any significant symptoms but does at times report chest discomfort and heaviness at times even at rest.  Will proceed with evaluation with CT coronary angiogram  Coronary artery disease abnormal CT coronary angiogram 09-21-2023 calcium score elevated 1624, CAD RADS 4 study with severe stenosis of proximal LAD, moderate stenosis in distal LAD, moderate stenosis proximal LCx, moderate stenosis of RCA,  Cardiology Consultation:    Date:  10/05/2023   ID:  Leroy Proctor, DOB Jul 07, 1951, MRN [id number]  PCP:  Esperanza Richters, PA-C  Cardiologist:  Marlyn Corporal Iysha Mishkin, MD   Referring MD: Marisue Brooklyn   No chief complaint on file.    ASSESSMENT AND PLAN:   Mr. 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   Problem List Items Addressed This Visit     CAD, triple-vessel disease with calcium score 1624, CAD RADS 4 study, severe stenosis in LAD, moderate stenosis in LCx and RCA, hemodynamically significant by CT FFR in all 3 territories    Symptoms of chest pain on exertion are less this afternoon since he has cut back on activity and being started on beta-blockers.  We reviewed the results from the CT coronary angiogram at length.  Further evaluation with cardiac catheterization and possible revascularization with stenting versus CABG reviewed.  Discussed procedure at length.  Shared Decision Making/Informed Consent{ The risks [stroke (1 in 1000), death (1 in 1000), kidney failure [usually temporary] (1 in 500), bleeding (1 in 200), allergic reaction [possibly serious] (1 in 200)], benefits (diagnostic support and management of coronary artery disease) and alternatives of a cardiac catheterization were discussed in detail with him and his wife and he is willing to proceed.  Will tentatively scheduled for cardiac cath next week at [facility].  Advised him to cut back on activity and avoid moderate to heavy exertion. If any symptoms of chest pain or shortness of breath occur at rest should go to the nearest ER or call 911.  Continue aspirin 81 mg once  daily Titrate up atorvastatin dose to 40 mg once daily.  Continue metoprolol tartrate 25 mg twice daily Continue amlodipine 10 mg once daily  Tentative follow-up in the clinic in about 2 to 3 weeks post procedure.  Will request echocardiogram to be scheduled earlier if possible.       Relevant Medications   atorvastatin (LIPITOR) 40 MG tablet   Other Visit Diagnoses     Pre-procedure lab exam    -  Primary   Relevant Orders   Basic metabolic panel   CBC         History of Present Illness:    Leroy Proctor is a 72 y.o. male who is being seen today for follow-up after his recent CT coronary angiogram results. PCP Saguier, Ramon Dredge, PA-C. Here for the visit accompanied by his wife  Has history of coronary atherosclerosis three-vessel, hemodynamically significant by CT FFR, hypertension, hyperlipidemia, renal stones, GERD, erectile dysfunction, CKD stage II, left bundle branch block morphology, frequent PVCs, former smoker quit 30 years ago, hard of hearing.  Recently evaluated for initial consult on 09-19-2023, was for symptoms of chest discomfort that led to ER visit earlier in October. [facility]  Does elliptical training, swimming without any significant symptoms but does at times report chest discomfort and heaviness at times even at rest.  Will proceed with evaluation with CT coronary angiogram  Coronary artery disease abnormal CT coronary angiogram 09-21-2023 calcium score elevated 1624, CAD RADS 4 study with severe stenosis of proximal LAD, moderate stenosis in distal LAD, moderate stenosis proximal LCx, moderate stenosis of RCA,  Cardiology Consultation:    Date:  10/05/2023   ID:  Leroy Proctor, DOB Jul 07, 1951, MRN [id number]  PCP:  Esperanza Richters, PA-C  Cardiologist:  Marlyn Corporal Iysha Mishkin, MD   Referring MD: Marisue Brooklyn   No chief complaint on file.    ASSESSMENT AND PLAN:   Mr. 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   Problem List Items Addressed This Visit     CAD, triple-vessel disease with calcium score 1624, CAD RADS 4 study, severe stenosis in LAD, moderate stenosis in LCx and RCA, hemodynamically significant by CT FFR in all 3 territories    Symptoms of chest pain on exertion are less this afternoon since he has cut back on activity and being started on beta-blockers.  We reviewed the results from the CT coronary angiogram at length.  Further evaluation with cardiac catheterization and possible revascularization with stenting versus CABG reviewed.  Discussed procedure at length.  Shared Decision Making/Informed Consent{ The risks [stroke (1 in 1000), death (1 in 1000), kidney failure [usually temporary] (1 in 500), bleeding (1 in 200), allergic reaction [possibly serious] (1 in 200)], benefits (diagnostic support and management of coronary artery disease) and alternatives of a cardiac catheterization were discussed in detail with him and his wife and he is willing to proceed.  Will tentatively scheduled for cardiac cath next week at [facility].  Advised him to cut back on activity and avoid moderate to heavy exertion. If any symptoms of chest pain or shortness of breath occur at rest should go to the nearest ER or call 911.  Continue aspirin 81 mg once  daily Titrate up atorvastatin dose to 40 mg once daily.  Continue metoprolol tartrate 25 mg twice daily Continue amlodipine 10 mg once daily  Tentative follow-up in the clinic in about 2 to 3 weeks post procedure.  Will request echocardiogram to be scheduled earlier if possible.       Relevant Medications   atorvastatin (LIPITOR) 40 MG tablet   Other Visit Diagnoses     Pre-procedure lab exam    -  Primary   Relevant Orders   Basic metabolic panel   CBC         History of Present Illness:    Leroy Proctor is a 72 y.o. male who is being seen today for follow-up after his recent CT coronary angiogram results. PCP Saguier, Ramon Dredge, PA-C. Here for the visit accompanied by his wife  Has history of coronary atherosclerosis three-vessel, hemodynamically significant by CT FFR, hypertension, hyperlipidemia, renal stones, GERD, erectile dysfunction, CKD stage II, left bundle branch block morphology, frequent PVCs, former smoker quit 30 years ago, hard of hearing.  Recently evaluated for initial consult on 09-19-2023, was for symptoms of chest discomfort that led to ER visit earlier in October. [facility]  Does elliptical training, swimming without any significant symptoms but does at times report chest discomfort and heaviness at times even at rest.  Will proceed with evaluation with CT coronary angiogram  Coronary artery disease abnormal CT coronary angiogram 09-21-2023 calcium score elevated 1624, CAD RADS 4 study with severe stenosis of proximal LAD, moderate stenosis in distal LAD, moderate stenosis proximal LCx, moderate stenosis of RCA,  Cardiology Consultation:    Date:  10/05/2023   ID:  Leroy Proctor, DOB Jul 07, 1951, MRN [id number]  PCP:  Esperanza Richters, PA-C  Cardiologist:  Marlyn Corporal Iysha Mishkin, MD   Referring MD: Marisue Brooklyn   No chief complaint on file.    ASSESSMENT AND PLAN:   Mr. 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   Problem List Items Addressed This Visit     CAD, triple-vessel disease with calcium score 1624, CAD RADS 4 study, severe stenosis in LAD, moderate stenosis in LCx and RCA, hemodynamically significant by CT FFR in all 3 territories    Symptoms of chest pain on exertion are less this afternoon since he has cut back on activity and being started on beta-blockers.  We reviewed the results from the CT coronary angiogram at length.  Further evaluation with cardiac catheterization and possible revascularization with stenting versus CABG reviewed.  Discussed procedure at length.  Shared Decision Making/Informed Consent{ The risks [stroke (1 in 1000), death (1 in 1000), kidney failure [usually temporary] (1 in 500), bleeding (1 in 200), allergic reaction [possibly serious] (1 in 200)], benefits (diagnostic support and management of coronary artery disease) and alternatives of a cardiac catheterization were discussed in detail with him and his wife and he is willing to proceed.  Will tentatively scheduled for cardiac cath next week at [facility].  Advised him to cut back on activity and avoid moderate to heavy exertion. If any symptoms of chest pain or shortness of breath occur at rest should go to the nearest ER or call 911.  Continue aspirin 81 mg once  daily Titrate up atorvastatin dose to 40 mg once daily.  Continue metoprolol tartrate 25 mg twice daily Continue amlodipine 10 mg once daily  Tentative follow-up in the clinic in about 2 to 3 weeks post procedure.  Will request echocardiogram to be scheduled earlier if possible.       Relevant Medications   atorvastatin (LIPITOR) 40 MG tablet   Other Visit Diagnoses     Pre-procedure lab exam    -  Primary   Relevant Orders   Basic metabolic panel   CBC         History of Present Illness:    Leroy Proctor is a 72 y.o. male who is being seen today for follow-up after his recent CT coronary angiogram results. PCP Saguier, Ramon Dredge, PA-C. Here for the visit accompanied by his wife  Has history of coronary atherosclerosis three-vessel, hemodynamically significant by CT FFR, hypertension, hyperlipidemia, renal stones, GERD, erectile dysfunction, CKD stage II, left bundle branch block morphology, frequent PVCs, former smoker quit 30 years ago, hard of hearing.  Recently evaluated for initial consult on 09-19-2023, was for symptoms of chest discomfort that led to ER visit earlier in October. [facility]  Does elliptical training, swimming without any significant symptoms but does at times report chest discomfort and heaviness at times even at rest.  Will proceed with evaluation with CT coronary angiogram  Coronary artery disease abnormal CT coronary angiogram 09-21-2023 calcium score elevated 1624, CAD RADS 4 study with severe stenosis of proximal LAD, moderate stenosis in distal LAD, moderate stenosis proximal LCx, moderate stenosis of RCA,

## 2023-10-05 NOTE — H&P (View-Only) (Signed)
Cardiology Consultation:    Date:  10/05/2023   ID:  Leroy Proctor, DOB Jul 07, 1951, MRN [id number]  PCP:  Esperanza Richters, PA-C  Cardiologist:  Marlyn Corporal Iysha Mishkin, MD   Referring MD: Marisue Brooklyn   No chief complaint on file.    ASSESSMENT AND PLAN:   Mr. 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   Problem List Items Addressed This Visit     CAD, triple-vessel disease with calcium score 1624, CAD RADS 4 study, severe stenosis in LAD, moderate stenosis in LCx and RCA, hemodynamically significant by CT FFR in all 3 territories    Symptoms of chest pain on exertion are less this afternoon since he has cut back on activity and being started on beta-blockers.  We reviewed the results from the CT coronary angiogram at length.  Further evaluation with cardiac catheterization and possible revascularization with stenting versus CABG reviewed.  Discussed procedure at length.  Shared Decision Making/Informed Consent{ The risks [stroke (1 in 1000), death (1 in 1000), kidney failure [usually temporary] (1 in 500), bleeding (1 in 200), allergic reaction [possibly serious] (1 in 200)], benefits (diagnostic support and management of coronary artery disease) and alternatives of a cardiac catheterization were discussed in detail with him and his wife and he is willing to proceed.  Will tentatively scheduled for cardiac cath next week at [facility].  Advised him to cut back on activity and avoid moderate to heavy exertion. If any symptoms of chest pain or shortness of breath occur at rest should go to the nearest ER or call 911.  Continue aspirin 81 mg once  daily Titrate up atorvastatin dose to 40 mg once daily.  Continue metoprolol tartrate 25 mg twice daily Continue amlodipine 10 mg once daily  Tentative follow-up in the clinic in about 2 to 3 weeks post procedure.  Will request echocardiogram to be scheduled earlier if possible.       Relevant Medications   atorvastatin (LIPITOR) 40 MG tablet   Other Visit Diagnoses     Pre-procedure lab exam    -  Primary   Relevant Orders   Basic metabolic panel   CBC         History of Present Illness:    Leroy Proctor is a 72 y.o. male who is being seen today for follow-up after his recent CT coronary angiogram results. PCP Saguier, Ramon Dredge, PA-C. Here for the visit accompanied by his wife  Has history of coronary atherosclerosis three-vessel, hemodynamically significant by CT FFR, hypertension, hyperlipidemia, renal stones, GERD, erectile dysfunction, CKD stage II, left bundle branch block morphology, frequent PVCs, former smoker quit 30 years ago, hard of hearing.  Recently evaluated for initial consult on 09-19-2023, was for symptoms of chest discomfort that led to ER visit earlier in October[facility]  Does elliptical training, swimming without any significant symptoms but does at times report chest discomfort and heaviness at times even at rest.  Will proceed with evaluation with CT coronary angiogram  Coronary artery disease abnormal CT coronary angiogram 09-21-2023 calcium score elevated 1624, CAD RADS 4 study with severe stenosis of proximal LAD, moderate stenosis in distal LAD, moderate stenosis proximal LCx, moderate stenosis of RCA,  Cardiology Consultation:    Date:  10/05/2023   ID:  Leroy Proctor, DOB Jul 07, 1951, MRN [id number]  PCP:  Esperanza Richters, PA-C  Cardiologist:  Marlyn Corporal Iysha Mishkin, MD   Referring MD: Marisue Brooklyn   No chief complaint on file.    ASSESSMENT AND PLAN:   Mr. 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   Problem List Items Addressed This Visit     CAD, triple-vessel disease with calcium score 1624, CAD RADS 4 study, severe stenosis in LAD, moderate stenosis in LCx and RCA, hemodynamically significant by CT FFR in all 3 territories    Symptoms of chest pain on exertion are less this afternoon since he has cut back on activity and being started on beta-blockers.  We reviewed the results from the CT coronary angiogram at length.  Further evaluation with cardiac catheterization and possible revascularization with stenting versus CABG reviewed.  Discussed procedure at length.  Shared Decision Making/Informed Consent{ The risks [stroke (1 in 1000), death (1 in 1000), kidney failure [usually temporary] (1 in 500), bleeding (1 in 200), allergic reaction [possibly serious] (1 in 200)], benefits (diagnostic support and management of coronary artery disease) and alternatives of a cardiac catheterization were discussed in detail with him and his wife and he is willing to proceed.  Will tentatively scheduled for cardiac cath next week at [facility].  Advised him to cut back on activity and avoid moderate to heavy exertion. If any symptoms of chest pain or shortness of breath occur at rest should go to the nearest ER or call 911.  Continue aspirin 81 mg once  daily Titrate up atorvastatin dose to 40 mg once daily.  Continue metoprolol tartrate 25 mg twice daily Continue amlodipine 10 mg once daily  Tentative follow-up in the clinic in about 2 to 3 weeks post procedure.  Will request echocardiogram to be scheduled earlier if possible.       Relevant Medications   atorvastatin (LIPITOR) 40 MG tablet   Other Visit Diagnoses     Pre-procedure lab exam    -  Primary   Relevant Orders   Basic metabolic panel   CBC         History of Present Illness:    Leroy Proctor is a 72 y.o. male who is being seen today for follow-up after his recent CT coronary angiogram results. PCP Saguier, Ramon Dredge, PA-C. Here for the visit accompanied by his wife  Has history of coronary atherosclerosis three-vessel, hemodynamically significant by CT FFR, hypertension, hyperlipidemia, renal stones, GERD, erectile dysfunction, CKD stage II, left bundle branch block morphology, frequent PVCs, former smoker quit 30 years ago, hard of hearing.  Recently evaluated for initial consult on 09-19-2023, was for symptoms of chest discomfort that led to ER visit earlier in October. [facility]  Does elliptical training, swimming without any significant symptoms but does at times report chest discomfort and heaviness at times even at rest.  Will proceed with evaluation with CT coronary angiogram  Coronary artery disease abnormal CT coronary angiogram 09-21-2023 calcium score elevated 1624, CAD RADS 4 study with severe stenosis of proximal LAD, moderate stenosis in distal LAD, moderate stenosis proximal LCx, moderate stenosis of RCA,  Cardiology Consultation:    Date:  10/05/2023   ID:  Leroy Proctor, DOB Jul 07, 1951, MRN [id number]  PCP:  Esperanza Richters, PA-C  Cardiologist:  Marlyn Corporal Iysha Mishkin, MD   Referring MD: Marisue Brooklyn   No chief complaint on file.    ASSESSMENT AND PLAN:   Mr. 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   Problem List Items Addressed This Visit     CAD, triple-vessel disease with calcium score 1624, CAD RADS 4 study, severe stenosis in LAD, moderate stenosis in LCx and RCA, hemodynamically significant by CT FFR in all 3 territories    Symptoms of chest pain on exertion are less this afternoon since he has cut back on activity and being started on beta-blockers.  We reviewed the results from the CT coronary angiogram at length.  Further evaluation with cardiac catheterization and possible revascularization with stenting versus CABG reviewed.  Discussed procedure at length.  Shared Decision Making/Informed Consent{ The risks [stroke (1 in 1000), death (1 in 1000), kidney failure [usually temporary] (1 in 500), bleeding (1 in 200), allergic reaction [possibly serious] (1 in 200)], benefits (diagnostic support and management of coronary artery disease) and alternatives of a cardiac catheterization were discussed in detail with him and his wife and he is willing to proceed.  Will tentatively scheduled for cardiac cath next week at [facility].  Advised him to cut back on activity and avoid moderate to heavy exertion. If any symptoms of chest pain or shortness of breath occur at rest should go to the nearest ER or call 911.  Continue aspirin 81 mg once  daily Titrate up atorvastatin dose to 40 mg once daily.  Continue metoprolol tartrate 25 mg twice daily Continue amlodipine 10 mg once daily  Tentative follow-up in the clinic in about 2 to 3 weeks post procedure.  Will request echocardiogram to be scheduled earlier if possible.       Relevant Medications   atorvastatin (LIPITOR) 40 MG tablet   Other Visit Diagnoses     Pre-procedure lab exam    -  Primary   Relevant Orders   Basic metabolic panel   CBC         History of Present Illness:    Leroy Proctor is a 72 y.o. male who is being seen today for follow-up after his recent CT coronary angiogram results. PCP Saguier, Ramon Dredge, PA-C. Here for the visit accompanied by his wife  Has history of coronary atherosclerosis three-vessel, hemodynamically significant by CT FFR, hypertension, hyperlipidemia, renal stones, GERD, erectile dysfunction, CKD stage II, left bundle branch block morphology, frequent PVCs, former smoker quit 30 years ago, hard of hearing.  Recently evaluated for initial consult on 09-19-2023, was for symptoms of chest discomfort that led to ER visit earlier in October. [facility]  Does elliptical training, swimming without any significant symptoms but does at times report chest discomfort and heaviness at times even at rest.  Will proceed with evaluation with CT coronary angiogram  Coronary artery disease abnormal CT coronary angiogram 09-21-2023 calcium score elevated 1624, CAD RADS 4 study with severe stenosis of proximal LAD, moderate stenosis in distal LAD, moderate stenosis proximal LCx, moderate stenosis of RCA,  Cardiology Consultation:    Date:  10/05/2023   ID:  Leroy Proctor, DOB Jul 07, 1951, MRN [id number]  PCP:  Esperanza Richters, PA-C  Cardiologist:  Marlyn Corporal Iysha Mishkin, MD   Referring MD: Marisue Brooklyn   No chief complaint on file.    ASSESSMENT AND PLAN:   Mr. 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   Problem List Items Addressed This Visit     CAD, triple-vessel disease with calcium score 1624, CAD RADS 4 study, severe stenosis in LAD, moderate stenosis in LCx and RCA, hemodynamically significant by CT FFR in all 3 territories    Symptoms of chest pain on exertion are less this afternoon since he has cut back on activity and being started on beta-blockers.  We reviewed the results from the CT coronary angiogram at length.  Further evaluation with cardiac catheterization and possible revascularization with stenting versus CABG reviewed.  Discussed procedure at length.  Shared Decision Making/Informed Consent{ The risks [stroke (1 in 1000), death (1 in 1000), kidney failure [usually temporary] (1 in 500), bleeding (1 in 200), allergic reaction [possibly serious] (1 in 200)], benefits (diagnostic support and management of coronary artery disease) and alternatives of a cardiac catheterization were discussed in detail with him and his wife and he is willing to proceed.  Will tentatively scheduled for cardiac cath next week at [facility].  Advised him to cut back on activity and avoid moderate to heavy exertion. If any symptoms of chest pain or shortness of breath occur at rest should go to the nearest ER or call 911.  Continue aspirin 81 mg once  daily Titrate up atorvastatin dose to 40 mg once daily.  Continue metoprolol tartrate 25 mg twice daily Continue amlodipine 10 mg once daily  Tentative follow-up in the clinic in about 2 to 3 weeks post procedure.  Will request echocardiogram to be scheduled earlier if possible.       Relevant Medications   atorvastatin (LIPITOR) 40 MG tablet   Other Visit Diagnoses     Pre-procedure lab exam    -  Primary   Relevant Orders   Basic metabolic panel   CBC         History of Present Illness:    Leroy Proctor is a 72 y.o. male who is being seen today for follow-up after his recent CT coronary angiogram results. PCP Saguier, Ramon Dredge, PA-C. Here for the visit accompanied by his wife  Has history of coronary atherosclerosis three-vessel, hemodynamically significant by CT FFR, hypertension, hyperlipidemia, renal stones, GERD, erectile dysfunction, CKD stage II, left bundle branch block morphology, frequent PVCs, former smoker quit 30 years ago, hard of hearing.  Recently evaluated for initial consult on 09-19-2023, was for symptoms of chest discomfort that led to ER visit earlier in October. [facility]  Does elliptical training, swimming without any significant symptoms but does at times report chest discomfort and heaviness at times even at rest.  Will proceed with evaluation with CT coronary angiogram  Coronary artery disease abnormal CT coronary angiogram 09-21-2023 calcium score elevated 1624, CAD RADS 4 study with severe stenosis of proximal LAD, moderate stenosis in distal LAD, moderate stenosis proximal LCx, moderate stenosis of RCA,

## 2023-10-05 NOTE — Patient Instructions (Signed)
Medication Instructions:  Increase Atorvastatin to 40 mg daily  Hold Cialis   *If you need a refill on your cardiac medications before your next appointment, please call your pharmacy*   Lab Work: BMET, CBC  If you have labs (blood work) drawn today and your tests are completely normal, you will receive your results only by: MyChart Message (if you have MyChart) OR A paper copy in the mail If you have any lab test that is abnormal or we need to change your treatment, we will call you to review the results.   Testing/Procedures: Your physician has requested that you have a cardiac catheterization. Cardiac catheterization is used to diagnose and/or treat various heart conditions. Doctors may recommend this procedure for a number of different reasons. The most common reason is to evaluate chest pain. Chest pain can be a symptom of coronary artery disease (CAD), and cardiac catheterization can show whether plaque is narrowing or blocking your heart's arteries. This procedure is also used to evaluate the valves, as well as measure the blood flow and oxygen levels in different parts of your heart. For further information please visit [email or web address] Please follow instruction sheet, as given.    Follow-Up: At [facility], you and your health needs are our priority.  As part of our continuing mission to provide you with exceptional heart care, we have created designated Provider Care Teams.  These Care Teams include your primary Cardiologist (physician) and Advanced Practice Providers (APPs -  Physician Assistants and Nurse Practitioners) who all work together to provide you with the care you need, when you need it.  We recommend signing up for the patient portal called "MyChart".  Sign up information is provided on this After Visit Summary.  MyChart is used to connect with patients for Virtual Visits (Telemedicine).  Patients are able to view lab/test results, encounter notes,  upcoming appointments, etc.  Non-urgent messages can be sent to your provider as well.   To learn more about what you can do with MyChart, go to [email or web address].    Your next appointment:   3 week(s)  Provider:   Huntley Dec, MD    Other Instructions  [facility] A DEPT OF [facility] [place] [address] [place], [address] [place] [address] Dept: [phone] Loc: [phone]  Leroy Proctor  10/05/2023  You are scheduled for a Cardiac Catheterization on Tuesday, November 5 with Dr. Bryan Lemma.  1. Please arrive at the [facility] (Main Entrance A) at [facility]: [address] [place], [place] [address] at 11:30 AM (This time is 2 hour(s) before your procedure to ensure your preparation). Free valet parking service is available. You will check in at ADMITTING. The support person will be asked to wait in the waiting room.  It is OK to have someone drop you off and come back when you are ready to be discharged.    Special note: Every effort is made to have your procedure done on time. Please understand that emergencies sometimes delay scheduled procedures.  2. Diet: Do not eat solid foods after midnight.  The patient may have clear liquids until 5am upon the day of the procedure.  3. Labs: You will need to have blood drawn on Friday, November 1 at [facility]: [address], [address], [place]. You do not need to be fasting.  4. On the morning of your procedure, take your Aspirin  81 mg and any morning medicines NOT listed above.  You may use sips of water.  5. Plan to go home the same day, you will only stay overnight if medically necessary. 6. Bring a current list of your medications and current insurance cards. 7. You MUST have a responsible person to drive you home. 8. Someone MUST be with you the first 24 hours after you arrive home or your discharge will be  delayed. 9. Please wear clothes that are easy to get on and off and wear slip-on shoes.  Thank you for allowing [place] to care for you!   -- [facility]

## 2023-10-05 NOTE — Assessment & Plan Note (Addendum)
Symptoms of chest pain on exertion are less this afternoon since he has cut back on activity and being started on beta-blockers.  We reviewed the results from the CT coronary angiogram at length.  Further evaluation with cardiac catheterization and possible revascularization with stenting versus CABG reviewed.  Discussed procedure at length.  Shared Decision Making/Informed Consent{ The risks [stroke (1 in 1000), death (1 in 1000), kidney failure [usually temporary] (1 in 500), bleeding (1 in 200), allergic reaction [possibly serious] (1 in 200)], benefits (diagnostic support and management of coronary artery disease) and alternatives of a cardiac catheterization were discussed in detail with him and his wife and he is willing to proceed.  Will tentatively scheduled for cardiac cath next week at [facility].  Advised him to cut back on activity and avoid moderate to heavy exertion. If any symptoms of chest pain or shortness of breath occur at rest should go to the nearest ER or call 911.  Continue aspirin 81 mg once daily Titrate up atorvastatin dose to 40 mg once daily.  Continue metoprolol tartrate 25 mg twice daily Continue amlodipine 10 mg once daily  Tentative follow-up in the clinic in about 2 to 3 weeks post procedure.  Will request echocardiogram to be scheduled earlier if possible.

## 2023-10-06 LAB — BASIC METABOLIC PANEL
BUN/Creatinine Ratio: 22 (ref 10–24)
BUN: 20 mg/dL (ref 8–27)
CO2: 26 mmol/L (ref 20–29)
Calcium: 9 mg/dL (ref 8.6–10.2)
Chloride: 101 mmol/L (ref 96–106)
Creatinine, Ser: 0.9 mg/dL (ref 0.76–1.27)
Glucose: 87 mg/dL (ref 70–99)
Potassium: 4.7 mmol/L (ref 3.5–5.2)
Sodium: 140 mmol/L (ref 134–144)
eGFR: 91 mL/min/{1.73_m2} (ref >59)

## 2023-10-06 LAB — CBC
Hematocrit: 42 % (ref 37.5–51.0)
Hemoglobin: 14.1 g/dL (ref 13.0–17.7)
MCH: 30.1 pg (ref 26.6–33.0)
MCHC: 33.6 g/dL (ref 31.5–35.7)
MCV: 90 fL (ref 79–97)
Platelets: 257 10*3/uL (ref 150–450)
RBC: 4.69 x10E6/uL (ref 4.14–5.80)
RDW: 12.8 % (ref 11.6–15.4)
WBC: 6.3 10*3/uL (ref 3.4–10.8)

## 2023-10-09 ENCOUNTER — Telehealth: Payer: Self-pay | Admitting: *Deleted

## 2023-10-09 NOTE — Telephone Encounter (Addendum)
Cardiac Catheterization scheduled at [facility] for: Tuesday October 10, 2023 1:30 PM Arrival time [facility] Main Entrance A at: 11:30 AM  Nothing to eat after midnight prior to procedure, clear liquids until 5 AM day of procedure.  Medication instructions: -Usual morning medications can be taken with sips of water including aspirin 81 mg.  Plan to go home the same day, you will only stay overnight if medically necessary.  You must have responsible adult to drive you home.  Someone must be with you the first 24 hours after you arrive home.  Reviewed procedure instructions with patient.

## 2023-10-10 ENCOUNTER — Encounter (HOSPITAL_COMMUNITY): Payer: Self-pay | Admitting: Cardiology

## 2023-10-10 ENCOUNTER — Inpatient Hospital Stay (HOSPITAL_COMMUNITY)
Admission: AD | Disposition: A | Discharge status: Home / Self Care | Payer: Self-pay | Source: Home / Self Care | Attending: Thoracic Surgery (Cardiothoracic Vascular Surgery)

## 2023-10-10 ENCOUNTER — Inpatient Hospital Stay (HOSPITAL_COMMUNITY)
Admission: AD | Admit: 2023-10-10 | Discharge: 2023-10-18 | DRG: 234 | Disposition: A | Discharge status: Home / Self Care | Payer: Medicare PPO | Attending: Thoracic Surgery (Cardiothoracic Vascular Surgery) | Admitting: Thoracic Surgery (Cardiothoracic Vascular Surgery)

## 2023-10-10 ENCOUNTER — Other Ambulatory Visit: Payer: Self-pay

## 2023-10-10 DIAGNOSIS — I25118 Atherosclerotic heart disease of native coronary artery with other forms of angina pectoris: Secondary | ICD-10-CM | POA: Diagnosis not present

## 2023-10-10 DIAGNOSIS — Z978 Presence of other specified devices: Secondary | ICD-10-CM | POA: Diagnosis not present

## 2023-10-10 DIAGNOSIS — Z7982 Long term (current) use of aspirin: Secondary | ICD-10-CM | POA: Diagnosis not present

## 2023-10-10 DIAGNOSIS — I2510 Atherosclerotic heart disease of native coronary artery without angina pectoris: Secondary | ICD-10-CM

## 2023-10-10 DIAGNOSIS — Z1152 Encounter for screening for COVID-19: Secondary | ICD-10-CM | POA: Diagnosis not present

## 2023-10-10 DIAGNOSIS — R001 Bradycardia, unspecified: Secondary | ICD-10-CM | POA: Diagnosis present

## 2023-10-10 DIAGNOSIS — K219 Gastro-esophageal reflux disease without esophagitis: Secondary | ICD-10-CM | POA: Diagnosis present

## 2023-10-10 DIAGNOSIS — Z791 Long term (current) use of non-steroidal anti-inflammatories (NSAID): Secondary | ICD-10-CM | POA: Diagnosis not present

## 2023-10-10 DIAGNOSIS — I10 Essential (primary) hypertension: Secondary | ICD-10-CM | POA: Diagnosis not present

## 2023-10-10 DIAGNOSIS — Z01810 Encounter for preprocedural cardiovascular examination: Secondary | ICD-10-CM | POA: Diagnosis not present

## 2023-10-10 DIAGNOSIS — Q250 Patent ductus arteriosus: Secondary | ICD-10-CM

## 2023-10-10 DIAGNOSIS — I4892 Unspecified atrial flutter: Secondary | ICD-10-CM | POA: Diagnosis not present

## 2023-10-10 DIAGNOSIS — Z48812 Encounter for surgical aftercare following surgery on the circulatory system: Secondary | ICD-10-CM | POA: Diagnosis not present

## 2023-10-10 DIAGNOSIS — Z822 Family history of deafness and hearing loss: Secondary | ICD-10-CM

## 2023-10-10 DIAGNOSIS — Z951 Presence of aortocoronary bypass graft: Secondary | ICD-10-CM

## 2023-10-10 DIAGNOSIS — Z9889 Other specified postprocedural states: Secondary | ICD-10-CM | POA: Diagnosis not present

## 2023-10-10 DIAGNOSIS — I200 Unstable angina: Principal | ICD-10-CM | POA: Diagnosis present

## 2023-10-10 DIAGNOSIS — Z833 Family history of diabetes mellitus: Secondary | ICD-10-CM

## 2023-10-10 DIAGNOSIS — Z888 Allergy status to other drugs, medicaments and biological substances status: Secondary | ICD-10-CM | POA: Diagnosis not present

## 2023-10-10 DIAGNOSIS — Z885 Allergy status to narcotic agent status: Secondary | ICD-10-CM | POA: Diagnosis not present

## 2023-10-10 DIAGNOSIS — D62 Acute posthemorrhagic anemia: Secondary | ICD-10-CM

## 2023-10-10 DIAGNOSIS — I25110 Atherosclerotic heart disease of native coronary artery with unstable angina pectoris: Principal | ICD-10-CM | POA: Diagnosis present

## 2023-10-10 DIAGNOSIS — Z87891 Personal history of nicotine dependence: Secondary | ICD-10-CM

## 2023-10-10 DIAGNOSIS — I4891 Unspecified atrial fibrillation: Secondary | ICD-10-CM | POA: Diagnosis not present

## 2023-10-10 DIAGNOSIS — Z87442 Personal history of urinary calculi: Secondary | ICD-10-CM

## 2023-10-10 DIAGNOSIS — E785 Hyperlipidemia, unspecified: Secondary | ICD-10-CM | POA: Diagnosis present

## 2023-10-10 DIAGNOSIS — N182 Chronic kidney disease, stage 2 (mild): Secondary | ICD-10-CM | POA: Diagnosis present

## 2023-10-10 DIAGNOSIS — I129 Hypertensive chronic kidney disease with stage 1 through stage 4 chronic kidney disease, or unspecified chronic kidney disease: Secondary | ICD-10-CM | POA: Diagnosis not present

## 2023-10-10 DIAGNOSIS — I340 Nonrheumatic mitral (valve) insufficiency: Secondary | ICD-10-CM | POA: Diagnosis present

## 2023-10-10 DIAGNOSIS — R0989 Other specified symptoms and signs involving the circulatory and respiratory systems: Secondary | ICD-10-CM | POA: Diagnosis not present

## 2023-10-10 DIAGNOSIS — R7303 Prediabetes: Secondary | ICD-10-CM | POA: Diagnosis not present

## 2023-10-10 DIAGNOSIS — Z79899 Other long term (current) drug therapy: Secondary | ICD-10-CM

## 2023-10-10 DIAGNOSIS — Z01812 Encounter for preprocedural laboratory examination: Secondary | ICD-10-CM

## 2023-10-10 DIAGNOSIS — I2584 Coronary atherosclerosis due to calcified coronary lesion: Secondary | ICD-10-CM | POA: Diagnosis present

## 2023-10-10 DIAGNOSIS — J9811 Atelectasis: Secondary | ICD-10-CM | POA: Diagnosis not present

## 2023-10-10 DIAGNOSIS — I5020 Unspecified systolic (congestive) heart failure: Secondary | ICD-10-CM | POA: Diagnosis not present

## 2023-10-10 DIAGNOSIS — I447 Left bundle-branch block, unspecified: Secondary | ICD-10-CM | POA: Diagnosis present

## 2023-10-10 DIAGNOSIS — E7800 Pure hypercholesterolemia, unspecified: Secondary | ICD-10-CM | POA: Diagnosis not present

## 2023-10-10 DIAGNOSIS — I130 Hypertensive heart and chronic kidney disease with heart failure and stage 1 through stage 4 chronic kidney disease, or unspecified chronic kidney disease: Secondary | ICD-10-CM | POA: Diagnosis present

## 2023-10-10 DIAGNOSIS — Z4682 Encounter for fitting and adjustment of non-vascular catheter: Secondary | ICD-10-CM | POA: Diagnosis not present

## 2023-10-10 DIAGNOSIS — R110 Nausea: Secondary | ICD-10-CM | POA: Diagnosis not present

## 2023-10-10 DIAGNOSIS — R918 Other nonspecific abnormal finding of lung field: Secondary | ICD-10-CM | POA: Diagnosis not present

## 2023-10-10 HISTORY — DX: Unstable angina: I20.0

## 2023-10-10 HISTORY — PX: LEFT HEART CATH AND CORONARY ANGIOGRAPHY: CATH118249

## 2023-10-10 HISTORY — DX: Personal history of urinary calculi: Z87.442

## 2023-10-10 SURGERY — LEFT HEART CATH AND CORONARY ANGIOGRAPHY
Anesthesia: LOCAL

## 2023-10-10 MED ORDER — HEPARIN (PORCINE) IN NACL 1000-0.9 UT/500ML-% IV SOLN
INTRAVENOUS | Status: DC | PRN
  Administered 2023-10-10: 1000 mL

## 2023-10-10 MED ORDER — VERAPAMIL HCL 2.5 MG/ML IV SOLN
INTRAVENOUS | Status: DC | PRN
  Administered 2023-10-10: 10 mL via INTRA_ARTERIAL

## 2023-10-10 MED ORDER — AMLODIPINE BESYLATE 10 MG PO TABS
10.0000 mg | ORAL_TABLET | Freq: Every day | ORAL | Status: DC
  Administered 2023-10-11: 10 mg via ORAL
  Filled 2023-10-10: qty 1

## 2023-10-10 MED ORDER — FENTANYL CITRATE (PF) 100 MCG/2ML IJ SOLN
INTRAMUSCULAR | Status: DC | PRN
  Administered 2023-10-10: 25 ug via INTRAVENOUS

## 2023-10-10 MED ORDER — PANTOPRAZOLE SODIUM 40 MG PO TBEC
40.0000 mg | DELAYED_RELEASE_TABLET | Freq: Every day | ORAL | Status: DC
  Administered 2023-10-11: 40 mg via ORAL
  Filled 2023-10-10: qty 1

## 2023-10-10 MED ORDER — HEPARIN (PORCINE) 25000 UT/250ML-% IV SOLN
1450.0000 [IU]/h | INTRAVENOUS | Status: DC
Start: 2023-10-10 — End: 2023-10-12
  Administered 2023-10-10: 1250 [IU]/h via INTRAVENOUS
  Administered 2023-10-11: 1450 [IU]/h via INTRAVENOUS
  Filled 2023-10-10 (×2): qty 250

## 2023-10-10 MED ORDER — SODIUM CHLORIDE 0.9 % WEIGHT BASED INFUSION
3.0000 mL/kg/h | INTRAVENOUS | Status: DC
  Administered 2023-10-10: 3 mL/kg/h via INTRAVENOUS

## 2023-10-10 MED ORDER — ONDANSETRON HCL 4 MG/2ML IJ SOLN
4.0000 mg | Freq: Four times a day (QID) | INTRAMUSCULAR | Status: DC | PRN
Start: 2023-10-10 — End: 2023-10-12

## 2023-10-10 MED ORDER — SODIUM CHLORIDE 0.9 % IV SOLN: 250.0000 mL | INTRAVENOUS | Status: AC | PRN

## 2023-10-10 MED ORDER — LIDOCAINE HCL (PF) 1 % IJ SOLN
INTRAMUSCULAR | Status: AC
  Filled 2023-10-10: qty 30

## 2023-10-10 MED ORDER — MIDAZOLAM HCL 2 MG/2ML IJ SOLN
INTRAMUSCULAR | Status: DC | PRN
  Administered 2023-10-10: 1 mg via INTRAVENOUS

## 2023-10-10 MED ORDER — METOPROLOL TARTRATE 25 MG PO TABS
25.0000 mg | ORAL_TABLET | Freq: Two times a day (BID) | ORAL | Status: DC
  Administered 2023-10-10 – 2023-10-11 (×3): 25 mg via ORAL
  Filled 2023-10-10 (×3): qty 1

## 2023-10-10 MED ORDER — ASPIRIN 81 MG PO TBEC
81.0000 mg | DELAYED_RELEASE_TABLET | Freq: Every day | ORAL | Status: DC
  Administered 2023-10-11: 81 mg via ORAL
  Filled 2023-10-10: qty 1

## 2023-10-10 MED ORDER — SODIUM CHLORIDE 0.9% FLUSH
3.0000 mL | INTRAVENOUS | Status: DC | PRN
Start: 2023-10-10 — End: 2023-10-12

## 2023-10-10 MED ORDER — IOHEXOL 350 MG/ML SOLN
INTRAVENOUS | Status: DC | PRN
  Administered 2023-10-10: 50 mL

## 2023-10-10 MED ORDER — ACETAMINOPHEN 325 MG PO TABS
650.0000 mg | ORAL_TABLET | ORAL | Status: DC | PRN
Start: 2023-10-10 — End: 2023-10-12

## 2023-10-10 MED ORDER — HEPARIN SODIUM (PORCINE) 1000 UNIT/ML IJ SOLN
INTRAMUSCULAR | Status: DC | PRN
  Administered 2023-10-10: 4500 [IU] via INTRAVENOUS

## 2023-10-10 MED ORDER — SODIUM CHLORIDE 0.9% FLUSH: 3.0000 mL | Freq: Two times a day (BID) | INTRAVENOUS | Status: DC

## 2023-10-10 MED ORDER — HEPARIN SODIUM (PORCINE) 1000 UNIT/ML IJ SOLN
INTRAMUSCULAR | Status: AC
  Filled 2023-10-10: qty 10

## 2023-10-10 MED ORDER — ATORVASTATIN CALCIUM 40 MG PO TABS
40.0000 mg | ORAL_TABLET | Freq: Every day | ORAL | Status: DC
  Administered 2023-10-11: 40 mg via ORAL
  Filled 2023-10-10: qty 1

## 2023-10-10 MED ORDER — SODIUM CHLORIDE 0.9 % IV SOLN: INTRAVENOUS | Status: AC

## 2023-10-10 MED ORDER — VERAPAMIL HCL 2.5 MG/ML IV SOLN
INTRAVENOUS | Status: AC
  Filled 2023-10-10: qty 2

## 2023-10-10 MED ORDER — ONDANSETRON HCL 4 MG/2ML IJ SOLN
INTRAMUSCULAR | Status: DC | PRN
  Administered 2023-10-10: 4 mg via INTRAVENOUS

## 2023-10-10 MED ORDER — MIDAZOLAM HCL 2 MG/2ML IJ SOLN
INTRAMUSCULAR | Status: AC
  Filled 2023-10-10: qty 2

## 2023-10-10 MED ORDER — HYDRALAZINE HCL 20 MG/ML IJ SOLN
10.0000 mg | INTRAMUSCULAR | Status: AC | PRN
Start: 2023-10-10 — End: 2023-10-10

## 2023-10-10 MED ORDER — SODIUM CHLORIDE 0.9 % WEIGHT BASED INFUSION: 1.0000 mL/kg/h | INTRAVENOUS | Status: DC

## 2023-10-10 MED ORDER — FENTANYL CITRATE (PF) 100 MCG/2ML IJ SOLN
INTRAMUSCULAR | Status: AC
  Filled 2023-10-10: qty 2

## 2023-10-10 SURGICAL SUPPLY — 8 items
CATH INFINITI 5FR ANG PIGTAIL (CATHETERS) ×1 IMPLANT
CATH INFINITI AMBI 5FR TG (CATHETERS) ×1 IMPLANT
DEVICE RAD COMP TR BAND LRG (VASCULAR PRODUCTS) ×1 IMPLANT
GLIDESHEATH SLEND SS 6F .021 (SHEATH) ×1 IMPLANT
GUIDEWIRE INQWIRE 1.5J.035X260 (WIRE) IMPLANT
INQWIRE 1.5J .035X260CM (WIRE) ×1
PACK CARDIAC CATHETERIZATION (CUSTOM PROCEDURE TRAY) ×2 IMPLANT
SET ATX-X65L (MISCELLANEOUS) ×1 IMPLANT

## 2023-10-10 NOTE — Consult Note (Cosign Needed)
[address]       [place] [address]             [id number]        [name] [facility] Medical Record #[id number] Date of Birth: 06-21-1951  Referring: Marykay Lex, MD Primary Care: Marisue Brooklyn Primary Cardiologist:None  Chief Complaint:   Severe three-vessel coronary artery disease   History of Present Illness:    We are asked to see this 72 year old male in CT surgical consultation for consideration of coronary artery surgical revascularization.  The patient has multiple cardiac risk factors including hypertension,  hyperlipidemia and history of tobacco abuse.  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  The patient had an abnormal CT coronary angiogram in October of this year following a visit to the emergency room where he presented with chest discomfort.  He reports that he does workout on a regular basis at the [facility] without significant symptoms normally, however does have heaviness in his chest at times even at rest.  He has noticed recently during workouts that his blood pressure has been much lower than normal for him.  The CTA showed hemodynamically significant findings by FFR.  He had a significantly elevated calcium score of 1624 which was 90th centile for age.  It was felt to be consistent with four-vessel significant stenosis.  Due to these findings he was felt to require cardiac catheterization which was done today and he was found to have confirmation of the three-vessel severe disease.  Echocardiogram is pending.    Current Activity/ Functional Status: Patient is independent with mobility/ambulation, transfers, ADL's, IADL's.   Zubrod Score: At the time of surgery this patient's most appropriate activity status/level should be described as: []     0    Normal activity, no symptoms []     1    Restricted in physical strenuous activity but ambulatory,  able to do out light work []     2    Ambulatory and capable of self care, unable to do work activities, up and about                 more than 50%  Of the time                            []     3    Only limited self care, in bed greater than 50% of waking hours []     4    Completely disabled, no self care, confined to bed or chair []     5    Moribund  Past Medical History:  Diagnosis Date   Allergy    Arthritis    Bradycardia 10/02/2013   Cataract    Chronic renal insufficiency, stage II (mild)    CrCl 60s   Cold sore 06/03/2014   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   Erectile dysfunction    GERD (gastroesophageal reflux disease)    Health maintenance examination 10/16/2014   Herpes zoster    R side of face   HTN (hypertension) 11/21/2011   Hyperlipidemia    Impaired fasting glucose 2013-2015   Kidney stones    Personal history of kidney stones    Recurrent herpes labialis    Routine general medical examination at a health care facility 10/02/2013    Past Surgical History:  Procedure Laterality Date   COLONOSCOPY  2002; 2014   Repeat 2024   LITHOTRIPSY      Social History   Tobacco Use  Smoking Status Former   Current packs/day: 0.50   Average packs/day: 0.5 packs/day for 15.0 years (7.5 ttl pk-yrs)   Types: Cigarettes  Smokeless Tobacco Never    Social History   Substance and Sexual Activity  Alcohol Use Yes   Alcohol/week: 13.0 standard drinks of alcohol   Types: 1 Glasses of wine, 12 Cans of beer per week   Comment: drink beer     Allergies  Allergen Reactions   Atenolol     bradycarda- heart rate down to 38 bpm   Demerol Nausea And Vomiting    Current Facility-Administered Medications  Medication Dose Route Frequency Provider Last Rate Last Admin   0.9% sodium chloride infusion  1 mL/kg/hr Intravenous Continuous Madireddy, Marlyn Corporal, MD 90.7 mL/hr at 10/10/23 1033 1 mL/kg/hr at 10/10/23 1033    fentaNYL (SUBLIMAZE) injection    PRN Marykay Lex, MD   25 mcg at 10/10/23 1429   Heparin (Porcine) in NaCl 1000-0.9 UT/500ML-% SOLN    PRN Marykay Lex, MD   1,000 mL at 10/10/23 1431   heparin sodium (porcine) injection    PRN Marykay Lex, MD   4,500 Units at 10/10/23 1432   hydrALAZINE (APRESOLINE) injection 10 mg  10 mg Intravenous Q20 Min PRN Marykay Lex, MD       iohexol (OMNIPAQUE) 350 MG/ML injection    PRN Marykay Lex, MD   50 mL at 10/10/23 1453   midazolam (VERSED) injection    PRN Marykay Lex, MD   1 mg at 10/10/23 1429   ondansetron (ZOFRAN) injection    PRN Marykay Lex, MD   4 mg at 10/10/23 1429   Radial Cocktail/Verapamil only    PRN Marykay Lex, MD   10 mL at 10/10/23 1430    Medications Prior to Admission  Medication Sig Dispense Refill Last Dose   amLODipine (NORVASC) 10 MG tablet Take 1 tablet (10 mg total) by mouth daily. 90 tablet 3 10/10/2023   aspirin EC 81 MG tablet Take 1 tablet (81 mg total) by mouth daily. Swallow whole. 90 tablet 3 10/10/2023 at 0730   atorvastatin (LIPITOR) 40 MG tablet Take 1 tablet (40 mg total) by mouth daily. 90 tablet 3 10/10/2023   meloxicam (MOBIC) 15 MG tablet Take 15 mg by mouth daily.   10/10/2023   metoprolol tartrate (LOPRESSOR) 25 MG tablet Take 1 tablet (25 mg total) by mouth 2 (two) times daily. 180 tablet 3 10/10/2023   omeprazole (PRILOSEC) 20 MG capsule TAKE 1 CAPSULE BY MOUTH EVERY MORNING 30 MINUTES BEFORE BREAKFAST 90 capsule 3 10/10/2023   tadalafil (CIALIS) 20 MG tablet TAKE 1/2 TO 1 TABLET BY MOUTH EVERY OTHER DAY AS NEEDED FOR ERECTILE DYSFUNCTION 10 tablet 1 Past Week    Family History  Problem Relation Age of Onset   Hearing loss Mother    Diabetes Father    Colon cancer Neg Hx    Stomach cancer Neg Hx    Esophageal cancer Neg Hx      Review of Systems:   Review of Systems  Constitutional:  Positive for malaise/fatigue.       Decreased blood pressure which was unusual for  him, systolic in 90's  Eyes:  Positive for blurred vision.  Respiratory:  Positive for shortness of breath.   Cardiovascular:  Positive for chest pain, palpitations and leg swelling.  Gastrointestinal:  Positive for heartburn.  Genitourinary:        Remote kidney stones  Neurological:  Positive for tremors.  Endo/Heme/Allergies:  Bruises/bleeds easily.  Psychiatric/Behavioral:  The patient is nervous/anxious.        He has been very anxious recently due to his wife's illness          Physical Exam: BP 139/73   Pulse (!) 49   Temp 97.8 F (36.6 C) (Temporal)   Resp 10   Ht 5\' 8"  (1.727 m)   Wt 88.5 kg   SpO2 92%   BMI 29.65 kg/m    General appearance: alert, cooperative, appears stated age, and no distress Head: Normocephalic, without obvious abnormality, atraumatic Neck: no adenopathy, no JVD, supple, symmetrical, trachea midline, thyroid not enlarged, symmetric, no tenderness/mass/nodules, and left carotid bruit Lymph nodes: Cervical, supraclavicular, and axillary nodes normal. Resp: clear to auscultation bilaterally Back: symmetric, no curvature. ROM normal. No CVA tenderness. Cardio: Distant heart sounds, regular rate and rhythm, bradycardic, no definite murmur appreciated, palpable pedal pulses GI: soft, non-tender; bowel sounds normal; no masses,  no organomegaly Extremities: Minor ankle edema Neurologic: Grossly normal  Diagnostic Studies & Laboratory data:     Recent Radiology Findings:   CARDIAC CATHETERIZATION  Result Date: 10/10/2023   Prox RCA lesion is 50% stenosed.   Mid RCA-1 lesion is 80% stenosed. Mid RCA-2 lesion is 50% stenosed.   Dist RCA lesion is 80% stenosed.   Prox LAD to Mid LAD lesion is 99% stenosed-followed by poststenotic dilatation, then Mid LAD-2 lesion is 70% stenosed.   Dist LAD lesion is 50% stenosed.   Mid Cx to Dist Cx lesion is 90% stenosed.   The left ventricular systolic function is normal.   LV end diastolic pressure is normal.   The  left ventricular ejection fraction is 45-50% by visual estimate.   There is no aortic valve stenosis.   Anticipated discharge date to be determined.   Recommend Aspirin 81mg  daily for moderate CAD. Severe multivessel CAD involving extensive disease lesions throughout the RCA, 2 focal lesions 1 numb 99% in the very early mid LAD as well as severe 90% stenosis of the of the LCx. Normal LVEDP with preserved LVEF. RECOMMENDATION  Patient will be admitted to stepdown/progressive care unit for ongoing management based on him having resting chest pain concerning for progressive angina. With three-vessel disease, we will start IV heparin 2 hours after sheath removal. Not on Thienopyridine. Check 2D echo and consult CVTS Restart most home medications. Bryan Lemma, MD    I have independently reviewed the above radiologic studies and discussed with the patient   Recent Lab Findings: Lab Results  Component Value Date   WBC 6.3 10/05/2023   HGB 14.1 10/05/2023   HCT 42.0 10/05/2023   PLT 257 10/05/2023   GLUCOSE 87 10/05/2023   CHOL 168 04/19/2022   TRIG 96.0 04/19/2022   HDL 49.80 04/19/2022   LDLCALC 99 04/19/2022   ALT 14 09/04/2023   AST 15 09/04/2023   NA 140 10/05/2023   K 4.7 10/05/2023   CL 101 10/05/2023   CREATININE 0.90 10/05/2023   BUN 20 10/05/2023   CO2 26 10/05/2023   TSH 0.76 11/03/2016   HGBA1C 5.7 04/19/2022      Assessment / Plan: Severe three-vessel coronary artery disease with progressive angina. Prior tobacco abuse, quit for good approximately age 10 Remote history of kidney stones Reported history of CKD  stage II, however evaluating renal function over past several years BUN and creatinine have been in the normal range. History of hypertension History of hyperlipidemia Normal hemoglobin A1c Bradycardia with left bundle branch block   Patient appears to be a good candidate for CABG.  The surgeon will evaluate the patient and all relevant studies to determine  timing.  I  spent 40 minutes counseling the patient face to face.   Rowe Clack, PA-C  10/10/2023 3:33 PM  72yo male admitted following an elective left heart cath.  He is noted to have 3V CAD.  Echo is pending.  Formal recs following review of echo.  Harrell Keane Scrape

## 2023-10-10 NOTE — Interval H&P Note (Signed)
History and Physical Interval Note:  10/10/2023 2:21 PM  [name]  has presented today for surgery, with the diagnosis of angina - abnormal ct.  The various methods of treatment have been discussed with the patient and family. After consideration of risks, benefits and other options for treatment, the patient has consented to  Procedure(s): LEFT HEART CATH AND CORONARY ANGIOGRAPHY (N/A)  PERCUTANEOUS CORONARY INTERVENTION  Cath Lab Visit (complete for each Cath Lab visit)  Clinical Evaluation Leading to the Procedure:   ACS: No.  Non-ACS:    Anginal Classification: CCS III  Anti-ischemic medical therapy: Maximal Therapy (2 or more classes of medications)  Non-Invasive Test Results: High-risk stress test findings: cardiac mortality >3%/year  Prior CABG: No previous CABG   as a surgical intervention.  The patient's history has been reviewed, patient examined, no change in status, stable for surgery.  I have reviewed the patient's chart and labs.  Questions were answered to the patient's satisfaction.     Bryan Lemma

## 2023-10-10 NOTE — Progress Notes (Signed)
PHARMACY - ANTICOAGULATION CONSULT NOTE  Pharmacy Consult for heparin Indication: Severe multivessel CAD   Allergies  Allergen Reactions   Atenolol     bradycarda- heart rate down to 38 bpm   Demerol Nausea And Vomiting    Patient Measurements: Height: 5\' 8"  (172.7 cm) Weight: 88.5 kg (195 lb) IBW/kg (Calculated) : 68.4 Heparin Dosing Weight: 86 kg  Vital Signs: Temp: 97.4 F (36.3 C) (11/05 1700) Temp Source: Oral (11/05 1700) BP: 167/71 (11/05 1700) Pulse Rate: 51 (11/05 1700)  Labs: No results for input(s): "HGB", "HCT", "PLT", "APTT", "LABPROT", "INR", "HEPARINUNFRC", "HEPRLOWMOCWT", "CREATININE", "CKTOTAL", "CKMB", "TROPONINIHS" in the last 72 hours.  Estimated Creatinine Clearance: 80.2 mL/min (by C-G formula based on SCr of 0.9 mg/dL).   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 No anticoagulation prior to admission. Pharmacy consulted for heparin.    Per RN, anticipate TR band removal at 1900. Pt received 4500 units heparin in cath.   Goal of Therapy:  Heparin level 0.3-0.7 units/ml Monitor platelets by anticoagulation protocol: Yes   Plan:  Start heparin 1250 units/hr at 2100 Monitor daily heparin level, CBC,  signs/symptoms of bleeding  Fu CABG consult  Alphia Moh, PharmD, BCPS, BCCP Clinical Pharmacist  Please check AMION for all [facility] Pharmacy phone numbers After 10:00 PM, call Main Pharmacy [phone]

## 2023-10-10 NOTE — Plan of Care (Signed)

## 2023-10-11 ENCOUNTER — Encounter (HOSPITAL_COMMUNITY): Payer: Self-pay | Admitting: Cardiology

## 2023-10-11 ENCOUNTER — Inpatient Hospital Stay (HOSPITAL_COMMUNITY): Payer: Medicare PPO

## 2023-10-11 DIAGNOSIS — I2510 Atherosclerotic heart disease of native coronary artery without angina pectoris: Secondary | ICD-10-CM

## 2023-10-11 DIAGNOSIS — Z01810 Encounter for preprocedural cardiovascular examination: Secondary | ICD-10-CM | POA: Diagnosis not present

## 2023-10-11 DIAGNOSIS — R7303 Prediabetes: Secondary | ICD-10-CM

## 2023-10-11 DIAGNOSIS — I10 Essential (primary) hypertension: Secondary | ICD-10-CM | POA: Diagnosis not present

## 2023-10-11 DIAGNOSIS — E7800 Pure hypercholesterolemia, unspecified: Secondary | ICD-10-CM

## 2023-10-11 DIAGNOSIS — I25118 Atherosclerotic heart disease of native coronary artery with other forms of angina pectoris: Secondary | ICD-10-CM | POA: Diagnosis not present

## 2023-10-11 HISTORY — DX: Prediabetes: R73.03

## 2023-10-11 HISTORY — DX: Pure hypercholesterolemia, unspecified: E78.00

## 2023-10-11 LAB — CBC
HCT: 38.6 % — ABNORMAL LOW (ref 39.0–52.0)
Hemoglobin: 12.7 g/dL — ABNORMAL LOW (ref 13.0–17.0)
MCH: 28.5 pg (ref 26.0–34.0)
MCHC: 32.9 g/dL (ref 30.0–36.0)
MCV: 86.5 fL (ref 80.0–100.0)
Platelets: 227 10*3/uL (ref 150–400)
RBC: 4.46 MIL/uL (ref 4.22–5.81)
RDW: 12.8 % (ref 11.5–15.5)
WBC: 6.1 10*3/uL (ref 4.0–10.5)
nRBC: 0 % (ref 0.0–0.2)

## 2023-10-11 LAB — ECHOCARDIOGRAM COMPLETE
Area-P 1/2: 3.46 cm2
Height: 68 in
S' Lateral: 3.2 cm
Weight: 3120 [oz_av]

## 2023-10-11 LAB — HEPARIN LEVEL (UNFRACTIONATED)
Heparin Unfractionated: 0.2 [IU]/mL — ABNORMAL LOW (ref 0.30–0.70)
Heparin Unfractionated: 0.5 [IU]/mL (ref 0.30–0.70)

## 2023-10-11 LAB — PREPARE RBC (CROSSMATCH)

## 2023-10-11 LAB — APTT: aPTT: 97 s — ABNORMAL HIGH (ref 24–36)

## 2023-10-11 LAB — LIPID PANEL
Cholesterol: 149 mg/dL (ref 0–200)
HDL: 48 mg/dL (ref >40)
LDL Cholesterol: 93 mg/dL (ref 0–99)
Total CHOL/HDL Ratio: 3.1 {ratio}
Triglycerides: 38 mg/dL (ref <150)
VLDL: 8 mg/dL (ref 0–40)

## 2023-10-11 LAB — HEMOGLOBIN A1C
Hgb A1c MFr Bld: 5.5 % (ref 4.8–5.6)
Mean Plasma Glucose: 111.15 mg/dL

## 2023-10-11 LAB — PROTIME-INR
INR: 1.1 (ref 0.8–1.2)
Prothrombin Time: 14.5 s (ref 11.4–15.2)

## 2023-10-11 LAB — SARS CORONAVIRUS 2 BY RT PCR: SARS Coronavirus 2 by RT PCR: NEGATIVE

## 2023-10-11 LAB — SURGICAL PCR SCREEN
MRSA, PCR: NEGATIVE
Staphylococcus aureus: NEGATIVE

## 2023-10-11 LAB — ABO/RH: ABO/RH(D): O NEG

## 2023-10-11 MED ORDER — NOREPINEPHRINE 4 MG/250ML-% IV SOLN
0.0000 ug/min | INTRAVENOUS | Status: DC
  Filled 2023-10-11: qty 250

## 2023-10-11 MED ORDER — PLASMA-LYTE A IV SOLN
INTRAVENOUS | Status: DC
  Filled 2023-10-11: qty 2.5

## 2023-10-11 MED ORDER — MANNITOL 20 % IV SOLN
INTRAVENOUS | Status: DC
  Filled 2023-10-11: qty 13

## 2023-10-11 MED ORDER — INSULIN REGULAR(HUMAN) IN NACL 100-0.9 UT/100ML-% IV SOLN
INTRAVENOUS | Status: AC
  Administered 2023-10-12: 1.6 [IU]/h via INTRAVENOUS
  Filled 2023-10-11: qty 100

## 2023-10-11 MED ORDER — PHENYLEPHRINE HCL-NACL 20-0.9 MG/250ML-% IV SOLN
30.0000 ug/min | INTRAVENOUS | Status: DC
  Filled 2023-10-11: qty 250

## 2023-10-11 MED ORDER — NITROGLYCERIN IN D5W 200-5 MCG/ML-% IV SOLN
2.0000 ug/min | INTRAVENOUS | Status: AC
  Administered 2023-10-12: 20 ug/min via INTRAVENOUS
  Filled 2023-10-11: qty 250

## 2023-10-11 MED ORDER — HEPARIN 30,000 UNITS/1000 ML (OHS) CELLSAVER SOLUTION
Status: DC
  Filled 2023-10-11: qty 1000

## 2023-10-11 MED ORDER — VANCOMYCIN HCL 1500 MG/300ML IV SOLN
1500.0000 mg | INTRAVENOUS | Status: AC
  Administered 2023-10-12: 1500 mg via INTRAVENOUS
  Filled 2023-10-11: qty 300

## 2023-10-11 MED ORDER — PERFLUTREN LIPID MICROSPHERE
1.0000 mL | INTRAVENOUS | Status: AC | PRN
  Administered 2023-10-11: 6 mL via INTRAVENOUS

## 2023-10-11 MED ORDER — BISACODYL 5 MG PO TBEC
5.0000 mg | DELAYED_RELEASE_TABLET | Freq: Once | ORAL | Status: AC
  Administered 2023-10-11: 5 mg via ORAL
  Filled 2023-10-11: qty 1

## 2023-10-11 MED ORDER — EPINEPHRINE HCL 5 MG/250ML IV SOLN IN NS
0.0000 ug/min | INTRAVENOUS | Status: DC
  Filled 2023-10-11: qty 250

## 2023-10-11 MED ORDER — DEXMEDETOMIDINE HCL IN NACL 400 MCG/100ML IV SOLN
0.1000 ug/kg/h | INTRAVENOUS | Status: AC
  Administered 2023-10-12: .5 ug/kg/h via INTRAVENOUS
  Filled 2023-10-11: qty 100

## 2023-10-11 MED ORDER — TRANEXAMIC ACID 1000 MG/10ML IV SOLN
1.5000 mg/kg/h | INTRAVENOUS | Status: AC
  Administered 2023-10-12: 1.5 mg/kg/h via INTRAVENOUS
  Filled 2023-10-11: qty 25

## 2023-10-11 MED ORDER — CHLORHEXIDINE GLUCONATE CLOTH 2 % EX PADS
6.0000 | MEDICATED_PAD | Freq: Once | CUTANEOUS | Status: AC
  Administered 2023-10-12: 6 via TOPICAL

## 2023-10-11 MED ORDER — ISOSORBIDE MONONITRATE ER 30 MG PO TB24
15.0000 mg | ORAL_TABLET | Freq: Every day | ORAL | Status: DC
  Administered 2023-10-11: 15 mg via ORAL
  Filled 2023-10-11: qty 1

## 2023-10-11 MED ORDER — ATORVASTATIN CALCIUM 80 MG PO TABS
80.0000 mg | ORAL_TABLET | Freq: Every day | ORAL | Status: DC
  Administered 2023-10-13 – 2023-10-18 (×6): 80 mg via ORAL
  Filled 2023-10-11 (×6): qty 1

## 2023-10-11 MED ORDER — CEFAZOLIN SODIUM-DEXTROSE 2-4 GM/100ML-% IV SOLN
2.0000 g | INTRAVENOUS | Status: AC
  Administered 2023-10-12: 2 g via INTRAVENOUS
  Filled 2023-10-11: qty 100

## 2023-10-11 MED ORDER — TRANEXAMIC ACID (OHS) BOLUS VIA INFUSION
15.0000 mg/kg | INTRAVENOUS | Status: AC
  Administered 2023-10-12: 1327.5 mg via INTRAVENOUS
  Filled 2023-10-11: qty 1328

## 2023-10-11 MED ORDER — TEMAZEPAM 15 MG PO CAPS: 15.0000 mg | ORAL_CAPSULE | Freq: Once | ORAL | Status: DC | PRN

## 2023-10-11 MED ORDER — METOPROLOL TARTRATE 12.5 MG HALF TABLET: 12.5000 mg | ORAL_TABLET | Freq: Once | ORAL | Status: DC

## 2023-10-11 MED ORDER — CHLORHEXIDINE GLUCONATE 0.12 % MT SOLN
15.0000 mL | Freq: Once | OROMUCOSAL | Status: AC
Start: 2023-10-12 — End: 2023-10-12
  Administered 2023-10-12: 15 mL via OROMUCOSAL
  Filled 2023-10-11: qty 15

## 2023-10-11 MED ORDER — CHLORHEXIDINE GLUCONATE CLOTH 2 % EX PADS
6.0000 | MEDICATED_PAD | Freq: Once | CUTANEOUS | Status: AC
  Administered 2023-10-11: 6 via TOPICAL

## 2023-10-11 MED ORDER — TRANEXAMIC ACID (OHS) PUMP PRIME SOLUTION
2.0000 mg/kg | INTRAVENOUS | Status: DC
  Filled 2023-10-11: qty 1.77

## 2023-10-11 MED ORDER — POTASSIUM CHLORIDE 2 MEQ/ML IV SOLN
80.0000 meq | INTRAVENOUS | Status: DC
  Filled 2023-10-11: qty 40

## 2023-10-11 MED ORDER — MILRINONE LACTATE IN DEXTROSE 20-5 MG/100ML-% IV SOLN
0.3000 ug/kg/min | INTRAVENOUS | Status: DC
  Filled 2023-10-11: qty 100

## 2023-10-11 MED FILL — Lidocaine HCl Local Preservative Free (PF) Inj 1%: INTRAMUSCULAR | Qty: 30 | Status: AC

## 2023-10-11 NOTE — Progress Notes (Signed)
Pt had episode of brady to 39 1 hr after he received 25 mg metoprolol.Currently staying high 40s to 50s. Notified oncall(Sullivan). Pt BP 132/67.

## 2023-10-11 NOTE — Progress Notes (Signed)
Informed Shelby, floor RN of need for Covid test prior to surgery tomorrow. Verbalized understanding of need to collect sample and send to lab at this time.

## 2023-10-11 NOTE — Progress Notes (Signed)
PHARMACY - ANTICOAGULATION CONSULT NOTE  Pharmacy Consult for heparin Indication: Severe multivessel CAD   Allergies  Allergen Reactions   Atenolol     bradycarda- heart rate down to 38 bpm   Demerol Nausea And Vomiting    Patient Measurements: Height: 5\' 8"  (172.7 cm) Weight: 88.5 kg (195 lb) IBW/kg (Calculated) : 68.4 Heparin Dosing Weight: 86 kg  Vital Signs: Temp: 98.3 F (36.8 C) (11/06 0504) Temp Source: Oral (11/06 0504) BP: 120/51 (11/06 0504) Pulse Rate: 52 (11/06 0504)  Labs: Recent Labs    10/11/23 0449  HGB 12.7*  HCT 38.6*  PLT 227  HEPARINUNFRC 0.20*    Estimated Creatinine Clearance: 80.2 mL/min (by C-G formula based on SCr of 0.9 mg/dL).   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 No anticoagulation prior to admission. Pharmacy consulted for heparin.    -heparin level= 0.2 on 1250 units/hr  Goal of Therapy:  Heparin level 0.3-0.7 units/ml Monitor platelets by anticoagulation protocol: Yes   Plan:  -Increase heparin to 1450 units/hr -heparin level in 8 hrs  Harland German, PharmD Clinical Pharmacist **Pharmacist phone directory can now be found on [email or web address] ([email or web address]).  Listed under [facility]  Pharmacy.

## 2023-10-11 NOTE — Progress Notes (Signed)
Echocardiogram 2D Echocardiogram has been performed.  Warren Lacy Shary Lamos RDCS 10/11/2023, 11:10 AM

## 2023-10-11 NOTE — Plan of Care (Signed)

## 2023-10-11 NOTE — Progress Notes (Signed)
Discussed with pt IS (2100 ml), sternal precautions, mobility post op, and d/c planning. Receptive with appropriate questions. Gave OHS booklet and video to view. Pt will be eager to mobilize. [id number] Ethelda Chick BS, ACSM-CEP 10/11/2023 1:27 PM

## 2023-10-11 NOTE — Progress Notes (Signed)
PHARMACY - ANTICOAGULATION CONSULT NOTE  Pharmacy Consult for heparin Indication: Severe multivessel CAD   Allergies  Allergen Reactions   Atenolol     bradycarda- heart rate down to 38 bpm   Demerol Nausea And Vomiting    Patient Measurements: Height: 5\' 8"  (172.7 cm) Weight: 88.5 kg (195 lb) IBW/kg (Calculated) : 68.4 Heparin Dosing Weight: 86 kg  Vital Signs: Temp: 98.2 F (36.8 C) (11/06 1617) Temp Source: Oral (11/06 1617) BP: 133/68 (11/06 1617) Pulse Rate: 57 (11/06 1617)  Labs: Recent Labs    10/11/23 0449 10/11/23 1606  HGB 12.7*  --   HCT 38.6*  --   PLT 227  --   HEPARINUNFRC 0.20* 0.50    Estimated Creatinine Clearance: 80.2 mL/min (by C-G formula based on SCr of 0.9 mg/dL).   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 No anticoagulation prior to admission. Pharmacy consulted for heparin.    Heparin level 0.5 is therapeutic on 1450 units/hr.   Goal of Therapy:  Heparin level 0.3-0.7 units/ml Monitor platelets by anticoagulation protocol: Yes   Plan:  Continue heparin to 1450 units/hr Monitor daily heparin level, CBC, signs/symptoms of bleeding    Alphia Moh, PharmD, BCPS, BCCP Clinical Pharmacist  Please  check AMION for all [facility] Pharmacy phone numbers After 10:00 PM, call Main Pharmacy [phone]

## 2023-10-11 NOTE — Progress Notes (Signed)
Patient Name: Leroy Proctor Date of Encounter: 10/11/2023 [facility] Cardiologist: None   Interval Summary  .    Patient resting comfortably in bed this morning. Denies chest pain, just reports a vague tightness sensation which he says has been chronic for several weeks. No dyspnea reported this AM.   Vital Signs .    Vitals:   10/11/23 0000 10/11/23 0334 10/11/23 0504 10/11/23 0818  BP:  (!) 152/81 (!) 120/51 (!) 147/72  Pulse: (!) 47 (!) 54 (!) 52 (!) 58  Resp:  18 16 16   Temp:   98.3 F (36.8 C) 97.9 F (36.6 C)  TempSrc:   Oral Oral  SpO2: 94% 94% 94% 93%  Weight:      Height:        Intake/Output Summary (Last 24 hours) at 10/11/2023 0839 Last data filed at 10/11/2023 0806 Gross per 24 hour  Intake 1980.74 ml  Output 1950 ml  Net 30.74 ml      10/10/2023   10:25 AM 10/05/2023   10:54 AM 09/19/2023    9:36 AM  Last 3 Weights  Weight (lbs) 195 lb 200 lb 195 lb 9.6 oz  Weight (kg) 88.451 kg 90.719 kg 88.724 kg      Telemetry/ECG    Sinus bradycardia with frequent PVCs. LBBB morphology - Personally Reviewed  Physical Exam .   GEN: No acute distress.   Neck: No JVD Cardiac: irregular with frequent PVCs, no murmurs, rubs, or gallops.  Respiratory: Clear to auscultation bilaterally. GI: Soft, nontender, non-distended  MS: No edema  Assessment & Plan .     Leroy Proctor 72 year old male patient with history of coronary atherosclerosis three-vessel hemodynamically significant by CT FFR, hypertension, hyperlipidemia, renal stones, GERD, erectile dysfunction, CKD stage II, left bundle branch block morphology, frequent PVCs on prior EKG, former smoker.  Multi-vessel CAD  Patient initially evaluated on 09/19/23 following ED visit for chest pain. Patient very active at baseline but has had increased chest discomfort and heaviness with exertion and recently at rest. Cardiac CTA completed with score 1624, CAD RADS 4 study with severe stenosis of proximal LAD,  moderate stenosis in distal LAD, moderate stenosis proximal LCx, moderate stenosis of RCA, with hemodynamically significant lesions in all 3 territories with CT FFR.   Patient underwent LHC on 10/10/23 with Dr. Herbie Proctor and was found to have severe multivessel CAD involving extensive disease lesions throughout the RCA, 2 focal lesions 1 numb 99% in the very early mid LAD as well as severe 90% stenosis of the of the Lcx. Cardiothoracic surgery consult requested. CTCS APP has seen, patient felt to be good candidate for CABG and is pending surgeon visit.  Echocardiogram pending Continue Heparin  Continue ASA 81mg  Continue Metoprolol Tartrate 25mg  BID Add imdur 15mg  given chest tightness.   Hypertension  Patient on Metoprolol Tartrate 25mg  BID and Amlodipine 10mg  PTA. BP moderately elevated today.  No room to up-titrate beta blocker with sinus bradycardia. Continue Metoprolol Tartrate 25mg  BID and amlodipine 10mg . If pressures remain high today, could add Hydralazine.  Consider ACEi/ARB after CABG  Hyperlipidemia  No recent lipid panel per labs. Will add on to this morning's draw. Continue Atorvastatin 40mg . LPA pending.   Lab Results  Component Value Date   CHOL 168 04/19/2022   HDL 49.80 04/19/2022   LDLCALC 99 04/19/2022   TRIG 96.0 04/19/2022   CHOLHDL 3 04/19/2022     For questions or updates, please contact Leroy Proctor Please consult [email or web address]  for contact info under        Signed, Leroy Gold, PA-C

## 2023-10-12 ENCOUNTER — Inpatient Hospital Stay (HOSPITAL_COMMUNITY): Payer: Medicare PPO

## 2023-10-12 ENCOUNTER — Inpatient Hospital Stay (HOSPITAL_COMMUNITY): Payer: Medicare PPO | Admitting: Certified Registered Nurse Anesthetist

## 2023-10-12 ENCOUNTER — Encounter (HOSPITAL_COMMUNITY): Payer: Self-pay | Admitting: Cardiology

## 2023-10-12 ENCOUNTER — Inpatient Hospital Stay (HOSPITAL_COMMUNITY): Payer: Self-pay | Admitting: Certified Registered Nurse Anesthetist

## 2023-10-12 ENCOUNTER — Other Ambulatory Visit: Payer: Self-pay

## 2023-10-12 ENCOUNTER — Inpatient Hospital Stay (HOSPITAL_COMMUNITY)
Admission: AD | Disposition: A | Discharge status: Home / Self Care | Payer: Self-pay | Source: Home / Self Care | Attending: Thoracic Surgery (Cardiothoracic Vascular Surgery)

## 2023-10-12 DIAGNOSIS — Z978 Presence of other specified devices: Secondary | ICD-10-CM

## 2023-10-12 DIAGNOSIS — I2510 Atherosclerotic heart disease of native coronary artery without angina pectoris: Secondary | ICD-10-CM

## 2023-10-12 DIAGNOSIS — I200 Unstable angina: Secondary | ICD-10-CM

## 2023-10-12 DIAGNOSIS — Z951 Presence of aortocoronary bypass graft: Secondary | ICD-10-CM

## 2023-10-12 DIAGNOSIS — D62 Acute posthemorrhagic anemia: Secondary | ICD-10-CM

## 2023-10-12 HISTORY — DX: Presence of other specified devices: Z97.8

## 2023-10-12 HISTORY — PX: CORONARY ARTERY BYPASS GRAFT: SHX141

## 2023-10-12 HISTORY — PX: TEE WITHOUT CARDIOVERSION: SHX5443

## 2023-10-12 HISTORY — DX: Presence of aortocoronary bypass graft: Z95.1

## 2023-10-12 HISTORY — DX: Acute posthemorrhagic anemia: D62

## 2023-10-12 LAB — POCT I-STAT 7, (LYTES, BLD GAS, ICA,H+H)
Acid-base deficit: 5 mmol/L — ABNORMAL HIGH (ref 0.0–2.0)
Acid-base deficit: 4 mmol/L — ABNORMAL HIGH (ref 0.0–2.0)
Acid-base deficit: 2 mmol/L (ref 0.0–2.0)
Acid-base deficit: 4 mmol/L — ABNORMAL HIGH (ref 0.0–2.0)
Acid-base deficit: 5 mmol/L — ABNORMAL HIGH (ref 0.0–2.0)
Acid-base deficit: 5 mmol/L — ABNORMAL HIGH (ref 0.0–2.0)
Acid-base deficit: 5 mmol/L — ABNORMAL HIGH (ref 0.0–2.0)
Bicarbonate: 20.3 mmol/L (ref 20.0–28.0)
Bicarbonate: 22.1 mmol/L (ref 20.0–28.0)
Bicarbonate: 22.9 mmol/L (ref 20.0–28.0)
Bicarbonate: 21.1 mmol/L (ref 20.0–28.0)
Bicarbonate: 21.6 mmol/L (ref 20.0–28.0)
Bicarbonate: 20.5 mmol/L (ref 20.0–28.0)
Bicarbonate: 20.9 mmol/L (ref 20.0–28.0)
Calcium, Ion: 1.14 mmol/L — ABNORMAL LOW (ref 1.15–1.40)
Calcium, Ion: 0.99 mmol/L — ABNORMAL LOW (ref 1.15–1.40)
Calcium, Ion: 1.03 mmol/L — ABNORMAL LOW (ref 1.15–1.40)
Calcium, Ion: 1.24 mmol/L (ref 1.15–1.40)
Calcium, Ion: 1.24 mmol/L (ref 1.15–1.40)
Calcium, Ion: 1.1 mmol/L — ABNORMAL LOW (ref 1.15–1.40)
Calcium, Ion: 1.14 mmol/L — ABNORMAL LOW (ref 1.15–1.40)
HCT: 40 % (ref 39.0–52.0)
HCT: 30 % — ABNORMAL LOW (ref 39.0–52.0)
HCT: 30 % — ABNORMAL LOW (ref 39.0–52.0)
HCT: 31 % — ABNORMAL LOW (ref 39.0–52.0)
HCT: 33 % — ABNORMAL LOW (ref 39.0–52.0)
HCT: 29 % — ABNORMAL LOW (ref 39.0–52.0)
HCT: 30 % — ABNORMAL LOW (ref 39.0–52.0)
Hemoglobin: 13.6 g/dL (ref 13.0–17.0)
Hemoglobin: 10.2 g/dL — ABNORMAL LOW (ref 13.0–17.0)
Hemoglobin: 10.2 g/dL — ABNORMAL LOW (ref 13.0–17.0)
Hemoglobin: 10.5 g/dL — ABNORMAL LOW (ref 13.0–17.0)
Hemoglobin: 11.2 g/dL — ABNORMAL LOW (ref 13.0–17.0)
Hemoglobin: 9.9 g/dL — ABNORMAL LOW (ref 13.0–17.0)
Hemoglobin: 10.2 g/dL — ABNORMAL LOW (ref 13.0–17.0)
O2 Saturation: 100 %
O2 Saturation: 100 %
O2 Saturation: 100 %
O2 Saturation: 100 %
O2 Saturation: 97 %
O2 Saturation: 98 %
O2 Saturation: 87 %
Patient temperature: 36.4
Patient temperature: 37.9
Patient temperature: 37.4
Potassium: 3.5 mmol/L (ref 3.5–5.1)
Potassium: 3.7 mmol/L (ref 3.5–5.1)
Potassium: 4.5 mmol/L (ref 3.5–5.1)
Potassium: 4 mmol/L (ref 3.5–5.1)
Potassium: 3.8 mmol/L (ref 3.5–5.1)
Potassium: 4.1 mmol/L (ref 3.5–5.1)
Potassium: 4 mmol/L (ref 3.5–5.1)
Sodium: 140 mmol/L (ref 135–145)
Sodium: 139 mmol/L (ref 135–145)
Sodium: 138 mmol/L (ref 135–145)
Sodium: 139 mmol/L (ref 135–145)
Sodium: 140 mmol/L (ref 135–145)
Sodium: 139 mmol/L (ref 135–145)
Sodium: 139 mmol/L (ref 135–145)
TCO2: 22 mmol/L (ref 22–32)
TCO2: 23 mmol/L (ref 22–32)
TCO2: 24 mmol/L (ref 22–32)
TCO2: 22 mmol/L (ref 22–32)
TCO2: 23 mmol/L (ref 22–32)
TCO2: 22 mmol/L (ref 22–32)
TCO2: 22 mmol/L (ref 22–32)
pCO2 arterial: 39.5 mm[Hg] (ref 32–48)
pCO2 arterial: 42.3 mm[Hg] (ref 32–48)
pCO2 arterial: 39.1 mm[Hg] (ref 32–48)
pCO2 arterial: 37.5 mm[Hg] (ref 32–48)
pCO2 arterial: 43.4 mm[Hg] (ref 32–48)
pCO2 arterial: 39.4 mm[Hg] (ref 32–48)
pCO2 arterial: 39.6 mm[Hg] (ref 32–48)
pH, Arterial: 7.32 — ABNORMAL LOW (ref 7.35–7.45)
pH, Arterial: 7.326 — ABNORMAL LOW (ref 7.35–7.45)
pH, Arterial: 7.375 (ref 7.35–7.45)
pH, Arterial: 7.358 (ref 7.35–7.45)
pH, Arterial: 7.301 — ABNORMAL LOW (ref 7.35–7.45)
pH, Arterial: 7.329 — ABNORMAL LOW (ref 7.35–7.45)
pH, Arterial: 7.332 — ABNORMAL LOW (ref 7.35–7.45)
pO2, Arterial: 189 mm[Hg] — ABNORMAL HIGH (ref 83–108)
pO2, Arterial: 364 mm[Hg] — ABNORMAL HIGH (ref 83–108)
pO2, Arterial: 309 mm[Hg] — ABNORMAL HIGH (ref 83–108)
pO2, Arterial: 385 mm[Hg] — ABNORMAL HIGH (ref 83–108)
pO2, Arterial: 93 mm[Hg] (ref 83–108)
pO2, Arterial: 111 mm[Hg] — ABNORMAL HIGH (ref 83–108)
pO2, Arterial: 58 mm[Hg] — ABNORMAL LOW (ref 83–108)

## 2023-10-12 LAB — POCT I-STAT, CHEM 8
BUN: 18 mg/dL (ref 8–23)
BUN: 18 mg/dL (ref 8–23)
BUN: 18 mg/dL (ref 8–23)
BUN: 17 mg/dL (ref 8–23)
Calcium, Ion: 1.15 mmol/L (ref 1.15–1.40)
Calcium, Ion: 1.15 mmol/L (ref 1.15–1.40)
Calcium, Ion: 1.04 mmol/L — ABNORMAL LOW (ref 1.15–1.40)
Calcium, Ion: 1.24 mmol/L (ref 1.15–1.40)
Chloride: 104 mmol/L (ref 98–111)
Chloride: 103 mmol/L (ref 98–111)
Chloride: 103 mmol/L (ref 98–111)
Chloride: 104 mmol/L (ref 98–111)
Creatinine, Ser: 0.8 mg/dL (ref 0.61–1.24)
Creatinine, Ser: 0.8 mg/dL (ref 0.61–1.24)
Creatinine, Ser: 0.7 mg/dL (ref 0.61–1.24)
Creatinine, Ser: 0.7 mg/dL (ref 0.61–1.24)
Glucose, Bld: 135 mg/dL — ABNORMAL HIGH (ref 70–99)
Glucose, Bld: 139 mg/dL — ABNORMAL HIGH (ref 70–99)
Glucose, Bld: 125 mg/dL — ABNORMAL HIGH (ref 70–99)
Glucose, Bld: 161 mg/dL — ABNORMAL HIGH (ref 70–99)
HCT: 37 % — ABNORMAL LOW (ref 39.0–52.0)
HCT: 37 % — ABNORMAL LOW (ref 39.0–52.0)
HCT: 30 % — ABNORMAL LOW (ref 39.0–52.0)
HCT: 30 % — ABNORMAL LOW (ref 39.0–52.0)
Hemoglobin: 12.6 g/dL — ABNORMAL LOW (ref 13.0–17.0)
Hemoglobin: 12.6 g/dL — ABNORMAL LOW (ref 13.0–17.0)
Hemoglobin: 10.2 g/dL — ABNORMAL LOW (ref 13.0–17.0)
Hemoglobin: 10.2 g/dL — ABNORMAL LOW (ref 13.0–17.0)
Potassium: 3.6 mmol/L (ref 3.5–5.1)
Potassium: 3.6 mmol/L (ref 3.5–5.1)
Potassium: 4.2 mmol/L (ref 3.5–5.1)
Potassium: 4 mmol/L (ref 3.5–5.1)
Sodium: 140 mmol/L (ref 135–145)
Sodium: 139 mmol/L (ref 135–145)
Sodium: 139 mmol/L (ref 135–145)
Sodium: 139 mmol/L (ref 135–145)
TCO2: 22 mmol/L (ref 22–32)
TCO2: 23 mmol/L (ref 22–32)
TCO2: 25 mmol/L (ref 22–32)
TCO2: 24 mmol/L (ref 22–32)

## 2023-10-12 LAB — POCT I-STAT EG7
Acid-base deficit: 3 mmol/L — ABNORMAL HIGH (ref 0.0–2.0)
Bicarbonate: 22.9 mmol/L (ref 20.0–28.0)
Calcium, Ion: 1.03 mmol/L — ABNORMAL LOW (ref 1.15–1.40)
HCT: 30 % — ABNORMAL LOW (ref 39.0–52.0)
Hemoglobin: 10.2 g/dL — ABNORMAL LOW (ref 13.0–17.0)
O2 Saturation: 85 %
Potassium: 4.5 mmol/L (ref 3.5–5.1)
Sodium: 138 mmol/L (ref 135–145)
TCO2: 24 mmol/L (ref 22–32)
pCO2, Ven: 44.4 mm[Hg] (ref 44–60)
pH, Ven: 7.321 (ref 7.25–7.43)
pO2, Ven: 54 mm[Hg] — ABNORMAL HIGH (ref 32–45)

## 2023-10-12 LAB — GLUCOSE, CAPILLARY
Glucose-Capillary: 110 mg/dL — ABNORMAL HIGH (ref 70–99)
Glucose-Capillary: 111 mg/dL — ABNORMAL HIGH (ref 70–99)
Glucose-Capillary: 118 mg/dL — ABNORMAL HIGH (ref 70–99)
Glucose-Capillary: 118 mg/dL — ABNORMAL HIGH (ref 70–99)
Glucose-Capillary: 123 mg/dL — ABNORMAL HIGH (ref 70–99)
Glucose-Capillary: 124 mg/dL — ABNORMAL HIGH (ref 70–99)
Glucose-Capillary: 133 mg/dL — ABNORMAL HIGH (ref 70–99)
Glucose-Capillary: 135 mg/dL — ABNORMAL HIGH (ref 70–99)
Glucose-Capillary: 136 mg/dL — ABNORMAL HIGH (ref 70–99)
Glucose-Capillary: 148 mg/dL — ABNORMAL HIGH (ref 70–99)
Glucose-Capillary: 95 mg/dL (ref 70–99)

## 2023-10-12 LAB — CBC
HCT: 38.5 % — ABNORMAL LOW (ref 39.0–52.0)
HCT: 32.1 % — ABNORMAL LOW (ref 39.0–52.0)
HCT: 30.6 % — ABNORMAL LOW (ref 39.0–52.0)
Hemoglobin: 13 g/dL (ref 13.0–17.0)
Hemoglobin: 10.6 g/dL — ABNORMAL LOW (ref 13.0–17.0)
Hemoglobin: 10 g/dL — ABNORMAL LOW (ref 13.0–17.0)
MCH: 29 pg (ref 26.0–34.0)
MCH: 28.6 pg (ref 26.0–34.0)
MCH: 28.6 pg (ref 26.0–34.0)
MCHC: 33.8 g/dL (ref 30.0–36.0)
MCHC: 33 g/dL (ref 30.0–36.0)
MCHC: 32.7 g/dL (ref 30.0–36.0)
MCV: 85.7 fL (ref 80.0–100.0)
MCV: 86.5 fL (ref 80.0–100.0)
MCV: 87.4 fL (ref 80.0–100.0)
Platelets: 239 10*3/uL (ref 150–400)
Platelets: 165 10*3/uL (ref 150–400)
Platelets: 141 10*3/uL — ABNORMAL LOW (ref 150–400)
RBC: 4.49 MIL/uL (ref 4.22–5.81)
RBC: 3.71 MIL/uL — ABNORMAL LOW (ref 4.22–5.81)
RBC: 3.5 MIL/uL — ABNORMAL LOW (ref 4.22–5.81)
RDW: 12.7 % (ref 11.5–15.5)
RDW: 12.5 % (ref 11.5–15.5)
RDW: 12.6 % (ref 11.5–15.5)
WBC: 6.2 10*3/uL (ref 4.0–10.5)
WBC: 8.7 10*3/uL (ref 4.0–10.5)
WBC: 6.6 10*3/uL (ref 4.0–10.5)
nRBC: 0 % (ref 0.0–0.2)
nRBC: 0 % (ref 0.0–0.2)
nRBC: 0 % (ref 0.0–0.2)

## 2023-10-12 LAB — URINALYSIS, ROUTINE W REFLEX MICROSCOPIC
Bilirubin Urine: NEGATIVE
Glucose, UA: NEGATIVE mg/dL
Hgb urine dipstick: NEGATIVE
Ketones, ur: NEGATIVE mg/dL
Leukocytes,Ua: NEGATIVE
Nitrite: NEGATIVE
Protein, ur: NEGATIVE mg/dL
Specific Gravity, Urine: 1.018 (ref 1.005–1.030)
pH: 6 (ref 5.0–8.0)

## 2023-10-12 LAB — BASIC METABOLIC PANEL
Anion gap: 9 (ref 5–15)
Anion gap: 12 (ref 5–15)
BUN: 20 mg/dL (ref 8–23)
BUN: 12 mg/dL (ref 8–23)
CO2: 22 mmol/L (ref 22–32)
CO2: 20 mmol/L — ABNORMAL LOW (ref 22–32)
Calcium: 8.5 mg/dL — ABNORMAL LOW (ref 8.9–10.3)
Calcium: 7.6 mg/dL — ABNORMAL LOW (ref 8.9–10.3)
Chloride: 104 mmol/L (ref 98–111)
Chloride: 105 mmol/L (ref 98–111)
Creatinine, Ser: 1.01 mg/dL (ref 0.61–1.24)
Creatinine, Ser: 0.88 mg/dL (ref 0.61–1.24)
GFR, Estimated: >60 mL/min (ref >60)
GFR, Estimated: >60 mL/min (ref >60)
Glucose, Bld: 96 mg/dL (ref 70–99)
Glucose, Bld: 114 mg/dL — ABNORMAL HIGH (ref 70–99)
Potassium: 3.6 mmol/L (ref 3.5–5.1)
Potassium: 3.9 mmol/L (ref 3.5–5.1)
Sodium: 135 mmol/L (ref 135–145)
Sodium: 137 mmol/L (ref 135–145)

## 2023-10-12 LAB — HEMOGLOBIN AND HEMATOCRIT, BLOOD
HCT: 29.7 % — ABNORMAL LOW (ref 39.0–52.0)
Hemoglobin: 10 g/dL — ABNORMAL LOW (ref 13.0–17.0)

## 2023-10-12 LAB — PROTIME-INR
INR: 1.5 — ABNORMAL HIGH (ref 0.8–1.2)
Prothrombin Time: 18.5 s — ABNORMAL HIGH (ref 11.4–15.2)

## 2023-10-12 LAB — BLOOD GAS, ARTERIAL
Acid-Base Excess: 0.8 mmol/L (ref 0.0–2.0)
Bicarbonate: 25.3 mmol/L (ref 20.0–28.0)
O2 Saturation: 97.5 %
Patient temperature: 36.8
pCO2 arterial: 39 mm[Hg] (ref 32–48)
pH, Arterial: 7.42 (ref 7.35–7.45)
pO2, Arterial: 82 mm[Hg] — ABNORMAL LOW (ref 83–108)

## 2023-10-12 LAB — HEPARIN LEVEL (UNFRACTIONATED): Heparin Unfractionated: 0.49 [IU]/mL (ref 0.30–0.70)

## 2023-10-12 LAB — APTT: aPTT: 37 s — ABNORMAL HIGH (ref 24–36)

## 2023-10-12 LAB — LIPOPROTEIN A (LPA): Lipoprotein (a): 119.2 nmol/L — ABNORMAL HIGH (ref <75.0)

## 2023-10-12 LAB — PLATELET COUNT: Platelets: 210 10*3/uL (ref 150–400)

## 2023-10-12 SURGERY — CORONARY ARTERY BYPASS GRAFTING (CABG)
Anesthesia: General | Site: Chest

## 2023-10-12 MED ORDER — 0.9 % SODIUM CHLORIDE (POUR BTL) OPTIME
TOPICAL | Status: DC | PRN
  Administered 2023-10-12: 5000 mL

## 2023-10-12 MED ORDER — ROCURONIUM BROMIDE 10 MG/ML (PF) SYRINGE
PREFILLED_SYRINGE | INTRAVENOUS | Status: AC
  Filled 2023-10-12: qty 20

## 2023-10-12 MED ORDER — DEXTROSE 50 % IV SOLN: 0.0000 mL | INTRAVENOUS | Status: DC | PRN

## 2023-10-12 MED ORDER — PHENYLEPHRINE 80 MCG/ML (10ML) SYRINGE FOR IV PUSH (FOR BLOOD PRESSURE SUPPORT)
PREFILLED_SYRINGE | INTRAVENOUS | Status: DC | PRN
  Administered 2023-10-12 (×2): 160 ug via INTRAVENOUS

## 2023-10-12 MED ORDER — METOPROLOL TARTRATE 25 MG/10 ML ORAL SUSPENSION: 12.5000 mg | Freq: Two times a day (BID) | ORAL | Status: DC

## 2023-10-12 MED ORDER — PLASMA-LYTE A IV SOLN
INTRAVENOUS | Status: DC | PRN
  Administered 2023-10-12: 500 mL

## 2023-10-12 MED ORDER — PHENYLEPHRINE HCL-NACL 20-0.9 MG/250ML-% IV SOLN
INTRAVENOUS | Status: DC | PRN
  Administered 2023-10-12: 50 ug/min via INTRAVENOUS

## 2023-10-12 MED ORDER — ASPIRIN 325 MG PO TBEC
325.0000 mg | DELAYED_RELEASE_TABLET | Freq: Every day | ORAL | Status: DC
  Administered 2023-10-13 – 2023-10-18 (×6): 325 mg via ORAL
  Filled 2023-10-12 (×6): qty 1

## 2023-10-12 MED ORDER — METOPROLOL TARTRATE 5 MG/5ML IV SOLN: 2.5000 mg | INTRAVENOUS | Status: DC | PRN

## 2023-10-12 MED ORDER — PROTAMINE SULFATE 10 MG/ML IV SOLN
INTRAVENOUS | Status: AC
  Filled 2023-10-12: qty 5

## 2023-10-12 MED ORDER — DOCUSATE SODIUM 50 MG/5ML PO LIQD: 200.0000 mg | Freq: Every day | ORAL | Status: DC

## 2023-10-12 MED ORDER — ASPIRIN 81 MG PO CHEW: 324.0000 mg | CHEWABLE_TABLET | Freq: Every day | ORAL | Status: DC

## 2023-10-12 MED ORDER — ONDANSETRON HCL 4 MG/2ML IJ SOLN
INTRAMUSCULAR | Status: DC | PRN
  Administered 2023-10-12: 4 mg via INTRAVENOUS

## 2023-10-12 MED ORDER — ACETAMINOPHEN 160 MG/5ML PO SOLN: 1000.0000 mg | Freq: Four times a day (QID) | ORAL | Status: AC

## 2023-10-12 MED ORDER — PROPOFOL 10 MG/ML IV BOLUS
INTRAVENOUS | Status: AC
  Filled 2023-10-12: qty 20

## 2023-10-12 MED ORDER — CHLORHEXIDINE GLUCONATE CLOTH 2 % EX PADS
6.0000 | MEDICATED_PAD | Freq: Every day | CUTANEOUS | Status: DC
  Administered 2023-10-12 – 2023-10-18 (×6): 6 via TOPICAL

## 2023-10-12 MED ORDER — ACETAMINOPHEN 500 MG PO TABS
1000.0000 mg | ORAL_TABLET | Freq: Four times a day (QID) | ORAL | Status: AC
  Administered 2023-10-12 – 2023-10-16 (×13): 1000 mg via ORAL
  Filled 2023-10-12 (×13): qty 2

## 2023-10-12 MED ORDER — NITROGLYCERIN 0.2 MG/ML ON CALL CATH LAB
INTRAVENOUS | Status: DC | PRN
  Administered 2023-10-12 (×2): 10 ug via INTRAVENOUS

## 2023-10-12 MED ORDER — PANTOPRAZOLE SODIUM 40 MG PO TBEC
40.0000 mg | DELAYED_RELEASE_TABLET | Freq: Every day | ORAL | Status: DC
  Administered 2023-10-14 – 2023-10-18 (×5): 40 mg via ORAL
  Filled 2023-10-12 (×5): qty 1

## 2023-10-12 MED ORDER — VANCOMYCIN HCL IN DEXTROSE 1-5 GM/200ML-% IV SOLN
1000.0000 mg | Freq: Once | INTRAVENOUS | Status: AC
  Administered 2023-10-12: 1000 mg via INTRAVENOUS
  Filled 2023-10-12: qty 200

## 2023-10-12 MED ORDER — FENTANYL CITRATE (PF) 250 MCG/5ML IJ SOLN
INTRAMUSCULAR | Status: AC
  Filled 2023-10-12: qty 5

## 2023-10-12 MED ORDER — SUCCINYLCHOLINE CHLORIDE 200 MG/10ML IV SOSY
PREFILLED_SYRINGE | INTRAVENOUS | Status: AC
  Filled 2023-10-12: qty 10

## 2023-10-12 MED ORDER — GLYCOPYRROLATE PF 0.2 MG/ML IJ SOSY
PREFILLED_SYRINGE | INTRAMUSCULAR | Status: AC
  Filled 2023-10-12: qty 1

## 2023-10-12 MED ORDER — EPHEDRINE SULFATE-NACL 50-0.9 MG/10ML-% IV SOSY
PREFILLED_SYRINGE | INTRAVENOUS | Status: DC | PRN
  Administered 2023-10-12: 7.5 mg via INTRAVENOUS
  Administered 2023-10-12 (×2): 5 mg via INTRAVENOUS

## 2023-10-12 MED ORDER — SUCCINYLCHOLINE CHLORIDE 200 MG/10ML IV SOSY
PREFILLED_SYRINGE | INTRAVENOUS | Status: DC | PRN
  Administered 2023-10-12: 120 mg via INTRAVENOUS

## 2023-10-12 MED ORDER — CHLORHEXIDINE GLUCONATE 0.12 % MT SOLN
15.0000 mL | OROMUCOSAL | Status: AC
  Administered 2023-10-12: 15 mL via OROMUCOSAL
  Filled 2023-10-12: qty 15

## 2023-10-12 MED ORDER — NOREPINEPHRINE 4 MG/250ML-% IV SOLN: 0.0000 ug/min | INTRAVENOUS | Status: DC

## 2023-10-12 MED ORDER — POTASSIUM CHLORIDE 10 MEQ/50ML IV SOLN
10.0000 meq | INTRAVENOUS | Status: AC
  Administered 2023-10-12 (×3): 10 meq via INTRAVENOUS

## 2023-10-12 MED ORDER — EPHEDRINE 5 MG/ML INJ
INTRAVENOUS | Status: AC
  Filled 2023-10-12: qty 5

## 2023-10-12 MED ORDER — PROTAMINE SULFATE 10 MG/ML IV SOLN
INTRAVENOUS | Status: DC | PRN
  Administered 2023-10-12 (×2): 20 mg via INTRAVENOUS
  Administered 2023-10-12: 290 mg via INTRAVENOUS

## 2023-10-12 MED ORDER — MIDAZOLAM HCL 2 MG/2ML IJ SOLN
2.0000 mg | INTRAMUSCULAR | Status: DC | PRN
  Administered 2023-10-12: 2 mg via INTRAVENOUS
  Filled 2023-10-12: qty 2

## 2023-10-12 MED ORDER — TRAMADOL HCL 50 MG PO TABS
50.0000 mg | ORAL_TABLET | ORAL | Status: DC | PRN
Start: 2023-10-12 — End: 2023-10-18
  Administered 2023-10-12 – 2023-10-15 (×4): 100 mg via ORAL
  Filled 2023-10-12 (×4): qty 2

## 2023-10-12 MED ORDER — HEPARIN SODIUM (PORCINE) 1000 UNIT/ML IJ SOLN
INTRAMUSCULAR | Status: AC
  Filled 2023-10-12: qty 10

## 2023-10-12 MED ORDER — LACTATED RINGERS IV SOLN: INTRAVENOUS | Status: AC

## 2023-10-12 MED ORDER — SODIUM CHLORIDE 0.45 % IV SOLN: INTRAVENOUS | Status: AC | PRN

## 2023-10-12 MED ORDER — SODIUM CHLORIDE 0.9 % IV SOLN
250.0000 mL | INTRAVENOUS | Status: DC
Start: 2023-10-13 — End: 2023-10-12

## 2023-10-12 MED ORDER — ASPIRIN 81 MG PO CHEW
324.0000 mg | CHEWABLE_TABLET | Freq: Once | ORAL | Status: AC
  Administered 2023-10-12: 324 mg via ORAL
  Filled 2023-10-12: qty 4

## 2023-10-12 MED ORDER — PROPOFOL 10 MG/ML IV BOLUS
INTRAVENOUS | Status: DC | PRN
  Administered 2023-10-12: 100 mg via INTRAVENOUS

## 2023-10-12 MED ORDER — DEXMEDETOMIDINE HCL IN NACL 400 MCG/100ML IV SOLN
0.0000 ug/kg/h | INTRAVENOUS | Status: DC
  Administered 2023-10-12: 0.4 ug/kg/h via INTRAVENOUS
  Filled 2023-10-12: qty 100

## 2023-10-12 MED ORDER — MIDAZOLAM HCL (PF) 10 MG/2ML IJ SOLN
INTRAMUSCULAR | Status: AC
  Filled 2023-10-12: qty 2

## 2023-10-12 MED ORDER — HEPARIN SODIUM (PORCINE) 1000 UNIT/ML IJ SOLN
INTRAMUSCULAR | Status: AC
  Filled 2023-10-12: qty 1

## 2023-10-12 MED ORDER — METOPROLOL TARTRATE 12.5 MG HALF TABLET
12.5000 mg | ORAL_TABLET | Freq: Two times a day (BID) | ORAL | Status: DC
Start: 2023-10-12 — End: 2023-10-17
  Administered 2023-10-13 – 2023-10-16 (×7): 12.5 mg via ORAL
  Filled 2023-10-12 (×7): qty 1

## 2023-10-12 MED ORDER — SODIUM CHLORIDE 0.9% FLUSH
3.0000 mL | INTRAVENOUS | Status: DC | PRN
Start: 2023-10-13 — End: 2023-10-18

## 2023-10-12 MED ORDER — NITROGLYCERIN IN D5W 200-5 MCG/ML-% IV SOLN: 0.0000 ug/min | INTRAVENOUS | Status: DC

## 2023-10-12 MED ORDER — METOCLOPRAMIDE HCL 5 MG/ML IJ SOLN
10.0000 mg | Freq: Four times a day (QID) | INTRAMUSCULAR | Status: AC
  Administered 2023-10-12 – 2023-10-13 (×6): 10 mg via INTRAVENOUS
  Filled 2023-10-12 (×6): qty 2

## 2023-10-12 MED ORDER — DIPHENHYDRAMINE HCL 50 MG/ML IJ SOLN
INTRAMUSCULAR | Status: AC
  Filled 2023-10-12: qty 1

## 2023-10-12 MED ORDER — MIDAZOLAM HCL (PF) 5 MG/ML IJ SOLN
INTRAMUSCULAR | Status: DC | PRN
  Administered 2023-10-12: 2 mg via INTRAVENOUS
  Administered 2023-10-12: 1 mg via INTRAVENOUS
  Administered 2023-10-12: 3 mg via INTRAVENOUS
  Administered 2023-10-12 (×2): 2 mg via INTRAVENOUS

## 2023-10-12 MED ORDER — MAGNESIUM SULFATE 4 GM/100ML IV SOLN
4.0000 g | Freq: Once | INTRAVENOUS | Status: AC
  Administered 2023-10-12: 4 g via INTRAVENOUS
  Filled 2023-10-12: qty 100

## 2023-10-12 MED ORDER — ALBUMIN HUMAN 5 % IV SOLN
250.0000 mL | INTRAVENOUS | Status: DC | PRN
  Administered 2023-10-12 (×4): 12.5 g via INTRAVENOUS
  Filled 2023-10-12 (×2): qty 250

## 2023-10-12 MED ORDER — ACETAMINOPHEN 160 MG/5ML PO SOLN
650.0000 mg | Freq: Once | ORAL | Status: AC
  Administered 2023-10-12: 650 mg
  Filled 2023-10-12: qty 20.3

## 2023-10-12 MED ORDER — BISACODYL 10 MG RE SUPP: 10.0000 mg | Freq: Every day | RECTAL | Status: DC

## 2023-10-12 MED ORDER — PHENYLEPHRINE HCL-NACL 20-0.9 MG/250ML-% IV SOLN: 0.0000 ug/min | INTRAVENOUS | Status: DC

## 2023-10-12 MED ORDER — ONDANSETRON HCL 4 MG/2ML IJ SOLN
4.0000 mg | Freq: Four times a day (QID) | INTRAMUSCULAR | Status: DC | PRN
Start: 2023-10-12 — End: 2023-10-18
  Administered 2023-10-13 – 2023-10-17 (×11): 4 mg via INTRAVENOUS
  Filled 2023-10-12 (×11): qty 2

## 2023-10-12 MED ORDER — ROCURONIUM BROMIDE 10 MG/ML (PF) SYRINGE
PREFILLED_SYRINGE | INTRAVENOUS | Status: DC | PRN
  Administered 2023-10-12 (×3): 30 mg via INTRAVENOUS
  Administered 2023-10-12: 40 mg via INTRAVENOUS
  Administered 2023-10-12: 60 mg via INTRAVENOUS

## 2023-10-12 MED ORDER — NICARDIPINE HCL IN NACL 20-0.86 MG/200ML-% IV SOLN: 0.0000 mg/h | INTRAVENOUS | Status: DC

## 2023-10-12 MED ORDER — ORAL CARE MOUTH RINSE: 15.0000 mL | OROMUCOSAL | Status: DC | PRN

## 2023-10-12 MED ORDER — SODIUM BICARBONATE 8.4 % IV SOLN
50.0000 meq | Freq: Once | INTRAVENOUS | Status: AC
  Administered 2023-10-12: 50 meq via INTRAVENOUS

## 2023-10-12 MED ORDER — OXYCODONE HCL 5 MG PO TABS
5.0000 mg | ORAL_TABLET | ORAL | Status: DC | PRN
Start: 2023-10-12 — End: 2023-10-18
  Administered 2023-10-13 – 2023-10-14 (×3): 5 mg via ORAL
  Filled 2023-10-12 (×3): qty 1

## 2023-10-12 MED ORDER — PROTAMINE SULFATE 10 MG/ML IV SOLN
INTRAVENOUS | Status: AC
  Filled 2023-10-12: qty 25

## 2023-10-12 MED ORDER — MORPHINE SULFATE (PF) 2 MG/ML IV SOLN
1.0000 mg | INTRAVENOUS | Status: DC | PRN
  Administered 2023-10-12 – 2023-10-13 (×5): 2 mg via INTRAVENOUS
  Filled 2023-10-12 (×2): qty 1
  Filled 2023-10-12: qty 2
  Filled 2023-10-12: qty 1

## 2023-10-12 MED ORDER — CEFAZOLIN SODIUM-DEXTROSE 2-4 GM/100ML-% IV SOLN
2.0000 g | Freq: Three times a day (TID) | INTRAVENOUS | Status: AC
  Administered 2023-10-12 – 2023-10-14 (×6): 2 g via INTRAVENOUS
  Filled 2023-10-12 (×6): qty 100

## 2023-10-12 MED ORDER — DOCUSATE SODIUM 100 MG PO CAPS
200.0000 mg | ORAL_CAPSULE | Freq: Every day | ORAL | Status: DC
  Administered 2023-10-13 – 2023-10-17 (×5): 200 mg via ORAL
  Filled 2023-10-12 (×6): qty 2

## 2023-10-12 MED ORDER — SODIUM CHLORIDE 0.9% FLUSH
3.0000 mL | Freq: Two times a day (BID) | INTRAVENOUS | Status: DC
  Administered 2023-10-13 – 2023-10-18 (×11): 3 mL via INTRAVENOUS

## 2023-10-12 MED ORDER — SODIUM CHLORIDE (PF) 0.9 % IJ SOLN: OROMUCOSAL | Status: DC | PRN

## 2023-10-12 MED ORDER — ORAL CARE MOUTH RINSE
15.0000 mL | OROMUCOSAL | Status: DC
  Administered 2023-10-12: 15 mL via OROMUCOSAL

## 2023-10-12 MED ORDER — PANTOPRAZOLE SODIUM 40 MG IV SOLR
40.0000 mg | Freq: Every day | INTRAVENOUS | Status: AC
  Administered 2023-10-12 – 2023-10-13 (×2): 40 mg via INTRAVENOUS
  Filled 2023-10-12 (×2): qty 10

## 2023-10-12 MED ORDER — BISACODYL 5 MG PO TBEC
10.0000 mg | DELAYED_RELEASE_TABLET | Freq: Every day | ORAL | Status: DC
  Administered 2023-10-13 – 2023-10-17 (×5): 10 mg via ORAL
  Filled 2023-10-12 (×5): qty 2

## 2023-10-12 MED ORDER — HEPARIN SODIUM (PORCINE) 1000 UNIT/ML IJ SOLN
INTRAMUSCULAR | Status: DC | PRN
  Administered 2023-10-12: 5000 [IU] via INTRAVENOUS
  Administered 2023-10-12: 30000 [IU] via INTRAVENOUS

## 2023-10-12 MED ORDER — FAMOTIDINE 20 MG PO TABS: 20.0000 mg | ORAL_TABLET | Freq: Two times a day (BID) | ORAL | Status: DC

## 2023-10-12 MED ORDER — SODIUM BICARBONATE 8.4 % IV SOLN: 50.0000 meq | Freq: Once | INTRAVENOUS | Status: AC

## 2023-10-12 MED ORDER — FENTANYL CITRATE (PF) 250 MCG/5ML IJ SOLN
INTRAMUSCULAR | Status: DC | PRN
  Administered 2023-10-12 (×2): 250 ug via INTRAVENOUS
  Administered 2023-10-12: 225 ug via INTRAVENOUS
  Administered 2023-10-12: 100 ug via INTRAVENOUS
  Administered 2023-10-12: 150 ug via INTRAVENOUS
  Administered 2023-10-12: 25 ug via INTRAVENOUS

## 2023-10-12 MED ORDER — INSULIN REGULAR(HUMAN) IN NACL 100-0.9 UT/100ML-% IV SOLN
INTRAVENOUS | Status: DC
Start: 2023-10-12 — End: 2023-10-14

## 2023-10-12 MED ORDER — SODIUM CHLORIDE 0.9 % IV SOLN: INTRAVENOUS | Status: DC

## 2023-10-12 MED ORDER — PHENYLEPHRINE 80 MCG/ML (10ML) SYRINGE FOR IV PUSH (FOR BLOOD PRESSURE SUPPORT)
PREFILLED_SYRINGE | INTRAVENOUS | Status: AC
  Filled 2023-10-12: qty 10

## 2023-10-12 MED ORDER — SODIUM CHLORIDE 0.9 % IV SOLN
INTRAVENOUS | Status: DC | PRN
Start: 2023-10-12 — End: 2023-10-12

## 2023-10-12 MED ORDER — DOCUSATE SODIUM 100 MG PO CAPS: 200.0000 mg | ORAL_CAPSULE | Freq: Every day | ORAL | Status: DC

## 2023-10-12 MED ORDER — ALBUMIN HUMAN 5 % IV SOLN: INTRAVENOUS | Status: DC | PRN

## 2023-10-12 MED ORDER — LACTATED RINGERS IV SOLN: INTRAVENOUS | Status: DC

## 2023-10-12 MED ORDER — GLYCOPYRROLATE 0.2 MG/ML IJ SOLN
INTRAMUSCULAR | Status: DC | PRN
  Administered 2023-10-12: .2 mg via INTRAVENOUS

## 2023-10-12 MED ORDER — LACTATED RINGERS IV SOLN: INTRAVENOUS | Status: DC | PRN

## 2023-10-12 MED ORDER — SODIUM BICARBONATE 8.4 % IV SOLN
INTRAVENOUS | Status: AC
  Administered 2023-10-12: 50 meq via INTRAVENOUS
  Filled 2023-10-12: qty 50

## 2023-10-12 MED ORDER — DOBUTAMINE-DEXTROSE 4-5 MG/ML-% IV SOLN: 0.0000 ug/kg/min | INTRAVENOUS | Status: DC

## 2023-10-12 SURGICAL SUPPLY — 88 items
ADH SKN CLS APL DERMABOND .7 (GAUZE/BANDAGES/DRESSINGS) ×2
BAG DECANTER FOR FLEXI CONT (MISCELLANEOUS) ×3 IMPLANT
BLADE CLIPPER SURG (BLADE) ×3 IMPLANT
BLADE STERNUM SYSTEM 6 (BLADE) ×3 IMPLANT
BNDG CMPR 5X4 KNIT ELC UNQ LF (GAUZE/BANDAGES/DRESSINGS) ×2
BNDG CMPR 6 X 5 YARDS HK CLSR (GAUZE/BANDAGES/DRESSINGS) ×2
BNDG ELASTIC 4INX 5YD STR LF (GAUZE/BANDAGES/DRESSINGS) IMPLANT
BNDG ELASTIC 4X5.8 VLCR STR LF (GAUZE/BANDAGES/DRESSINGS) ×3 IMPLANT
BNDG ELASTIC 6INX 5YD STR LF (GAUZE/BANDAGES/DRESSINGS) IMPLANT
BNDG ELASTIC 6X5.8 VLCR STR LF (GAUZE/BANDAGES/DRESSINGS) ×3 IMPLANT
BNDG GAUZE DERMACEA FLUFF 4 (GAUZE/BANDAGES/DRESSINGS) ×3 IMPLANT
BNDG GZE DERMACEA 4 6PLY (GAUZE/BANDAGES/DRESSINGS) ×2
CABLE SURGICAL S-101-97-12 (CABLE) ×3 IMPLANT
CANISTER SUCT 3000ML PPV (MISCELLANEOUS) ×3 IMPLANT
CANNULA MC2 2 STG 29/37 NON-V (CANNULA) ×3 IMPLANT
CANNULA NON VENT 20FR 12 (CANNULA) ×3 IMPLANT
CANNULA VEN 2 STAGE (MISCELLANEOUS) IMPLANT
CATH ROBINSON RED A/P 18FR (CATHETERS) ×6 IMPLANT
CLIP RETRACTION 3.0MM CORONARY (MISCELLANEOUS) IMPLANT
CLIP TI MEDIUM 24 (CLIP) IMPLANT
CLIP TI WIDE RED SMALL 24 (CLIP) IMPLANT
CONN ST 1/2X1/2 BEN (MISCELLANEOUS) ×3 IMPLANT
CONNECTOR BLAKE 2:1 CARIO BLK (MISCELLANEOUS) ×3 IMPLANT
CONTAINER PROTECT SURGISLUSH (MISCELLANEOUS) ×6 IMPLANT
DERMABOND ADVANCED .7 DNX12 (GAUZE/BANDAGES/DRESSINGS) IMPLANT
DRAIN CHANNEL 19F RND (DRAIN) ×9 IMPLANT
DRAIN CONNECTOR BLAKE 1:1 (MISCELLANEOUS) ×3 IMPLANT
DRAPE CARDIOVASCULAR INCISE (DRAPES) ×2
DRAPE INCISE IOBAN 66X45 STRL (DRAPES) IMPLANT
DRAPE SRG 135X102X78XABS (DRAPES) ×3 IMPLANT
DRAPE WARM FLUID 44X44 (DRAPES) ×3 IMPLANT
DRSG AQUACEL AG ADV 3.5X10 (GAUZE/BANDAGES/DRESSINGS) ×3 IMPLANT
ELECT BLADE 4.0 EZ CLEAN MEGAD (MISCELLANEOUS) ×2
ELECT REM PT RETURN 9FT ADLT (ELECTROSURGICAL) ×4
ELECTRODE BLDE 4.0 EZ CLN MEGD (MISCELLANEOUS) ×3 IMPLANT
ELECTRODE REM PT RTRN 9FT ADLT (ELECTROSURGICAL) ×6 IMPLANT
FELT TEFLON 1X6 (MISCELLANEOUS) ×6 IMPLANT
GAUZE 4X4 16PLY ~~LOC~~+RFID DBL (SPONGE) ×3 IMPLANT
GAUZE SPONGE 4X4 12PLY STRL (GAUZE/BANDAGES/DRESSINGS) ×6 IMPLANT
GLOVE BIO SURGEON STRL SZ7 (GLOVE) ×6 IMPLANT
GLOVE BIOGEL M STRL SZ7.5 (GLOVE) ×6 IMPLANT
GOWN STRL REUS W/ TWL LRG LVL3 (GOWN DISPOSABLE) ×12 IMPLANT
GOWN STRL REUS W/ TWL XL LVL3 (GOWN DISPOSABLE) ×6 IMPLANT
GOWN STRL REUS W/TWL LRG LVL3 (GOWN DISPOSABLE) ×10
GOWN STRL REUS W/TWL XL LVL3 (GOWN DISPOSABLE) ×4
HEMOSTAT POWDER SURGIFOAM 1G (HEMOSTASIS) ×6 IMPLANT
INSERT SUTURE HOLDER (MISCELLANEOUS) ×3 IMPLANT
KIT BASIN OR (CUSTOM PROCEDURE TRAY) ×3 IMPLANT
KIT SUCTION CATH 14FR (SUCTIONS) ×3 IMPLANT
KIT TURNOVER KIT B (KITS) ×3 IMPLANT
KIT VASOVIEW HEMOPRO 2 VH 4000 (KITS) ×3 IMPLANT
LEAD PACING MYOCARDI (MISCELLANEOUS) ×3 IMPLANT
MARKER GRAFT CORONARY BYPASS (MISCELLANEOUS) ×9 IMPLANT
NS IRRIG 1000ML POUR BTL (IV SOLUTION) ×15 IMPLANT
PACK E OPEN HEART (SUTURE) ×3 IMPLANT
PACK OPEN HEART (CUSTOM PROCEDURE TRAY) ×3 IMPLANT
PAD ARMBOARD 7.5X6 YLW CONV (MISCELLANEOUS) ×6 IMPLANT
PAD ELECT DEFIB RADIOL ZOLL (MISCELLANEOUS) ×3 IMPLANT
PENCIL BUTTON HOLSTER BLD 10FT (ELECTRODE) ×3 IMPLANT
POSITIONER HEAD DONUT 9IN (MISCELLANEOUS) ×3 IMPLANT
PUNCH AORTIC ROTATE 4.0MM (MISCELLANEOUS) ×3 IMPLANT
SET MPS 3-ND DEL (MISCELLANEOUS) IMPLANT
SPONGE T-LAP 18X18 ~~LOC~~+RFID (SPONGE) ×12 IMPLANT
STOPCOCK 4 WAY LG BORE MALE ST (IV SETS) IMPLANT
SUPPORT HEART JANKE-BARRON (MISCELLANEOUS) ×3 IMPLANT
SUT BONE WAX W31G (SUTURE) ×3 IMPLANT
SUT ETHIBOND X763 2 0 SH 1 (SUTURE) ×6 IMPLANT
SUT MNCRL AB 3-0 PS2 18 (SUTURE) ×6 IMPLANT
SUT PDS AB 1 CTX 36 (SUTURE) ×6 IMPLANT
SUT PROLENE 4 0 SH DA (SUTURE) ×3 IMPLANT
SUT PROLENE 5 0 C 1 36 (SUTURE) ×9 IMPLANT
SUT PROLENE 7 0 BV 1 (SUTURE) IMPLANT
SUT PROLENE 7 0 BV1 MDA (SUTURE) ×3 IMPLANT
SUT STEEL 6MS V (SUTURE) ×6 IMPLANT
SUT STEEL STERNAL CCS#1 18IN (SUTURE) IMPLANT
SUT VIC AB 2-0 CT1 27 (SUTURE) ×2
SUT VIC AB 2-0 CT1 TAPERPNT 27 (SUTURE) IMPLANT
SUT VICRYL 3-0 27 CRC X-1 (SUTURE) IMPLANT
SYSTEM SAHARA CHEST DRAIN ATS (WOUND CARE) ×3 IMPLANT
TAPE CLOTH 4X10 WHT NS (GAUZE/BANDAGES/DRESSINGS) IMPLANT
TAPE PAPER 2X10 WHT MICROPORE (GAUZE/BANDAGES/DRESSINGS) IMPLANT
TOWEL GREEN STERILE (TOWEL DISPOSABLE) ×3 IMPLANT
TOWEL GREEN STERILE FF (TOWEL DISPOSABLE) ×3 IMPLANT
TRAY FOLEY SLVR 16FR TEMP STAT (SET/KITS/TRAYS/PACK) ×3 IMPLANT
TUBE CONNECTING 12X1/4 (SUCTIONS) IMPLANT
TUBING LAP HI FLOW INSUFFLATIO (TUBING) ×3 IMPLANT
UNDERPAD 30X36 HEAVY ABSORB (UNDERPADS AND DIAPERS) ×3 IMPLANT
WATER STERILE IRR 1000ML POUR (IV SOLUTION) ×6 IMPLANT

## 2023-10-12 NOTE — Brief Op Note (Signed)
10/10/2023 - 10/12/2023  12:51 PM  PATIENT:  Leroy Proctor  72 y.o. male  PRE-OPERATIVE DIAGNOSIS:  CORONARY ARTERY DISEASE  POST-OPERATIVE DIAGNOSIS:  CORONARY ARTERY DISEASE  PROCEDURE:  Procedure(s): CORONARY ARTERY BYPASS GRAFTING (CABG) X THREE, USING LEFT INTERNAL MAMMARY ARTERY AND RIGHT GREATER SAPHENEOUS VEIN HARVESTED ENDOSCOPICLY (N/A) TRANSESOPHAGEAL ECHOCARDIOGRAM (N/A) LIMA-LAD, SVG-OM, SVG-PD  Vein harvest time: Vein prep time:  SURGEON:  Surgeons and Role:    * Lightfoot, Eliezer Lofts, MD - Primary  PHYSICIAN ASSISTANT: Alanah Sakuma PA-C  ASSISTANTS: Virgilio Frees RNFA   ANESTHESIA:   general  EBL:  915 mL   BLOOD ADMINISTERED:none  DRAINS:  MEDIASTINAL AND LEFT PLEURAL DRAINS    LOCAL MEDICATIONS USED:  NONE  SPECIMEN:  No Specimen  DISPOSITION OF SPECIMEN:  N/A  COUNTS:  YES  TOURNIQUET:  * No tourniquets in log *  DICTATION: .Dragon Dictation  PLAN OF CARE: Admit to inpatient   PATIENT DISPOSITION:  ICU - intubated and hemodynamically stable.   Delay start of Pharmacological VTE agent (>24hrs) due to surgical blood loss or risk of bleeding: yes  COMPLICATIONS: NO KNOWN

## 2023-10-12 NOTE — Anesthesia Procedure Notes (Signed)
Procedure Name: Intubation Date/Time: 10/12/2023 7:41 AM  Performed by: Marena Chancy, CRNAPre-anesthesia Checklist: Patient identified, Emergency Drugs available, Suction available and Patient being monitored Patient Re-evaluated:Patient Re-evaluated prior to induction Oxygen Delivery Method: Circle System Utilized Preoxygenation: Pre-oxygenation with 100% oxygen Induction Type: IV induction, Rapid sequence and Cricoid Pressure applied Laryngoscope Size: Miller and 2 Grade View: Grade I Tube type: Oral Tube size: 8.0 mm Number of attempts: 1 Airway Equipment and Method: Stylet and Oral airway Placement Confirmation: ETT inserted through vocal cords under direct vision, positive ETCO2 and breath sounds checked- equal and bilateral Tube secured with: Tape Dental Injury: Teeth and Oropharynx as per pre-operative assessment

## 2023-10-12 NOTE — Progress Notes (Signed)
  Echocardiogram Echocardiogram Transesophageal has been performed.  Delcie Roch 10/12/2023, 8:46 AM

## 2023-10-12 NOTE — Discharge Summary (Signed)
[address]       [place] [address]             [phone]    Physician Discharge Summary  Patient ID: Leroy Proctor MRN: [id number] DOB/AGE: June 29, 1951 72 y.o.  Admit date: 10/10/2023 Discharge date: 10/18/2023  Admission Diagnoses:  Patient Active Problem List   Diagnosis Date Noted   S/P CABG x 3 10/12/2023   Endotracheally intubated 10/12/2023   ABLA (acute blood loss anemia) 10/12/2023   Prediabetes 10/11/2023   Primary hypercholesterolemia 10/11/2023   Progressive angina (HCC) 10/10/2023   CAD, triple-vessel disease with calcium score 1624, CAD RADS 4 study, severe stenosis in LAD, moderate stenosis in LCx and RCA, hemodynamically significant by CT FFR in all 3 territories 10/05/2023   Chest pain of uncertain etiology 09/19/2023   Left bundle branch block 09/19/2023   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   Allergy    Arthritis    Cataract    Chronic renal insufficiency, stage II (mild)    GERD (gastroesophageal reflux disease)    Herpes zoster    Personal history of kidney stones    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   Health maintenance examination 10/16/2014   Impaired fasting glucose 10/16/2014   Recurrent herpes labialis 06/30/2014   Cold sore 06/03/2014   Routine general medical examination at a health care facility 10/02/2013   Bradycardia 10/02/2013   Erectile dysfunction 08/27/2012   HTN (hypertension) 11/21/2011   Hyperlipidemia 11/21/2011   Kidney stones 11/21/2011     Discharge Diagnoses:  Patient Active Problem List   Diagnosis Date Noted   S/P CABG x 3 10/12/2023   Endotracheally intubated 10/12/2023   ABLA (acute blood loss anemia) 10/12/2023   Prediabetes 10/11/2023   Primary hypercholesterolemia 10/11/2023   Progressive angina (HCC) 10/10/2023   CAD, triple-vessel disease with calcium score 1624, CAD RADS 4 study, severe stenosis in LAD, moderate stenosis in LCx and  RCA, hemodynamically significant by CT FFR in all 3 territories 10/05/2023   Chest pain of uncertain etiology 09/19/2023   Left bundle branch block 09/19/2023   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   Allergy    Arthritis    Cataract    Chronic renal insufficiency, stage II (mild)    GERD (gastroesophageal reflux disease)    Herpes zoster    Personal history of kidney stones    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   Health maintenance examination 10/16/2014   Impaired fasting glucose 10/16/2014   Recurrent herpes labialis 06/30/2014   Cold sore 06/03/2014   Routine general medical examination at a health care facility 10/02/2013   Bradycardia 10/02/2013   Erectile dysfunction 08/27/2012   HTN (hypertension) 11/21/2011   Hyperlipidemia 11/21/2011   Kidney stones 11/21/2011     Discharged Condition: good  Referring: Marykay Lex, MD Primary Care: Esperanza Richters, PA-C Primary Cardiologist:None    History of Present Illness: At time of CT surgical consultation  We are asked to see this 72 year old male in CT surgical consultation for consideration of coronary artery surgical revascularization.  The patient has multiple cardiac risk factors including hypertension,  hyperlipidemia and history of tobacco abuse.  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  The patient had an abnormal CT coronary angiogram in October of this year following a visit to the emergency room where he presented with chest discomfort.  He reports that  he does workout on a regular basis at the [facility] without significant symptoms normally, however does have heaviness in his chest at times even at rest.  He has noticed recently during workouts that his blood pressure has been much lower than normal for him.  The CTA showed hemodynamically significant findings by FFR.  He had a significantly  elevated calcium score of 1624 which was 90th centile for age.  It was felt to be consistent with four-vessel significant stenosis.  Due to these findings he was felt to require cardiac catheterization which was done today and he was found to have confirmation of the three-vessel severe disease.  Echocardiogram revealed no significant valvular disease.  EF was 60 to 65%.  Hospital course:  Following diagnostic evaluation and medical stabilization the patient was felt to be stable to proceed for surgery on 10/12/2023.  Was taken the operating room at which time he underwent CABG x 3 by Dr. Cliffton Asters.  A LIMA-LAD, SVG-OM, and SVG-PD grafts were placed.  Patient tolerated the procedure well was taken to the surgical intensive care unit in stable condition.  He was extubated the evening of surgery.  He required support with neo-synephrine which was weaned off on the first postoperative day.  Diuresis was begun while in the ICU.  He was transferred to [facility] on postop day 2.  He developed atrial fibrillation in the evening on post-op day 2. Ventricular rate for the atrial fibrillation was in the 80-110 range and is improved over time.  He was started on IV amiodarone loading initially was transition to oral.  He is now maintaining sinus with occasional PVCs and a known left bundle branch block.  His blood pressure has been somewhat difficult to control but it has improved over time with initiation of losartan and resumption of Norvasc.  He is tolerating gradually increasing activity using standard cardiac rehab modalities.  Incisions are healing well without evidence of infection.  He is tolerating diet.  Oxygen was weaned and he maintains good saturations on room air.  He does have an expected acute blood loss anemia which is stable.  He did have some expected volume excess related to surgery and not congestive heart failure which responded well to diuretics.  These have subsequently been discontinued as  he is euvolemic.  Overall, at the time of discharge the patient is felt to be quite stable.  Consults: pulmonary/intensive care  Significant Diagnostic Studies:  CT CORONARY MORPH W/CTA COR W/SCORE W/CA W/CM &/OR WO/CM  Addendum Date: 10/17/2023   ADDENDUM REPORT: 10/17/2023 09:25 EXAM: OVER-READ INTERPRETATION  CT CHEST The following report is an over-read performed by radiologist Dr. Narda Rutherford of [facility] on 10/17/2023. This over-read does not include interpretation of cardiac or coronary anatomy or pathology. The coronary CTA interpretation by the cardiologist is attached. COMPARISON:  None. FINDINGS: Vascular: Aortic atherosclerosis. The included aorta is normal in caliber. Mediastinum/nodes: Calcified mediastinal lymph nodes. No definite noncalcified adenopathy. Small hiatal hernia. Lungs: Mild emphysema. No focal airspace disease. 3 mm perifissural nodule in the right lower lobe series 11, image 13. 4 x 2 mm left upper lobe nodule (3 mm mean) series 11, image 8. Benign calcified granulomas in the right middle and left lower lobes. No pleural fluid. The included airways are patent. Upper abdomen: No acute or unexpected findings. Musculoskeletal: There are no acute or suspicious osseous abnormalities. IMPRESSION: 1. Small pulmonary nodules measuring up to 3 mm. No follow-up needed if patient is low-risk (and  has no known or suspected primary neoplasm). Non-contrast chest CT can be considered in 12 months if patient is high-risk. This recommendation follows the consensus statement: Guidelines for Management of Incidental Pulmonary Nodules Detected on CT Images: From the Fleischner Society 2017; Radiology 2017; 284:228-243. 2. Small hiatal hernia. Aortic Atherosclerosis (ICD10-I70.0) and Emphysema (ICD10-J43.9). Electronically Signed   By: Narda Rutherford M.D.   On: 10/17/2023 09:25   Result Date: 10/17/2023 CLINICAL DATA:  72 Year-old Male EXAM: Cardiac/Coronary  CTA TECHNIQUE:  The patient was scanned on a [employer]. FINDINGS: Scan was triggered in the descending thoracic aorta. Axial non-contrast 3 mm slices were carried out through the heart. The data set was analyzed on a dedicated work station and scored using the Agatson method. Gantry rotation speed was 250 msecs and collimation was .6 mm. 0.8 mg of sl NTG was given. The 3D data set was reconstructed in 5% intervals of the 67-82 % of the R-R cycle. Diastolic phases were analyzed on a dedicated work station using MPR, MIP and VRT modes. The patient received 95 cc of contrast. Coronary Arteries:  Normal coronary origin.  Right dominance. Coronary Calcium Score: Left main: 131 Left anterior descending artery: 698 Left circumflex artery: 312 Right coronary artery: 342 Total: 1624 Percentile: 90th for age, sex, and race matched control. RCA is a large dominant artery that gives rise to PDA and PLA. Moderate mixed plaque stenosis (50-70%) in ostial RCA with low attenuation plaque. Two moderate mixed stenoses (50-70%) in the proximal RCA. Mild non-obstructive mixed plaques (25-49%) in the mid and distal RCA. Left main is a large artery that gives rise to LAD and LCX arteries. Minimal non-obstructive calcified plaque (1-24%) in ostial LAD. LAD is a large vessel that gives rise to three larger diagonal vessels. Severe mixed plaque stenosis (70-99%) in the proximal LAD with low attenuation plaque. Moderate mixed stenosis (50-70%) in mid LAD at the level of the small D2 stake off. Moderate soft plaque stenosis (50-70%) in distal LAD at the take off of a small caliber diagonal. LCX is a non-dominant artery that gives rise to one large OM1 branch. Moderate mixed plaque stenoses (50-70%) in proximal and mix LCX with low attenuation plaque. Mild non-obstructive soft plaque (25-49%) in mid distal LCX. Other findings: Aorta: Normal size. Aortic atherosclerosis with ascending aortic soft plaque. No dissection. Main Pulmonary Artery:  Normal size of the pulmonary artery. Systemic Veins: Normal drainage Aortic Valve:  Tri-leaflet.  No calcifications. Mitral valve: No calcifications. Normal variant pulmonary vein drainage into the left atrium, right middle pulmonary vein. Normal left atrial appendage without a thrombus. Interatrial septum with no communications Left Ventricle: Normal size Left Atrium: Normal size Right Ventricle: Dilated Right Atrium: Normal size Pericardium: Normal thickness Extra-cardiac findings: See attached radiology report for non-cardiac structures. Artifact: Moderate slab artifact IMPRESSION: 1. Coronary calcium score of 1624. This was 90th percentile for age, sex, and race matched control. 2. Normal coronary origin with right dominance. 3. CAD-RADS 4 Severe stenosis. (70-99% or > 50% left main). Cardiac catheterization or CT FFR is recommended. Consider symptom-guided anti-ischemic pharmacotherapy as well as risk factor modification per guideline directed care. RECOMMENDATIONS: RECOMMENDATIONS The proposed cut-off value of 1,651 AU yielded a 93 % sensitivity and 75 % specificity in grading AS severity in patients with classical low-flow, low-gradient AS. Proposed different cut-off values to define severe AS for men and women as 2,065 AU and 1,274 AU, respectively. The joint European and American recommendations for the assessment of AS  consider the aortic valve calcium score as a continuum - a very high calcium score suggests severe AS and a low calcium score suggests severe AS is unlikely. Sunday Shams, et al. 2017 ESC/EACTS Guidelines for the management of valvular heart disease. Eur Heart J 2017;38:2739-91. Coronary artery calcium (CAC) score is a strong predictor of incident coronary heart disease (CHD) and provides predictive information beyond traditional risk factors. CAC scoring is reasonable to use in the decision to withhold, postpone, or initiate statin therapy in intermediate-risk or selected  borderline-risk asymptomatic adults (age 53-75 years and LDL-C >=70 to <190 mg/dL) who do not have diabetes or established atherosclerotic cardiovascular disease (ASCVD).* In intermediate-risk (10-year ASCVD risk >=7.5% to <20%) adults or selected borderline-risk (10-year ASCVD risk >=5% to <7.5%) adults in whom a CAC score is measured for the purpose of making a treatment decision the following recommendations have been made: If CAC = 0, it is reasonable to withhold statin therapy and reassess in 5 to 10 years, as long as higher risk conditions are absent (diabetes mellitus, family history of premature CHD in first degree relatives (males <55 years; females <65 years), cigarette smoking, LDL >=190 mg/dL or other independent risk factors). If CAC is 1 to 99, it is reasonable to initiate statin therapy for patients >=80 years of age. If CAC is >=100 or >=75th percentile, it is reasonable to initiate statin therapy at any age. Cardiology referral should be considered for patients with CAC scores =400 or >=75th percentile. *2018 AHA/ACC/AACVPR/AAPA/ABC/ACPM/ADA/AGS/APhA/ASPC/NLA/PCNA Guideline on the Management of Blood Cholesterol: A Report of the American College of Cardiology/American Heart Association Task Force on Clinical Practice Guidelines. J Am Coll Cardiol. 2019;73(24):3168-3209. Riley Lam, MD Electronically Signed: By: Riley Lam M.D. On: 09/21/2023 19:45   DG Chest Port 1 View  Result Date: 10/14/2023 CLINICAL DATA:  Postoperative status. EXAM: PORTABLE CHEST 1 VIEW COMPARISON:  October 13, 2023. FINDINGS: Stable cardiomegaly. Status post coronary bypass graft. Right internal jugular catheter is unchanged. Left-sided chest tube is noted without pneumothorax. Minimal bibasilar subsegmental atelectasis is noted with probable small left pleural effusion. IMPRESSION: Left-sided chest tube is noted without pneumothorax. Minimal bibasilar subsegmental atelectasis with probable small  left pleural effusion. Electronically Signed   By: Lupita Raider M.D.   On: 10/14/2023 09:59   DG Chest Port 1 View  Result Date: 10/13/2023 CLINICAL DATA:  Status post coronary artery bypass graft. EXAM: PORTABLE CHEST 1 VIEW COMPARISON:  October 12, 2023. FINDINGS: Endotracheal and nasogastric tubes have been removed. Stable left-sided chest without pneumothorax. Mild central pulmonary vascular congestion and possible pulmonary edema is noted. Mild bibasilar subsegmental atelectasis is noted with possible small left pleural effusion. Sternal jugular catheter is unchanged. Bony thorax is unremarkable. IMPRESSION: Mild central pulmonary vascular congestion and possible pulmonary edema. Mild bibasilar subsegmental atelectasis with possible small left pleural effusion. Left-sided chest tube is noted without definite pneumothorax. Electronically Signed   By: Lupita Raider M.D.   On: 10/13/2023 10:59   ECHO INTRAOPERATIVE TEE  Result Date: 10/13/2023  *INTRAOPERATIVE TRANSESOPHAGEAL REPORT *  Patient Name:   Leroy Proctor Date of Exam: 10/12/2023 Medical Rec #:  [id number]     Height:       68.0 in Accession #:    [id number]    Weight:       195.0 lb Date of Birth:  [birth year]     BSA:          2.02 m Patient Age:  72 years      BP:           151/80 mmHg Patient Gender: M             HR:           72 bpm. Exam Location:  Inpatient Transesophogeal exam was perform intraoperatively during surgical procedure. Patient was closely monitored under general anesthesia during the entirety of examination. Indications:     coronary artery bypass surgery Sonographer:     Delcie Roch RDCS Performing Phys: [id number] Eliezer Lofts LIGHTFOOT Diagnosing Phys: Achille Rich MD Complications: No known complications during this procedure. POST-OP IMPRESSIONS Overall, there were no significant changes from pre-bypass. PRE-OP FINDINGS  Left Ventricle: The left ventricle has normal systolic function, with an ejection fraction of  60-65%. The cavity size was normal. There is no increase in left ventricular wall thickness. There is moderate concentric left ventricular hypertrophy. Right Ventricle: The right ventricle has normal systolic function. The cavity was normal. There is no increase in right ventricular wall thickness. Left Atrium: Left atrial size was normal in size. No left atrial/left atrial appendage thrombus was detected. Right Atrium: Right atrial size was normal in size. Interatrial Septum: No atrial level shunt detected by color flow Doppler. Pericardium: There is no evidence of pericardial effusion. Mitral Valve: The mitral valve is normal in structure. Mitral valve regurgitation is mild by color flow Doppler. There is No evidence of mitral stenosis. Tricuspid Valve: The tricuspid valve was normal in structure. Tricuspid valve regurgitation is trivial by color flow Doppler. No evidence of tricuspid stenosis is present. Aortic Valve: The aortic valve is normal in structure. Aortic valve regurgitation was not visualized by color flow Doppler. There is no stenosis of the aortic valve. Pulmonic Valve: The pulmonic valve was normal in structure. Pulmonic valve regurgitation is not visualized by color flow Doppler. Aorta: There is evidence of plaque in the descending aorta; Grade I, measuring 1-39mm in size.  Achille Rich MD Electronically signed by Achille Rich MD Signature Date/Time: 10/13/2023/10:08:43 AM    Final    DG Chest Port 1 View  Result Date: 10/12/2023 CLINICAL DATA:  Status post CABG EXAM: PORTABLE CHEST 1 VIEW COMPARISON:  Chest radiograph 1 day prior FINDINGS: There has been interval median sternotomy and CABG. The endotracheal tube tip is proximally 4.6 cm from the carina. The right IJ vascular catheter is in the mid SVC. The enteric catheter tip is off the field of view. A presumed mediastinal drain and left chest tube are noted. The heart is at the upper limits of normal for size. The upper mediastinal contours  are stable. There is vascular congestion and possible mild pulmonary interstitial edema. There is probable left basilar subsegmental atelectasis. There is no significant effusion. There is no pneumothorax. There is no acute osseous abnormality. IMPRESSION: 1. Interval median sternotomy and CABG with support apparatus as above. 2. Vascular congestion and possible mild pulmonary interstitial edema, and probable left basilar subsegmental atelectasis. Electronically Signed   By: Lesia Hausen M.D.   On: 10/12/2023 15:21   EP STUDY  Result Date: 10/12/2023 See surgical note for result.  DG Chest 2 View  Result Date: 10/11/2023 CLINICAL DATA:  Medical clearance for cardiac surgery. EXAM: CHEST - 2 VIEW COMPARISON:  09/08/2023 FINDINGS: Mild eventration of the left hemidiaphragm. Lungs are clear. No pneumothorax or pleural effusion. Cardiac size within normal limits. Pulmonary vascularity is normal. No acute bone abnormality. IMPRESSION: 1. No active cardiopulmonary disease. Electronically Signed  By: Helyn Numbers M.D.   On: 10/11/2023 21:39   VAS US DOPPLER PRE CABG  Result Date: 10/11/2023 PREOPERATIVE VASCULAR EVALUATION Patient Name:  Leroy Proctor  Date of Exam:   10/11/2023 Medical Rec #: [id number]      Accession #:    [id number] Date of Birth: November 11, 1951      Patient Gender: M Patient Age:   47 years Exam Location:  [facility] Procedure:      VAS US DOPPLER PRE CABG Referring Phys: HARRELL LIGHTFOOT --------------------------------------------------------------------------------  Indications:      Pre-CABG. Risk Factors:     Hypertension, hyperlipidemia, past history of smoking,                   coronary artery disease. Comparison Study: No prior exam. Performing Technologist: Fernande Bras  Examination Guidelines: A complete evaluation includes B-mode imaging, spectral Doppler, color Doppler, and power Doppler as needed of all accessible portions of each vessel. Bilateral testing is  considered an integral part of a complete examination. Limited examinations for reoccurring indications may be performed as noted.  Right Carotid Findings: +----------+--------+--------+--------+-------------------------+--------+           PSV cm/sEDV cm/sStenosisDescribe                 Comments +----------+--------+--------+--------+-------------------------+--------+ CCA Prox  129     22                                                +----------+--------+--------+--------+-------------------------+--------+ CCA Distal163     29                                                +----------+--------+--------+--------+-------------------------+--------+ ICA Prox  176     50      40-59%  heterogenous and calcific         +----------+--------+--------+--------+-------------------------+--------+ ICA Mid   199     35              calcific                          +----------+--------+--------+--------+-------------------------+--------+ ICA Distal97      31                                                +----------+--------+--------+--------+-------------------------+--------+ ECA       347     20              calcific                          +----------+--------+--------+--------+-------------------------+--------+ +----------+--------+-------+-------------------+------------+           PSV cm/sEDV cmsDescribe           Arm Pressure +----------+--------+-------+-------------------+------------+ Subclavian287            Elevated velocities             +----------+--------+-------+-------------------+------------+ +---------+--------+---+--------+--+ VertebralPSV cm/s117EDV cm/s20 +---------+--------+---+--------+--+ Left Carotid Findings: +----------+--------+--------+--------+--------+--------+           PSV cm/sEDV  cm/sStenosisDescribeComments +----------+--------+--------+--------+--------+--------+ CCA Prox  113     18                                +----------+--------+--------+--------+--------+--------+ CCA Distal101     23                               +----------+--------+--------+--------+--------+--------+ ICA Prox  238     66      60-79%                   +----------+--------+--------+--------+--------+--------+ ICA Mid   229     50                               +----------+--------+--------+--------+--------+--------+ ICA Distal65      20                               +----------+--------+--------+--------+--------+--------+ ECA       248     27                               +----------+--------+--------+--------+--------+--------+  +----------+--------+--------+--------+------------+ SubclavianPSV cm/sEDV cm/sDescribeArm Pressure +----------+--------+--------+--------+------------+           134                                  +----------+--------+--------+--------+------------+ +---------+--------+--+--------+--+ VertebralPSV cm/s62EDV cm/s20 +---------+--------+--+--------+--+  ABI Findings: +------------------+-----+ Rt Pressure (mmHg)Index +------------------+-----+ 130                     +------------------+-----+ 92                0.71  +------------------+-----+ 91                0.70  +------------------+-----+ 107               0.82  +------------------+-----+ +------------------+-----+ Lt Pressure (mmHg)Index +------------------+-----+ 124                     +------------------+-----+ 91                0.70  +------------------+-----+ 77                0.59  +------------------+-----+ 86                0.66  +------------------+-----+  Right Doppler Findings: +--------+--------+ Site    Pressure +--------+--------+ Brachial130      +--------+--------+  Left Doppler Findings: +--------+--------+ Site    Pressure +--------+--------+ [id number]      +--------+--------+   Summary: Right Carotid: Velocities in the right ICA are  consistent with a 40-59%                stenosis. The ECA appears >50% stenosed. Left Carotid: Velocities in the left ICA are consistent with a 60-79% stenosis.               The ECA appears <50% stenosed. Vertebrals:  Bilateral vertebral arteries demonstrate antegrade flow. Subclavians: Right subclavian artery was stenotic. Normal flow hemodynamics were  seen in the left subclavian artery. Right ABI: Resting right ankle-brachial index indicates moderate right lower extremity arterial disease. The right toe-brachial index is normal. Left ABI: Resting left ankle-brachial index indicates moderate left lower extremity arterial disease. The left toe-brachial index is normal.  Electronically signed by Coral Else MD on 10/11/2023 at 9:28:11 PM.    Final    ECHOCARDIOGRAM COMPLETE  Result Date: 10/11/2023    ECHOCARDIOGRAM REPORT   Patient Name:   Leroy Proctor Date of Exam: 10/11/2023 Medical Rec #:  [id number]     Height:       68.0 in Accession #:    [id number]    Weight:       195.0 lb Date of Birth:  [birth year]     BSA:          2.022 m Patient Age:    72 years      BP:           147/72 mmHg Patient Gender: M             HR:           52 bpm. Exam Location:  Inpatient Procedure: 2D Echo, Color Doppler, Cardiac Doppler and Intracardiac            Opacification Agent Indications:    CAD Native Vessel i25.10  History:        Patient has no prior history of Echocardiogram examinations.                 CAD; Risk Factors:Hypertension and Dyslipidemia.  Sonographer:    Irving Burton Senior RDCS Referring Phys: Piedad Climes HARDING IMPRESSIONS  1. Left ventricular ejection fraction, by estimation, is 60 to 65%. The left ventricle has normal function. The left ventricle has no regional wall motion abnormalities. Left ventricular diastolic parameters were normal.  2. Right ventricular systolic function is normal. The right ventricular size is mildly enlarged. There is normal pulmonary artery systolic pressure. The estimated  right ventricular systolic pressure is 20.6 mmHg.  3. Left atrial size was severely dilated.  4. The mitral valve is normal in structure. Trivial mitral valve regurgitation. No evidence of mitral stenosis.  5. The aortic valve was not well visualized. Aortic valve regurgitation is not visualized. No aortic stenosis is present.  6. The inferior vena cava is normal in size with greater than 50% respiratory variability, suggesting right atrial pressure of 3 mmHg. FINDINGS  Left Ventricle: Left ventricular ejection fraction, by estimation, is 60 to 65%. The left ventricle has normal function. The left ventricle has no regional wall motion abnormalities. Definity contrast agent was given IV to delineate the left ventricular  endocardial borders. The left ventricular internal cavity size was normal in size. There is no left ventricular hypertrophy. Left ventricular diastolic parameters were normal. Right Ventricle: The right ventricular size is mildly enlarged. No increase in right ventricular wall thickness. Right ventricular systolic function is normal. There is normal pulmonary artery systolic pressure. The tricuspid regurgitant velocity is 2.10  m/s, and with an assumed right atrial pressure of 3 mmHg, the estimated right ventricular systolic pressure is 20.6 mmHg. Left Atrium: Left atrial size was severely dilated. Right Atrium: Right atrial size was normal in size. Pericardium: There is no evidence of pericardial effusion. Mitral Valve: The mitral valve is normal in structure. Trivial mitral valve regurgitation. No evidence of mitral valve stenosis. Tricuspid Valve: The tricuspid valve is normal in structure. Tricuspid valve regurgitation is trivial. Aortic Valve: The aortic  valve was not well visualized. Aortic valve regurgitation is not visualized. No aortic stenosis is present. Pulmonic Valve: The pulmonic valve was not well visualized. Pulmonic valve regurgitation is not visualized. Aorta: The aortic root and  ascending aorta are structurally normal, with no evidence of dilitation. Venous: The inferior vena cava is normal in size with greater than 50% respiratory variability, suggesting right atrial pressure of 3 mmHg. IAS/Shunts: The interatrial septum was not well visualized.  LEFT VENTRICLE PLAX 2D LVIDd:         4.90 cm   Diastology LVIDs:         3.20 cm   LV e' medial:    8.59 cm/s LV PW:         0.80 cm   LV E/e' medial:  8.9 LV IVS:        0.80 cm   LV e' lateral:   9.57 cm/s LVOT diam:     2.00 cm   LV E/e' lateral: 8.0 LV SV:         74 LV SV Index:   36 LVOT Area:     3.14 cm  RIGHT VENTRICLE RV S prime:     13.50 cm/s TAPSE (M-mode): 2.7 cm LEFT ATRIUM              Index        RIGHT ATRIUM           Index LA diam:        3.80 cm  1.88 cm/m   RA Area:     19.60 cm LA Vol (A2C):   132.0 ml 65.29 ml/m  RA Volume:   52.10 ml  25.77 ml/m LA Vol (A4C):   105.0 ml 51.93 ml/m LA Biplane Vol: 121.0 ml 59.85 ml/m  AORTIC VALVE LVOT Vmax:   96.60 cm/s LVOT Vmean:  76.600 cm/s LVOT VTI:    0.234 m  AORTA Ao Root diam: 3.10 cm Ao Asc diam:  3.20 cm MITRAL VALVE               TRICUSPID VALVE MV Area (PHT): 3.46 cm    TR Peak grad:   17.6 mmHg MV Decel Time: 219 msec    TR Vmax:        210.00 cm/s MV E velocity: 76.30 cm/s MV A velocity: 51.80 cm/s  SHUNTS MV E/A ratio:  1.47        Systemic VTI:  0.23 m                            Systemic Diam: 2.00 cm Epifanio Lesches MD Electronically signed by Epifanio Lesches MD Signature Date/Time: 10/11/2023/1:45:31 PM    Final    CARDIAC CATHETERIZATION  Result Date: 10/10/2023   Prox RCA lesion is 50% stenosed.   Mid RCA-1 lesion is 80% stenosed. Mid RCA-2 lesion is 50% stenosed.   Dist RCA lesion is 80% stenosed.   Prox LAD to Mid LAD lesion is 99% stenosed-followed by poststenotic dilatation, then Mid LAD-2 lesion is 70% stenosed.   Dist LAD lesion is 50% stenosed.   Mid Cx to Dist Cx lesion is 90% stenosed.   The left ventricular systolic function is normal.    LV end diastolic pressure is normal.   The left ventricular ejection fraction is 45-50% by visual estimate.   There is no aortic valve stenosis.   Anticipated discharge date to be determined.   Recommend Aspirin 81mg  daily  for moderate CAD. Severe multivessel CAD involving extensive disease lesions throughout the RCA, 2 focal lesions 1 numb 99% in the very early mid LAD as well as severe 90% stenosis of the of the LCx. Normal LVEDP with preserved LVEF. RECOMMENDATION  Patient will be admitted to stepdown/progressive care unit for ongoing management based on him having resting chest pain concerning for progressive angina. With three-vessel disease, we will start IV heparin 2 hours after sheath removal. Not on Thienopyridine. Check 2D echo and consult CVTS Restart most home medications. Bryan Lemma, MD  CT CORONARY FFR DATA PREP & FLUID ANALYSIS  Result Date: 09/21/2023 EXAM: CT-FFR ANALYSIS CLINICAL DATA:  Possibly obstructive coronary lesion(s): Ostial RCA, proximal RCA, mid RCA, proximal LAD, mid LCX FINDINGS: CT-FFR analysis was performed on the original cardiac CT angiogram dataset. Diagrammatic representation of the CT-FFR analysis is provided in a separate PDF document in PACS. This dictation was created using the PDF document and an interactive 3D model of the results. 3D model is not available in the EMR/PACS. Normal FFR range is >0.80. 1. Left Main: No significant functional stenosis. 2. LAD: Significant functional stenosis, CT-FFR 0.75 proximal LAD 3. LCX: Significant functional stenosis, CT-FFR 0.63 mid LCX 4. RCA: Significant functional stenosis, CT-FFR only 0.96 at ostial RCA, but borderline at 0.81 at the proximal RCA with delta FFR 0.03 and FFR 0.72 at mid RCA 5. IMPRESSION: 1. CT FFR analysis shows evidence of significant functional stenosis in RCA, LAD, and LCX territories Riley Lam MD Electronically Signed   By: Riley Lam M.D.   On: 09/21/2023 19:50     Treatments:  surgery:   10/12/2023 Patient:  Leroy Proctor Pre-Op Dx: 3V CAD HTN CKD HLP Bradycardia   Post-op Dx:  same Procedure: CABG X LIMA LAD, RSVG PDA, OM   Endoscopic greater saphenous vein harvest on the right     Surgeon and Role:      * Lightfoot, Eliezer Lofts, MD - Primary    Webb Laws , PA-C - assisting  Discharge Exam: Blood pressure (!) 146/75, pulse 92, temperature (!) 97.5 F (36.4 C), temperature source Oral, resp. rate 17, height 5\' 8"  (1.727 m), weight 86.5 kg, SpO2 95%.  General appearance: alert, cooperative, and no distress Heart: regular rate and rhythm Lungs: mildly dim in bases Abdomen: benign Extremities: no edema Wound: incis healing well  Discharge Medications:  The patient has been discharged on:   1.Beta Blocker:  Yes [  y ]                              No   [   ]                              If No, reason:  2.Ace Inhibitor/ARB: Yes [ y  ]                                     No  [    ]                                     If No, reason:  3.Statin:   Yes [  y ]  No  [   ]                  If No, reason:  4.Ecasa:  Yes  [  y ]                  No   [   ]                  If No, reason:  Patient had ACS upon admission:n  Plavix/P2Y12 inhibitor: Yes [   ]                                      No  [ n  ]     Discharge Instructions     Amb Referral to Cardiac Rehabilitation   Complete by: As directed    To [place]   Diagnosis: CABG   CABG X ___: 3   After initial evaluation and assessments completed: Virtual Based Care may be provided alone or in conjunction with Phase 2 Cardiac Rehab based on patient barriers.: Yes   Intensive Cardiac Rehabilitation (ICR) [facility] location only OR Traditional Cardiac Rehabilitation (TCR) *If criteria for ICR are not met will enroll in TCR [facility] only): Yes   Discharge patient   Complete by: As directed    Discharge disposition: 01-Home or Self Care   Discharge patient date: 10/18/2023       Allergies as of 10/18/2023       Reactions   Atenolol    bradycarda- heart rate down to 38 bpm   Demerol Nausea And Vomiting        Medication List     STOP taking these medications    meloxicam 15 MG tablet Commonly known as: MOBIC   tadalafil 20 MG tablet Commonly known as: CIALIS       TAKE these medications    amiodarone 400 MG tablet Commonly known as: PACERONE Take 1 tablet (400 mg total) by mouth 2 (two) times daily. For 5 days then 400 mg once daily   amLODipine 10 MG tablet Commonly known as: NORVASC Take 1 tablet (10 mg total) by mouth daily.   aspirin EC 325 MG tablet Take 1 tablet (325 mg total) by mouth daily. What changed:  medication strength how much to take additional instructions   atorvastatin 80 MG tablet Commonly known as: LIPITOR Take 1 tablet (80 mg total) by mouth daily. What changed:  medication strength how much to take   losartan 25 MG tablet Commonly known as: COZAAR Take 1 tablet (25 mg total) by mouth daily.   metoprolol tartrate 25 MG tablet Commonly known as: LOPRESSOR Take 1 tablet (25 mg total) by mouth 2 (two) times daily.   omeprazole 20 MG capsule Commonly known as: PRILOSEC TAKE 1 CAPSULE BY MOUTH EVERY MORNING 30 MINUTES BEFORE BREAKFAST   oxyCODONE 5 MG immediate release tablet Commonly known as: Oxy IR/ROXICODONE Take 1 tablet (5 mg total) by mouth every 6 (six) hours as needed for severe pain (pain score 7-10).               Durable Medical Equipment  (From admission, onward)           Start     Ordered   10/17/23 1017  For home use only DME 4 wheeled rolling walker with seat  Once       Question:  Patient needs a walker to treat with the following condition  Answer:  Physical deconditioning   10/17/23 1016            Follow-up Information     Lightfoot, Eliezer Lofts, MD Follow up.   Specialty: Cardiothoracic Surgery Why: Please see discharge paperwork for details of follow-up  appointment with Dr. Cliffton Asters.  Your first appointment will be a virtual appointment by telephone. Contact information: [address] [place] [address] [phone]         Marykay Lex, MD Follow up.   Specialty: Cardiology Why: Please see discharge paperwork for details of follow-up appointment with cardiology.  Your first appointment may be with a PA or nurse practitioner.  Please also obtain a MYCHART ACCOUNT to keep track of all of your future appointments with your providers. Contact information: [address] [place] [address] [phone]                 Signed:  Rowe Clack, PA-C  10/18/2023, 10:28 AM

## 2023-10-12 NOTE — Progress Notes (Signed)
Rapid wean protocol started at 1710.

## 2023-10-12 NOTE — Anesthesia Procedure Notes (Signed)
Arterial Line Insertion Start/End11/06/2023 7:11 AM, 10/12/2023 7:11 AM Performed by: CRNA  Patient location: Pre-op. Preanesthetic checklist: patient identified, IV checked, site marked, risks and benefits discussed, surgical consent, monitors and equipment checked, pre-op evaluation, timeout performed and anesthesia consent Lidocaine 1% used for infiltration Left, radial was placed Catheter size: 20 G Hand hygiene performed  and maximum sterile barriers used  Allen's test indicative of satisfactory collateral circulation Attempts: 1 Procedure performed without using ultrasound guided technique. Following insertion, Biopatch and dressing applied. Post procedure assessment: normal  Patient tolerated the procedure well with no immediate complications.

## 2023-10-12 NOTE — Consult Note (Addendum)
NAME:  Leroy Proctor, MRN:  [id number], DOB:  1951/10/07, LOS: 2 ADMISSION DATE:  10/10/2023, CONSULTATION DATE:  10/12/23 REFERRING MD:  Cliffton Asters, CHIEF COMPLAINT:  s/p CABG   History of Present Illness:  72 yo M PMH angina, LBBB, HTN, who presented 11/5 for elective L heart cath in the wake of an elevated calcium scote (90% percentile for age) . Was found to have severe 3v disease; CVTS was consulted for CABG eval.  On 11/7, underwent CABG x3. Admitted to ICU post op. PCCM consulted in this setting   Pre op had some angina and diaphoresis  Case unremarkable  Xclamp- 42 min Total pump- 76 min EBL - 915  Product -460 cellsaver   Pertinent  Medical History  HTN LBBB Angina mvCAD  Significant Hospital Events: Including procedures, antibiotic start and stop dates in addition to other pertinent events   11/5 elective LHC, severe 3v disease. CVTS consult 11/7 CABG x3   Interim History / Subjective:  POD0 CABG x3  Got NMB about a half hour prior to arrival on unit   Off of neo   Objective   Blood pressure (!) 173/77, pulse (!) 117, temperature 98 F (36.7 C), temperature source Oral, resp. rate 20, height 5\' 8"  (1.727 m), weight 88.5 kg, SpO2 96%.        Intake/Output Summary (Last 24 hours) at 10/12/2023 1228 Last data filed at 10/12/2023 1216 Gross per 24 hour  Intake 3322.15 ml  Output 1100 ml  Net 2222.15 ml   Filed Weights   10/10/23 1025 10/12/23 0637  Weight: 88.5 kg 88.5 kg    Examination: General: critically ill older adult M  HENT: R internal jugular CV oozy. ETT secured with tape  Lungs: mechanically ventilated, clear  Cardiovascular: v paced, s1s2, cap refill < 3 sec . Chest tube w 60ml output  Abdomen: soft, ndnt  Extremities: harvest site is wrapped  Neuro: chemically paralyzed  GU: foley with clear yellow urine   Resolved Hospital Problem list     Assessment & Plan:   3vCAD s/p CABG x3  Endotracheally intubated Expected ABLA  HTN  HLD Hx  LBBB GERD Hx dysphagia P -lines/drains/wires per CVTS -CXR, ABG -- will make vent changes +/- bicarb depending on this  -hopefully rapid vent wean -post op labs  -complete txa, ppx abx -insulin gtt  -multimodal analgesia  -4g mag -hemodynamics still settling out -- cardene is available if he becomes hypertensive, as are pressors if hypo but in d/w cvts albumin favored  -ASA statin   Best Practice (right click and "Reselect all SmartList Selections" daily)   Diet/type: NPO DVT prophylaxis: not indicated completing txa  GI prophylaxis: PPI Lines: Central line, Arterial Line, and yes and it is still needed Foley:  Yes, and it is still needed Code Status:  full code Last date of multidisciplinary goals of care discussion [--]  Labs   CBC: Recent Labs  Lab 10/05/23 1329 10/11/23 0449 10/11/23 0449 10/12/23 0217 10/12/23 0831 10/12/23 1046 10/12/23 1048 10/12/23 1114 10/12/23 1145 10/12/23 1148  WBC 6.3 6.1  --  6.2  --   --   --   --   --   --   HGB 14.1 12.7*   < > 13.0   < > 10.2* 10.0* 10.2* 10.5* 10.2*  HCT 42.0 38.6*   < > 38.5*   < > 30.0* 29.7* 30.0* 31.0* 30.0*  MCV 90 86.5  --  85.7  --   --   --   --   --   --  PLT 257 227  --  239  --   --  210  --   --   --    < > = values in this interval not displayed.    Basic Metabolic Panel: Recent Labs  Lab 10/05/23 1329 10/12/23 0217 10/12/23 0831 10/12/23 0834 10/12/23 0949 10/12/23 1021 10/12/23 1030 10/12/23 1046 10/12/23 1114 10/12/23 1145 10/12/23 1148  NA 140 135   < > 140 139   < > 138 139 138 139 139  K 4.7 3.6   < > 3.6 3.6   < > 4.5 4.2 4.5 4.0 4.0  CL 101 104  --  104 103  --   --  103  --   --  104  CO2 26 22  --   --   --   --   --   --   --   --   --   GLUCOSE 87 96  --  135* 139*  --   --  125*  --   --  161*  BUN 20 20  --  18 18  --   --  18  --   --  17  CREATININE 0.90 1.01  --  0.80 0.80  --   --  0.70  --   --  0.70  CALCIUM 9.0 8.5*  --   --   --   --   --   --   --   --   --     < > = values in this interval not displayed.   GFR: Estimated Creatinine Clearance: 90.2 mL/min (by C-G formula based on SCr of 0.7 mg/dL). Recent Labs  Lab 10/05/23 1329 10/11/23 0449 10/12/23 0217  WBC 6.3 6.1 6.2    Liver Function Tests: No results for input(s): "AST", "ALT", "ALKPHOS", "BILITOT", "PROT", "ALBUMIN" in the last 168 hours. No results for input(s): "LIPASE", "AMYLASE" in the last 168 hours. No results for input(s): "AMMONIA" in the last 168 hours.  ABG    Component Value Date/Time   PHART 7.358 10/12/2023 1145   PCO2ART 37.5 10/12/2023 1145   PO2ART 385 (H) 10/12/2023 1145   HCO3 21.1 10/12/2023 1145   TCO2 24 10/12/2023 1148   ACIDBASEDEF 4.0 (H) 10/12/2023 1145   O2SAT 100 10/12/2023 1145     Coagulation Profile: Recent Labs  Lab 10/11/23 1825  INR 1.1    Cardiac Enzymes: No results for input(s): "CKTOTAL", "CKMB", "CKMBINDEX", "TROPONINI" in the last 168 hours.  HbA1C: Hgb A1c MFr Bld  Date/Time Value Ref Range Status  10/11/2023 06:25 PM 5.5 4.8 - 5.6 % Final    Comment:    (NOTE) Pre diabetes:          5.7%-6.4%  Diabetes:              >6.4%  Glycemic control for   <7.0% adults with diabetes   04/19/2022 08:30 AM 5.7 4.6 - 6.5 % Final    Comment:    Glycemic Control Guidelines for People with Diabetes:Non Diabetic:  <6%Goal of Therapy: <7%Additional Action Suggested:  >8%     CBG: No results for input(s): "GLUCAP" in the last 168 hours.  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   Surgical History:   Past Surgical History:  Procedure Laterality Date   COLONOSCOPY  2002; 2014   Repeat 2024   LEFT HEART CATH AND CORONARY ANGIOGRAPHY N/A 10/10/2023   Procedure: LEFT HEART CATH AND CORONARY ANGIOGRAPHY;  Surgeon: Marykay Lex, MD;  Location: [facility] INVASIVE CV LAB;  Service: Cardiovascular;  Laterality: N/A;   LITHOTRIPSY       Social History:   reports that he has quit smoking. His smoking use included cigarettes. He has a 7.5 pack-year smoking history. He has never used smokeless tobacco. He reports current alcohol use of about 13.0 standard drinks of alcohol per week. He reports that he does not use drugs.   Family History:  His family history includes Diabetes in his father; Hearing loss in his mother. There is no history of Colon cancer, Stomach cancer, or Esophageal cancer.   Allergies Allergies  Allergen Reactions   Atenolol     bradycarda- heart rate down to 38 bpm   Demerol Nausea And Vomiting     Home Medications  Prior to Admission medications   Medication Sig Start Date End Date Taking? Authorizing Provider  amLODipine (NORVASC) 10 MG tablet Take 1 tablet (10 mg total) by mouth daily. 06/19/23  Yes Saguier, Kateri Mc  aspirin EC 81 MG tablet Take 1 tablet (81 mg total) by mouth daily. Swallow whole. 09/19/23  Yes Madireddy, Marlyn Corporal, MD  atorvastatin (LIPITOR) 40 MG tablet Take 1 tablet (40 mg total) by mouth daily. 10/05/23 01/03/24 Yes Madireddy, Marlyn Corporal, MD  meloxicam (MOBIC) 15 MG tablet Take 15 mg by mouth daily. 08/10/23  Yes [provider]  metoprolol tartrate (LOPRESSOR) 25 MG tablet Take 1 tablet (25 mg total) by mouth 2 (two) times daily. 09/19/23  Yes Madireddy, Marlyn Corporal, MD  omeprazole  (PRILOSEC) 20 MG capsule TAKE 1 CAPSULE BY MOUTH EVERY MORNING 30 MINUTES BEFORE BREAKFAST 06/19/23  Yes Saguier, Ramon Dredge, PA-C  tadalafil (CIALIS) 20 MG tablet TAKE 1/2 TO 1 TABLET BY MOUTH EVERY OTHER DAY AS NEEDED FOR ERECTILE DYSFUNCTION 08/22/23  Yes Saguier, Ramon Dredge, PA-C     Critical care time: 35 min    CRITICAL CARE Performed by: Lanier Clam   Total critical care time: 35 minutes  Critical care time was exclusive of separately billable procedures and treating other patients. Critical care was necessary to treat or prevent imminent or life-threatening deterioration.  Critical care was time spent personally by me on the following activities: development of treatment plan with patient and/or surrogate as well as nursing, discussions with consultants, evaluation of patient's response to treatment, examination of patient, obtaining history from patient or surrogate, ordering and performing treatments and interventions, ordering and review of laboratory studies, ordering and review of radiographic studies, pulse oximetry and re-evaluation of patient's condition.  Tessie Fass MSN, AGACNP-BC [place] [facility] Amion for pager  10/12/2023, 1:20 PM

## 2023-10-12 NOTE — Transfer of Care (Signed)
Immediate Anesthesia Transfer of Care Note  Patient: Leroy Proctor  Procedure(s) Performed: CORONARY ARTERY BYPASS GRAFTING (CABG) X THREE, USING LEFT INTERNAL MAMMARY ARTERY AND RIGHT GREATER SAPHENEOUS VEIN HARVESTED ENDOSCOPICLY (Chest) TRANSESOPHAGEAL ECHOCARDIOGRAM  Patient Location: ICU  Anesthesia Type:General  Level of Consciousness: sedated and unresponsive  Airway & Oxygen Therapy: Patient remains intubated per anesthesia plan and Patient placed on Ventilator (see vital sign flow sheet for setting)  Post-op Assessment: Report given to RN and Post -op Vital signs reviewed and stable  Post vital signs: Reviewed and stable  Last Vitals:  Vitals Value Taken Time  BP    Temp    Pulse 80 10/12/23 1301  Resp 16 10/12/23 1301  SpO2 100 % 10/12/23 1301  Vitals shown include unfiled device data.  Last Pain:  Vitals:   10/12/23 0637  TempSrc: Oral  PainSc:          Complications: No notable events documented.

## 2023-10-12 NOTE — Progress Notes (Signed)
     [address]       [place] [address]             [phone]       Some nausea this morningt  Vitals:   10/12/23 0410 10/12/23 0637  BP: (!) 146/71 (!) 173/77  Pulse: (!) 50 (!) 117  Resp: 16 20  Temp: 98 F (36.7 C) 98 F (36.7 C)  SpO2: 96% 96%   Alert, diaphoretic Sinus brady EWOB  OR today for cabg  Corliss Skains

## 2023-10-12 NOTE — Progress Notes (Signed)
Patient ID: Leroy Proctor, male   DOB: [birth year], 72 y.o.   MRN: [id number]  TCTS Evening Rounds:   Hemodynamically stable  CI = 2.9 Rhythm is sinus with some intermittent heart block.  Extubated  Urine output good  CT output low  CBC    Component Value Date/Time   WBC 8.7 10/12/2023 1300   RBC 3.71 (L) 10/12/2023 1300   HGB 10.6 (L) 10/12/2023 1300   HGB 14.1 10/05/2023 1329   HCT 32.1 (L) 10/12/2023 1300   HCT 42.0 10/05/2023 1329   PLT 165 10/12/2023 1300   PLT 257 10/05/2023 1329   MCV 86.5 10/12/2023 1300   MCV 90 10/05/2023 1329   MCH 28.6 10/12/2023 1300   MCHC 33.0 10/12/2023 1300   RDW 12.5 10/12/2023 1300   RDW 12.8 10/05/2023 1329   LYMPHSABS 943 09/04/2023 1635   MONOABS 0.7 10/14/2021 0829   EOSABS 360 09/04/2023 1635   BASOSABS 42 09/04/2023 1635     BMET    Component Value Date/Time   NA 139 10/12/2023 1148   NA 140 10/05/2023 1329   K 4.0 10/12/2023 1148   CL 104 10/12/2023 1148   CO2 22 10/12/2023 0217   GLUCOSE 161 (H) 10/12/2023 1148   BUN 17 10/12/2023 1148   BUN 20 10/05/2023 1329   CREATININE 0.70 10/12/2023 1148   CREATININE 0.76 09/04/2023 1635   CALCIUM 8.5 (L) 10/12/2023 0217   EGFR 91 10/05/2023 1329   GFRNONAA >60 10/12/2023 0217   GFRNONAA 79 03/14/2014 0842     A/P:  Stable postop course. Continue current plans

## 2023-10-12 NOTE — Op Note (Signed)
[address]       [place] [id number]             [phone]                                          10/12/2023 Patient:  Leroy Proctor Pre-Op Dx: 3V CAD HTN CKD HLP Bradycardia   Post-op Dx:  same Procedure: CABG X LIMA LAD, RSVG PDA, OM   Endoscopic greater saphenous vein harvest on the right   Surgeon and Role:      * Jaimes Eckert, Eliezer Lofts, MD - Primary    Webb Laws , PA-C - assisting An experienced assistant was required given the complexity of this surgery and the standard of surgical care. The assistant was needed for exposure, dissection, suctioning, retraction of delicate tissues and sutures, instrument exchange and for overall help during this procedure.    Anesthesia  general EBL:  Blood Administration: none Xclamp Time:  42 min Pump Time:   Drains: 15 F blake drain: L, mediastinal  Wires: ventricular Counts: correct   Indications: 72yo male admitted with 3V CAD, and hx of anginal symptoms.  Echocardiogram shows preserved biventricular function and no significant valvular disease. He was agreeable to proceed with CABG Findings: Good LIMA and vein.  Good LAD, small PDA, and good OM  Operative Technique: All invasive lines were placed in pre-op holding.  After the risks, benefits and alternatives were thoroughly discussed, the patient was brought to the operative theatre.  Anesthesia was induced, and the patient was prepped and draped in normal sterile fashion.  An appropriate surgical pause was performed, and pre-operative antibiotics were dosed accordingly.  We began with simultaneous incisions along the right leg for harvesting of the greater saphenous vein and the chest for the sternotomy.  In regards to the sternotomy, this was carried down with bovie cautery, and the sternum was divided with a reciprocating saw.  Meticulous hemostasis was obtained.  The left internal thoracic artery was exposed and harvested in in  pedicled fashion.  The patient was systemically heparinized, and the artery was divided distally, and placed in a papaverine sponge.    The sternal elevator was removed, and a retractor was placed.  The pericardium was divided in the midline and fashioned into a cradle with pericardial stitches.   After we confirmed an appropriate ACT, the ascending aorta was cannulated in standard fashion.  The right atrial appendage was used for venous cannulation site.  Cardiopulmonary bypass was initiated, and the heart retractor was placed. The cross clamp was applied, and a dose of anterograde cardioplegia was given with good arrest of the heart.  We moved to the posterior wall of the heart, and found a good target on the PDA.  An arteriotomy was made, and the vein graft was anastomosed to it in an end to side fashion.  Next we exposed the lateral wall, and found a good target on the OM.  An end to side anastomosis with the vein graft was then created.   Finally, we exposed a good target on the  LAD, and fashioned an end to side anastomosis between it and the LITA.  We began to re-warm, and a re-animation dose of cardioplegia was given.  The heart was de-aired, and the cross clamp was removed.  Meticulous hemostasis was obtained.  A partial occludding clamp was then placed on the ascending aorta, and we created an end to side anastomosis between it and the proximal vein grafts.  Rings were placed on the proximal anastomosis.  Hemostasis was obtained, and we separated from cardiopulmonary bypass without event.  The heparin was reversed with protamine.  Chest tubes and wires were placed, and the sternum was re-approximated with sternal wires.  The soft tissue and skin were re-approximated wth absorbable suture.    The patient tolerated the procedure without any immediate complications, and was transferred to the ICU in guarded condition.  Leroy Proctor

## 2023-10-12 NOTE — Hospital Course (Addendum)
Referring: Marykay Lex, MD Primary Care: Marisue Brooklyn Primary Cardiologist:None    History of Present Illness: At time of CT surgical consultation  We are asked to see this 72 year old male in CT surgical consultation for consideration of coronary artery surgical revascularization.  The patient has multiple cardiac risk factors including hypertension,  hyperlipidemia and history of tobacco abuse.  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  The patient had an abnormal CT coronary angiogram in October of this year following a visit to the emergency room where he presented with chest discomfort.  He reports that he does workout on a regular basis at the [facility] without significant symptoms normally, however does have heaviness in his chest at times even at rest.  He has noticed recently during workouts that his blood pressure has been much lower than normal for him.  The CTA showed hemodynamically significant findings by FFR.  He had a significantly elevated calcium score of 1624 which was 90th centile for age.  It was felt to be consistent with four-vessel significant stenosis.  Due to these findings he was felt to require cardiac catheterization which was done today and he was found to have confirmation of the three-vessel severe disease.  Echocardiogram revealed no significant valvular disease.  EF was 60 to 65%.  Hospital course:  Following diagnostic evaluation and medical stabilization the patient was felt to be stable to proceed for surgery on 10/12/2023.  Was taken the operating room at which time he underwent CABG x 3 by Dr. Cliffton Asters.  A LIMA-LAD, SVG-OM, and SVG-PD grafts were placed.  Patient tolerated the procedure well was taken to the surgical intensive care unit in stable condition.  He was extubated the evening of surgery.  He required support with neo-synephrine which was weaned off on the first  postoperative day.  Diuresis was begun while in the ICU.  He was transferred to 4E [facility] on postop day 2.  He developed atrial fibrillation in the evening on post-op day 2. Ventricular rate for the atrial fibrillation was in the 80-110 range and is improved over time.  He was started on IV amiodarone loading initially was transition to oral.  He is now maintaining sinus with occasional PVCs and a known left bundle branch block.  His blood pressure has been somewhat difficult to control but it has improved over time with initiation of losartan and resumption of Norvasc.  He is tolerating gradually increasing activity using standard cardiac rehab modalities.  Incisions are healing well without evidence of infection.  He is tolerating diet.  Oxygen was weaned and he maintains good saturations on room air.  He does have an expected acute blood loss anemia which is stable.  He did have some expected volume excess related to surgery and not congestive heart failure which responded well to diuretics.  These have subsequently been discontinued as he is euvolemic.  Overall, at the time of discharge the patient is felt to be quite stable.

## 2023-10-12 NOTE — Anesthesia Procedure Notes (Signed)
Central Venous Catheter Insertion Performed by: Achille Rich, MD, anesthesiologist Start/End11/06/2023 7:48 AM, 10/12/2023 7:58 AM Patient location: Pre-op. Preanesthetic checklist: patient identified, IV checked, site marked, risks and benefits discussed, surgical consent, monitors and equipment checked, pre-op evaluation, timeout performed and anesthesia consent Position: Trendelenburg Patient sedated Hand hygiene performed  and maximum sterile barriers used  Catheter size: 8.5 Fr Sheath introducer Procedure performed using ultrasound guided technique. Ultrasound Notes:anatomy identified, needle tip was noted to be adjacent to the nerve/plexus identified, no ultrasound evidence of intravascular and/or intraneural injection and image(s) printed for medical record Attempts: 1 Following insertion, line sutured and dressing applied. Post procedure assessment: blood return through all ports, free fluid flow and no air  Patient tolerated the procedure well with no immediate complications.

## 2023-10-12 NOTE — Procedures (Signed)
Extubation Procedure Note  Patient Details:   Name: Leroy Proctor DOB: 1951/04/15 MRN: [id number]   Airway Documentation:    Vent end date: 10/12/23 Vent end time: 1805   Evaluation  O2 sats: stable throughout Complications: No apparent complications Patient did tolerate procedure well. Bilateral Breath Sounds: Clear   Yes  Patient extubated to 2L [place].  NIF -26, VC of 864 performed with good effort.  Positive cuff leak noted.  No evidence of stridor.  Patient able to speak post extubation.  Sats and vitals are currently stable at this time.    Elyn Peers 10/12/2023, 6:10 PM

## 2023-10-12 NOTE — Anesthesia Preprocedure Evaluation (Signed)
Anesthesia Evaluation  Patient identified by MRN, date of birth, ID band Patient awake    Reviewed: Allergy & Precautions, H&P , NPO status , Patient's Chart, lab work & pertinent test results  Airway Mallampati: II   Neck ROM: full    Dental   Pulmonary former smoker   breath sounds clear to auscultation       Cardiovascular hypertension, + CAD   Rhythm:regular Rate:Normal     Neuro/Psych    GI/Hepatic ,GERD  ,,  Endo/Other    Renal/GU Renal InsufficiencyRenal disease     Musculoskeletal  (+) Arthritis ,    Abdominal   Peds  Hematology   Anesthesia Other Findings   Reproductive/Obstetrics                             Anesthesia Physical Anesthesia Plan  ASA: 3  Anesthesia Plan: General   Post-op Pain Management:    Induction: Intravenous  PONV Risk Score and Plan: 2 and Ondansetron, Dexamethasone, Midazolam and Treatment may vary due to age or medical condition  Airway Management Planned: Oral ETT  Additional Equipment: Arterial line  Intra-op Plan:   Post-operative Plan: Post-operative intubation/ventilation  Informed Consent: I have reviewed the patients History and Physical, chart, labs and discussed the procedure including the risks, benefits and alternatives for the proposed anesthesia with the patient or authorized representative who has indicated his/her understanding and acceptance.     Dental advisory given  Plan Discussed with: CRNA, Anesthesiologist and Surgeon  Anesthesia Plan Comments:        Anesthesia Quick Evaluation

## 2023-10-13 ENCOUNTER — Inpatient Hospital Stay (HOSPITAL_COMMUNITY): Payer: Medicare PPO

## 2023-10-13 ENCOUNTER — Encounter (HOSPITAL_COMMUNITY): Payer: Self-pay | Admitting: Thoracic Surgery (Cardiothoracic Vascular Surgery)

## 2023-10-13 DIAGNOSIS — I200 Unstable angina: Secondary | ICD-10-CM | POA: Diagnosis not present

## 2023-10-13 LAB — CBC
HCT: 32.1 % — ABNORMAL LOW (ref 39.0–52.0)
HCT: 35.6 % — ABNORMAL LOW (ref 39.0–52.0)
Hemoglobin: 11 g/dL — ABNORMAL LOW (ref 13.0–17.0)
Hemoglobin: 11.5 g/dL — ABNORMAL LOW (ref 13.0–17.0)
MCH: 29.9 pg (ref 26.0–34.0)
MCH: 29 pg (ref 26.0–34.0)
MCHC: 34.3 g/dL (ref 30.0–36.0)
MCHC: 32.3 g/dL (ref 30.0–36.0)
MCV: 87.2 fL (ref 80.0–100.0)
MCV: 89.7 fL (ref 80.0–100.0)
Platelets: 186 10*3/uL (ref 150–400)
Platelets: 237 10*3/uL (ref 150–400)
RBC: 3.68 MIL/uL — ABNORMAL LOW (ref 4.22–5.81)
RBC: 3.97 MIL/uL — ABNORMAL LOW (ref 4.22–5.81)
RDW: 12.9 % (ref 11.5–15.5)
RDW: 13.2 % (ref 11.5–15.5)
WBC: 10.3 10*3/uL (ref 4.0–10.5)
WBC: 16 10*3/uL — ABNORMAL HIGH (ref 4.0–10.5)
nRBC: 0 % (ref 0.0–0.2)
nRBC: 0 % (ref 0.0–0.2)

## 2023-10-13 LAB — TYPE AND SCREEN
ABO/RH(D): O NEG
Antibody Screen: NEGATIVE
Unit division: 0
Unit division: 0

## 2023-10-13 LAB — GLUCOSE, CAPILLARY
Glucose-Capillary: 109 mg/dL — ABNORMAL HIGH (ref 70–99)
Glucose-Capillary: 134 mg/dL — ABNORMAL HIGH (ref 70–99)
Glucose-Capillary: 134 mg/dL — ABNORMAL HIGH (ref 70–99)
Glucose-Capillary: 134 mg/dL — ABNORMAL HIGH (ref 70–99)
Glucose-Capillary: 135 mg/dL — ABNORMAL HIGH (ref 70–99)
Glucose-Capillary: 156 mg/dL — ABNORMAL HIGH (ref 70–99)
Glucose-Capillary: 161 mg/dL — ABNORMAL HIGH (ref 70–99)
Glucose-Capillary: 169 mg/dL — ABNORMAL HIGH (ref 70–99)
Glucose-Capillary: 181 mg/dL — ABNORMAL HIGH (ref 70–99)

## 2023-10-13 LAB — BASIC METABOLIC PANEL
Anion gap: 5 (ref 5–15)
Anion gap: 7 (ref 5–15)
BUN: 12 mg/dL (ref 8–23)
BUN: 16 mg/dL (ref 8–23)
CO2: 23 mmol/L (ref 22–32)
CO2: 25 mmol/L (ref 22–32)
Calcium: 7.5 mg/dL — ABNORMAL LOW (ref 8.9–10.3)
Calcium: 8.1 mg/dL — ABNORMAL LOW (ref 8.9–10.3)
Chloride: 108 mmol/L (ref 98–111)
Chloride: 105 mmol/L (ref 98–111)
Creatinine, Ser: 0.82 mg/dL (ref 0.61–1.24)
Creatinine, Ser: 1.16 mg/dL (ref 0.61–1.24)
GFR, Estimated: >60 mL/min (ref >60)
GFR, Estimated: >60 mL/min (ref >60)
Glucose, Bld: 131 mg/dL — ABNORMAL HIGH (ref 70–99)
Glucose, Bld: 160 mg/dL — ABNORMAL HIGH (ref 70–99)
Potassium: 3.6 mmol/L (ref 3.5–5.1)
Potassium: 4.4 mmol/L (ref 3.5–5.1)
Sodium: 136 mmol/L (ref 135–145)
Sodium: 137 mmol/L (ref 135–145)

## 2023-10-13 LAB — BPAM RBC
Blood Product Expiration Date: 202411132359
Blood Product Expiration Date: 202411142359
Unit Type and Rh: 9500
Unit Type and Rh: 9500

## 2023-10-13 LAB — ECHO INTRAOPERATIVE TEE
Height: 68 in
Weight: 3119.99 [oz_av]

## 2023-10-13 LAB — MAGNESIUM
Magnesium: 2.1 mg/dL (ref 1.7–2.4)
Magnesium: 2.2 mg/dL (ref 1.7–2.4)

## 2023-10-13 MED ORDER — INSULIN ASPART 100 UNIT/ML IJ SOLN
0.0000 [IU] | INTRAMUSCULAR | Status: DC
  Administered 2023-10-13: 4 [IU] via SUBCUTANEOUS

## 2023-10-13 MED ORDER — INSULIN ASPART 100 UNIT/ML IJ SOLN
0.0000 [IU] | INTRAMUSCULAR | Status: DC
  Administered 2023-10-13: 4 [IU] via SUBCUTANEOUS
  Administered 2023-10-13 – 2023-10-17 (×7): 2 [IU] via SUBCUTANEOUS

## 2023-10-13 MED ORDER — ENOXAPARIN SODIUM 40 MG/0.4ML IJ SOSY
40.0000 mg | PREFILLED_SYRINGE | Freq: Every day | INTRAMUSCULAR | Status: DC
  Administered 2023-10-13 – 2023-10-17 (×5): 40 mg via SUBCUTANEOUS
  Filled 2023-10-13 (×5): qty 0.4

## 2023-10-13 MED ORDER — FUROSEMIDE 10 MG/ML IJ SOLN
40.0000 mg | Freq: Once | INTRAMUSCULAR | Status: AC
  Administered 2023-10-13: 40 mg via INTRAVENOUS
  Filled 2023-10-13: qty 4

## 2023-10-13 MED ORDER — POTASSIUM CHLORIDE 10 MEQ/50ML IV SOLN
10.0000 meq | INTRAVENOUS | Status: AC
  Administered 2023-10-13 (×3): 10 meq via INTRAVENOUS
  Filled 2023-10-13 (×3): qty 50

## 2023-10-13 MED ORDER — INSULIN ASPART 100 UNIT/ML IJ SOLN: 0.0000 [IU] | INTRAMUSCULAR | Status: DC

## 2023-10-13 NOTE — Plan of Care (Signed)
Continues to feel nauseated.Will give meds as ordered

## 2023-10-13 NOTE — Anesthesia Postprocedure Evaluation (Signed)
Anesthesia Post Note  Patient: Leroy Proctor  Procedure(s) Performed: CORONARY ARTERY BYPASS GRAFTING (CABG) X THREE, USING LEFT INTERNAL MAMMARY ARTERY AND RIGHT GREATER SAPHENEOUS VEIN HARVESTED ENDOSCOPICLY (Chest) TRANSESOPHAGEAL ECHOCARDIOGRAM     Patient location during evaluation: SICU Anesthesia Type: General Level of consciousness: sedated Pain management: pain level controlled Vital Signs Assessment: post-procedure vital signs reviewed and stable Respiratory status: patient remains intubated per anesthesia plan Cardiovascular status: stable Postop Assessment: no apparent nausea or vomiting Anesthetic complications: no   No notable events documented.  Last Vitals:  Vitals:   10/13/23 1045 10/13/23 1118  BP:    Pulse: 61   Resp: 19   Temp:  36.5 C  SpO2: 96%     Last Pain:  Vitals:   10/13/23 1118  TempSrc: Oral  PainSc:                  Arraya Buck S

## 2023-10-13 NOTE — Progress Notes (Addendum)
[facility] DAILY ICU PROGRESS NOTE                   [address]            [place] [address]          [phone]   1 Day Post-Op Procedure(s) (LRB): CORONARY ARTERY BYPASS GRAFTING (CABG) X THREE, USING LEFT INTERNAL MAMMARY ARTERY AND RIGHT GREATER SAPHENEOUS VEIN HARVESTED ENDOSCOPICLY (N/A) TRANSESOPHAGEAL ECHOCARDIOGRAM (N/A)  Total Length of Stay:  LOS: 3 days   Subjective: Extubated around 6pm yesterday. Awake and alert, pain control adequate.  Complains of nausea but no vomiting.   On low-dose Neo.    Objective: Vital signs in last 24 hours: Temp:  [96.8 F (36 C)-99.5 F (37.5 C)] 97.7 F (36.5 C) (11/08 0801) Pulse Rate:  [8-106] 70 (11/08 0815) Cardiac Rhythm: Normal sinus rhythm;Ventricular paced (11/07 2111) Resp:  [8-28] 10 (11/08 0815) BP: (109-124)/(64-70) 109/70 (11/08 0745) SpO2:  [94 %-100 %] 97 % (11/08 0815) Arterial Line BP: (82-160)/(40-81) 115/53 (11/08 0815) FiO2 (%):  [40 %-50 %] 40 % (11/07 1735) Weight:  [91.9 kg] 91.9 kg (11/08 0500)  Filed Weights   10/10/23 1025 10/12/23 0637 10/13/23 0500  Weight: 88.5 kg 88.5 kg 91.9 kg    Weight change: 3.449 kg   Hemodynamic parameters for last 24 hours: CVP:  [2 mmHg] 2 mmHg CO:  [2.6 L/min-7.8 L/min] 5.3 L/min CI:  [1.3 L/min/m2-3.9 L/min/m2] 2.6 L/min/m2  Intake/Output from previous day: 11/07 0701 - 11/08 0700 In: 5931 [I.V.:2453.2; Blood:460; IV Piggyback:3017.8] Out: 3015 [Urine:1830; Blood:915; Chest Tube:270]  Intake/Output this shift: Total I/O In: 59.2 [I.V.:28.5; IV Piggyback:30.7] Out: -   Current Meds: Scheduled Meds:  acetaminophen  1,000 mg Oral Q6H   Or   acetaminophen (TYLENOL) oral liquid 160 mg/5 mL  1,000 mg Per Tube Q6H   aspirin EC  325 mg Oral Daily   Or   aspirin  324 mg Per Tube Daily   atorvastatin  80 mg Oral Daily   bisacodyl  10 mg Oral Daily   Or   bisacodyl  10 mg Rectal Daily   Chlorhexidine Gluconate Cloth  6 each Topical Daily    docusate  200 mg Per Tube Daily   Or   docusate sodium  200 mg Oral Daily   metoCLOPramide (REGLAN) injection  10 mg Intravenous Q6H   metoprolol tartrate  12.5 mg Oral BID   Or   metoprolol tartrate  12.5 mg Per Tube BID   [START ON 10/14/2023] pantoprazole  40 mg Oral Daily   pantoprazole (PROTONIX) IV  40 mg Intravenous QHS   sodium chloride flush  3 mL Intravenous Q12H   Continuous Infusions:  sodium chloride 10 mL/hr at 10/13/23 0800   albumin human Stopped (10/12/23 1710)    ceFAZolin (ANCEF) IV Stopped (10/13/23 9147)   insulin 1 Units/hr (10/13/23 0800)   lactated ringers 20 mL/hr at 10/13/23 0800   nitroGLYCERIN Stopped (10/13/23 0625)   phenylephrine (NEO-SYNEPHRINE) Adult infusion 5 mcg/min (10/13/23 0800)   potassium chloride Stopped (10/13/23 0736)   PRN Meds:.sodium chloride, albumin human, dextrose, metoprolol tartrate, midazolam, morphine injection, ondansetron (ZOFRAN) IV, mouth rinse, oxyCODONE, sodium chloride flush, traMADol  General appearance: alert, cooperative, and mild distress Neurologic: intact Heart: SR in the 70's with occasional pacing (on VVI at 60) Lungs: breath sounds clear, shallow. CXR OK. CTR drainage thin, past 12 hours.  Abdomen: soft, no tenderness, absent bowel sounds Extremities: warm, no  peripheral edema Wound: the sternotomy is covered with a dry Aquacel dressing. The RLE EVH incision is intact and dry.   Lab Results: CBC: Recent Labs    10/12/23 1916 10/12/23 1918 10/13/23 0437  WBC 6.6  --  10.3  HGB 10.0* 10.2* 11.0*  HCT 30.6* 30.0* 32.1*  PLT 141*  --  186   BMET:  Recent Labs    10/12/23 1916 10/12/23 1918 10/13/23 0437  NA 137 139 136  K 3.9 4.0 3.6  CL 105  --  108  CO2 20*  --  23  GLUCOSE 114*  --  131*  BUN 12  --  12  CREATININE 0.88  --  0.82  CALCIUM 7.6*  --  7.5*    CMET: Lab Results  Component Value Date   WBC 10.3 10/13/2023   HGB 11.0 (L) 10/13/2023   HCT 32.1 (L) 10/13/2023   PLT 186  10/13/2023   GLUCOSE 131 (H) 10/13/2023   CHOL 149 10/11/2023   TRIG 38 10/11/2023   HDL 48 10/11/2023   LDLCALC 93 10/11/2023   ALT 14 09/04/2023   AST 15 09/04/2023   NA 136 10/13/2023   K 3.6 10/13/2023   CL 108 10/13/2023   CREATININE 0.82 10/13/2023   BUN 12 10/13/2023   CO2 23 10/13/2023   TSH 0.76 11/03/2016   PSA 0.24 04/19/2022   INR 1.5 (H) 10/12/2023   HGBA1C 5.5 10/11/2023      PT/INR:  Recent Labs    10/12/23 1300  LABPROT 18.5*  INR 1.5*   Radiology: DG Chest Port 1 View  Result Date: 10/12/2023 CLINICAL DATA:  Status post CABG EXAM: PORTABLE CHEST 1 VIEW COMPARISON:  Chest radiograph 1 day prior FINDINGS: There has been interval median sternotomy and CABG. The endotracheal tube tip is proximally 4.6 cm from the carina. The right IJ vascular catheter is in the mid SVC. The enteric catheter tip is off the field of view. A presumed mediastinal drain and left chest tube are noted. The heart is at the upper limits of normal for size. The upper mediastinal contours are stable. There is vascular congestion and possible mild pulmonary interstitial edema. There is probable left basilar subsegmental atelectasis. There is no significant effusion. There is no pneumothorax. There is no acute osseous abnormality. IMPRESSION: 1. Interval median sternotomy and CABG with support apparatus as above. 2. Vascular congestion and possible mild pulmonary interstitial edema, and probable left basilar subsegmental atelectasis. Electronically Signed   By: Lesia Hausen M.D.   On: 10/12/2023 15:21   EP STUDY  Result Date: 10/12/2023 See surgical note for result.    Assessment/Plan: S/P Procedure(s) (LRB): CORONARY ARTERY BYPASS GRAFTING (CABG) X THREE, USING LEFT INTERNAL MAMMARY ARTERY AND RIGHT GREATER SAPHENEOUS VEIN HARVESTED ENDOSCOPICLY (N/A) TRANSESOPHAGEAL ECHOCARDIOGRAM (N/A)  -POD1 CABG x 3, normal biventricular function.  Stable VS and cardiac rhythm. CI 2.5.  Weaning Neo. On  ASA, BB, statin.  Mobilize today.  -GI- nausea for several hours, no vomiting. Abd exam benign. PPI and Reglan ordered. Cl liquids for now.   -PULM- normal WOB on 2L [place] O2. Encourage pulm hygiene.   -ENDO- No h/o DM, pre-op A1C 5.5.   Glucose well controlled. Transition to [facility] / HS CBGs and SSI.   -RENAL- normal function at baseline.  Wt ~ 3kg+. Hold off on diuresis until off Neo.   -DVT PPX- ambulate, daily enoxaparin.     Leary Roca, PA-C 10/13/2023 8:25 AM   Agree with  above POD1 progression  Jamontae Thwaites O Shail Urbas

## 2023-10-13 NOTE — Progress Notes (Signed)
NAME:  Leroy Proctor, MRN:  [id number], DOB:  09-18-1951, LOS: 3 ADMISSION DATE:  10/10/2023, CONSULTATION DATE:  10/12/23 REFERRING MD:  Cliffton Asters, CHIEF COMPLAINT:  s/p CABG   History of Present Illness:  72 yo M PMH angina, LBBB, HTN, who presented 11/5 for elective L heart cath in the wake of an elevated calcium scote (90% percentile for age) . Was found to have severe 3v disease; CVTS was consulted for CABG eval.  On 11/7, underwent CABG x3. Admitted to ICU post op. PCCM consulted in this setting   Pre op had some angina and diaphoresis  Case unremarkable  Xclamp- 42 min Total pump- 76 min EBL - 915  Product -460 cellsaver   Pertinent  Medical History  HTN LBBB Angina mvCAD  Significant Hospital Events: Including procedures, antibiotic start and stop dates in addition to other pertinent events   11/5 elective LHC, severe 3v disease. CVTS consult 11/7 CABG x3   Interim History / Subjective:   Doing well post op. No issues   Objective   Blood pressure 122/76, pulse (!) 58, temperature 97.7 F (36.5 C), temperature source Oral, resp. rate 12, height 5\' 8"  (1.727 m), weight 91.9 kg, SpO2 97%. CVP:  [2 mmHg] 2 mmHg CO:  [2.6 L/min-7.8 L/min] 5 L/min CI:  [1.3 L/min/m2-3.9 L/min/m2] 2.5 L/min/m2  Vent Mode: PSV;CPAP FiO2 (%):  [40 %-50 %] 40 % Set Rate:  [4 bmp-20 bmp] 4 bmp Vt Set:  [550 mL] 550 mL PEEP:  [5 cmH20] 5 cmH20 Pressure Support:  [10 cmH20] 10 cmH20 Plateau Pressure:  [10 cmH20] 10 cmH20   Intake/Output Summary (Last 24 hours) at 10/13/2023 0910 Last data filed at 10/13/2023 0800 Gross per 24 hour  Intake 5590.17 ml  Output 3045 ml  Net 2545.17 ml   Filed Weights   10/10/23 1025 10/12/23 0637 10/13/23 0500  Weight: 88.5 kg 88.5 kg 91.9 kg    Examination: General: older male, resting in bed HENT: R internal jugular catheter  Lungs: BL vented breaths  Cardiovascular: RRR, s1 s2  Abdomen: soft nd nt  Extremities: no edema  Neuro: alert following  commands   Resolved Hospital Problem list     Assessment & Plan:   3vCAD s/p CABG x3  Endotracheally intubated Expected ABLA  HTN  HLD Hx LBBB GERD Hx dysphagia P Drain and line management per TCTS I suspect we can start removing them today  Transition from insulin drip today  Up in chair is goal    Best Practice (right click and "Reselect all SmartList Selections" daily)   Diet/type: NPO DVT prophylaxis: not indicated completing txa  GI prophylaxis: PPI Lines: Central line, Arterial Line, and yes and it is still needed Foley:  Yes, and it is still needed Code Status:  full code Last date of multidisciplinary goals of care discussion [--]  Labs   CBC: Recent Labs  Lab 10/11/23 0449 10/12/23 0217 10/12/23 0831 10/12/23 1048 10/12/23 1114 10/12/23 1300 10/12/23 1305 10/12/23 1803 10/12/23 1916 10/12/23 1918 10/13/23 0437  WBC 6.1 6.2  --   --   --  8.7  --   --  6.6  --  10.3  HGB 12.7* 13.0   < > 10.0*   < > 10.6* 11.2* 9.9* 10.0* 10.2* 11.0*  HCT 38.6* 38.5*   < > 29.7*   < > 32.1* 33.0* 29.0* 30.6* 30.0* 32.1*  MCV 86.5 85.7  --   --   --  86.5  --   --  87.4  --  87.2  PLT 227 239  --  210  --  165  --   --  141*  --  186   < > = values in this interval not displayed.    Basic Metabolic Panel: Recent Labs  Lab 10/12/23 0217 10/12/23 0831 10/12/23 0949 10/12/23 1021 10/12/23 1046 10/12/23 1114 10/12/23 1148 10/12/23 1305 10/12/23 1803 10/12/23 1916 10/12/23 1918 10/13/23 0437  NA 135   < > 139   < > 139   < > 139 140 139 137 139 136  K 3.6   < > 3.6   < > 4.2   < > 4.0 3.8 4.1 3.9 4.0 3.6  CL 104   < > 103  --  103  --  104  --   --  105  --  108  CO2 22  --   --   --   --   --   --   --   --  20*  --  23  GLUCOSE 96   < > 139*  --  125*  --  161*  --   --  114*  --  131*  BUN 20   < > 18  --  18  --  17  --   --  12  --  12  CREATININE 1.01   < > 0.80  --  0.70  --  0.70  --   --  0.88  --  0.82  CALCIUM 8.5*  --   --   --   --   --   --    --   --  7.6*  --  7.5*  MG  --   --   --   --   --   --   --   --   --   --   --  2.1   < > = values in this interval not displayed.   GFR: Estimated Creatinine Clearance: 89.6 mL/min (by C-G formula based on SCr of 0.82 mg/dL). Recent Labs  Lab 10/12/23 0217 10/12/23 1300 10/12/23 1916 10/13/23 0437  WBC 6.2 8.7 6.6 10.3    Liver Function Tests: No results for input(s): "AST", "ALT", "ALKPHOS", "BILITOT", "PROT", "ALBUMIN" in the last 168 hours. No results for input(s): "LIPASE", "AMYLASE" in the last 168 hours. No results for input(s): "AMMONIA" in the last 168 hours.  ABG    Component Value Date/Time   PHART 7.332 (L) 10/12/2023 1918   PCO2ART 39.6 10/12/2023 1918   PO2ART 58 (L) 10/12/2023 1918   HCO3 20.9 10/12/2023 1918   TCO2 22 10/12/2023 1918   ACIDBASEDEF 5.0 (H) 10/12/2023 1918   O2SAT 87 10/12/2023 1918     Coagulation Profile: Recent Labs  Lab 10/11/23 1825 10/12/23 1300  INR 1.1 1.5*    Cardiac Enzymes: No results for input(s): "CKTOTAL", "CKMB", "CKMBINDEX", "TROPONINI" in the last 168 hours.  HbA1C: Hgb A1c MFr Bld  Date/Time Value Ref Range Status  10/11/2023 06:25 PM 5.5 4.8 - 5.6 % Final    Comment:    (NOTE) Pre diabetes:          5.7%-6.4%  Diabetes:              >6.4%  Glycemic control for   <7.0% adults with diabetes   04/19/2022 08:30 AM 5.7 4.6 - 6.5 % Final    Comment:    Glycemic Control Guidelines for People with Diabetes:Non Diabetic:  <  6%Goal of Therapy: <7%Additional Action Suggested:  >8%     CBG: Recent Labs  Lab 10/12/23 2352 10/13/23 0229 10/13/23 0441 10/13/23 0628 10/13/23 0906  GLUCAP 133* 134* 135* 134* 109*    Review of Systems:   Unable to obtain, intubated sedated   Past Medical History:  He,  has a past medical history of Allergy, Arthritis, Bradycardia (10/02/2013), Cataract, Chronic renal insufficiency, stage II (mild), Cold sore (06/03/2014), 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   Surgical History:   Past Surgical History:  Procedure Laterality Date   COLONOSCOPY  2002; 2014   Repeat 2024   CORONARY ARTERY BYPASS GRAFT N/A 10/12/2023   Procedure: CORONARY ARTERY BYPASS GRAFTING (CABG) X THREE, USING LEFT INTERNAL MAMMARY ARTERY AND RIGHT GREATER SAPHENEOUS VEIN HARVESTED ENDOSCOPICLY;  Surgeon: Corliss Skains, MD;  Location: [facility] OR;  Service: Open Heart Surgery;  Laterality: N/A;   LEFT HEART CATH AND CORONARY ANGIOGRAPHY N/A 10/10/2023   Procedure: LEFT HEART CATH AND CORONARY ANGIOGRAPHY;  Surgeon: Marykay Lex, MD;  Location: [facility] INVASIVE CV LAB;  Service: Cardiovascular;  Laterality: N/A;   LITHOTRIPSY     TEE WITHOUT CARDIOVERSION N/A 10/12/2023   Procedure: TRANSESOPHAGEAL ECHOCARDIOGRAM;  Surgeon: Corliss Skains, MD;  Location: [facility] OR;  Service: Open Heart Surgery;  Laterality: N/A;     Social History:   reports that he has quit smoking. His smoking use included cigarettes. He has a 7.5 pack-year smoking history. He has never used smokeless tobacco. He reports current alcohol use of about 13.0 standard drinks of alcohol per week. He reports that he does not use drugs.   Family History:  His family history includes Diabetes in his father; Hearing loss in his mother. There is no history of Colon cancer, Stomach cancer, or Esophageal cancer.   Allergies Allergies  Allergen Reactions   Atenolol     bradycarda- heart rate down to 38 bpm   Demerol Nausea And Vomiting     Home Medications  Prior to Admission medications    Medication Sig Start Date End Date Taking? Authorizing Provider  amLODipine (NORVASC) 10 MG tablet Take 1 tablet (10 mg total) by mouth daily. 06/19/23  Yes Saguier, Kateri Mc  aspirin EC 81 MG tablet Take 1 tablet (81 mg total) by mouth daily. Swallow whole. 09/19/23  Yes Madireddy, Marlyn Corporal, MD  atorvastatin (LIPITOR) 40 MG tablet Take 1 tablet (40 mg total) by mouth daily. 10/05/23 01/03/24 Yes Madireddy, Marlyn Corporal, MD  meloxicam (MOBIC) 15 MG tablet Take 15 mg by mouth daily. 08/10/23  Yes [provider]  metoprolol tartrate (LOPRESSOR) 25 MG tablet Take 1 tablet (25 mg total) by mouth 2 (two) times daily. 09/19/23  Yes Madireddy, Marlyn Corporal, MD  omeprazole (PRILOSEC) 20 MG capsule TAKE 1 CAPSULE BY MOUTH EVERY MORNING 30 MINUTES BEFORE BREAKFAST 06/19/23  Yes Saguier, Ramon Dredge, PA-C  tadalafil (CIALIS) 20 MG tablet TAKE 1/2 TO 1 TABLET BY MOUTH EVERY OTHER DAY AS NEEDED FOR ERECTILE DYSFUNCTION 08/22/23  Yes Saguier, Ramon Dredge, PA-C     Josephine Igo, DO [facility] 10/13/2023 9:10 AM

## 2023-10-14 ENCOUNTER — Inpatient Hospital Stay (HOSPITAL_COMMUNITY): Payer: Medicare PPO

## 2023-10-14 DIAGNOSIS — I200 Unstable angina: Secondary | ICD-10-CM | POA: Diagnosis not present

## 2023-10-14 LAB — CBC
HCT: 31.3 % — ABNORMAL LOW (ref 39.0–52.0)
Hemoglobin: 10.2 g/dL — ABNORMAL LOW (ref 13.0–17.0)
MCH: 29.1 pg (ref 26.0–34.0)
MCHC: 32.6 g/dL (ref 30.0–36.0)
MCV: 89.4 fL (ref 80.0–100.0)
Platelets: 190 10*3/uL (ref 150–400)
RBC: 3.5 MIL/uL — ABNORMAL LOW (ref 4.22–5.81)
RDW: 13.3 % (ref 11.5–15.5)
WBC: 11.2 10*3/uL — ABNORMAL HIGH (ref 4.0–10.5)
nRBC: 0 % (ref 0.0–0.2)

## 2023-10-14 LAB — BASIC METABOLIC PANEL
Anion gap: 7 (ref 5–15)
BUN: 22 mg/dL (ref 8–23)
CO2: 26 mmol/L (ref 22–32)
Calcium: 8.6 mg/dL — ABNORMAL LOW (ref 8.9–10.3)
Chloride: 104 mmol/L (ref 98–111)
Creatinine, Ser: 0.73 mg/dL (ref 0.61–1.24)
GFR, Estimated: >60 mL/min (ref >60)
Glucose, Bld: 112 mg/dL — ABNORMAL HIGH (ref 70–99)
Potassium: 4.4 mmol/L (ref 3.5–5.1)
Sodium: 137 mmol/L (ref 135–145)

## 2023-10-14 LAB — GLUCOSE, CAPILLARY
Glucose-Capillary: 102 mg/dL — ABNORMAL HIGH (ref 70–99)
Glucose-Capillary: 107 mg/dL — ABNORMAL HIGH (ref 70–99)
Glucose-Capillary: 112 mg/dL — ABNORMAL HIGH (ref 70–99)
Glucose-Capillary: 113 mg/dL — ABNORMAL HIGH (ref 70–99)
Glucose-Capillary: 130 mg/dL — ABNORMAL HIGH (ref 70–99)
Glucose-Capillary: 136 mg/dL — ABNORMAL HIGH (ref 70–99)

## 2023-10-14 MED ORDER — POTASSIUM CHLORIDE CRYS ER 20 MEQ PO TBCR
40.0000 meq | EXTENDED_RELEASE_TABLET | Freq: Every day | ORAL | Status: DC
  Administered 2023-10-14 – 2023-10-16 (×3): 40 meq via ORAL
  Filled 2023-10-14 (×2): qty 2

## 2023-10-14 MED ORDER — FUROSEMIDE 40 MG PO TABS
40.0000 mg | ORAL_TABLET | Freq: Every day | ORAL | Status: DC
  Administered 2023-10-14 – 2023-10-16 (×3): 40 mg via ORAL
  Filled 2023-10-14 (×2): qty 1

## 2023-10-14 MED ORDER — ~~LOC~~ CARDIAC SURGERY, PATIENT & FAMILY EDUCATION: Freq: Once | Status: AC

## 2023-10-14 NOTE — Progress Notes (Signed)
      [address]       [place] [address]             [phone]                 2 Days Post-Op Procedure(s) (LRB): CORONARY ARTERY BYPASS GRAFTING (CABG) X THREE, USING LEFT INTERNAL MAMMARY ARTERY AND RIGHT GREATER SAPHENEOUS VEIN HARVESTED ENDOSCOPICLY (N/A) TRANSESOPHAGEAL ECHOCARDIOGRAM (N/A)   Events: No events _______________________________________________________________ Vitals: BP 120/62 (BP Location: Left Arm)   Pulse (!) 58   Temp 98.1 F (36.7 C) (Oral)   Resp 13   Ht 5\' 8"  (1.727 m)   Wt 90.8 kg   SpO2 97%   BMI 30.44 kg/m  Filed Weights   10/12/23 0637 10/13/23 0500 10/14/23 0500  Weight: 88.5 kg 91.9 kg 90.8 kg     - Neuro: alert NAD  - Cardiovascular: sinus  Drips: none.   CO:  [4.8 L/min-5.7 L/min] 5.3 L/min CI:  [2.4 L/min/m2-2.8 L/min/m2] 2.6 L/min/m2  - Pulm: EWOB    ABG    Component Value Date/Time   PHART 7.332 (L) 10/12/2023 1918   PCO2ART 39.6 10/12/2023 1918   PO2ART 58 (L) 10/12/2023 1918   HCO3 20.9 10/12/2023 1918   TCO2 22 10/12/2023 1918   ACIDBASEDEF 5.0 (H) 10/12/2023 1918   O2SAT 87 10/12/2023 1918    - Abd: ND - Extremity: trace edema  .Intake/Output      11/08 0701 11/09 0700 11/09 0701 11/10 0700   P.O. 60    I.V. (mL/kg) 144.2 (1.6)    Blood     IV Piggyback 445.8    Total Intake(mL/kg) 650 (7.2)    Urine (mL/kg/hr) 375 (0.2) 35 (0.1)   Blood     Chest Tube 130 30   Total Output 505 65   Net +145 -65           _______________________________________________________________ Labs:    Latest Ref Rng & Units 10/14/2023    4:00 AM 10/13/2023    4:50 PM 10/13/2023    4:37 AM  CBC  WBC 4.0 - 10.5 K/uL 11.2  16.0  10.3   Hemoglobin 13.0 - 17.0 g/dL 69.6  29.5  28.4   Hematocrit 39.0 - 52.0 % 31.3  35.6  32.1   Platelets 150 - 400 K/uL 190  237  186       Latest Ref Rng & Units 10/14/2023    4:00 AM 10/13/2023    4:50 PM 10/13/2023    4:37 AM  CMP  Glucose 70 - 99 mg/dL 132  440   102   BUN 8 - 23 mg/dL 22  16  12    Creatinine 0.61 - 1.24 mg/dL 7.25  3.66  4.40   Sodium 135 - 145 mmol/L 137  137  136   Potassium 3.5 - 5.1 mmol/L 4.4  4.4  3.6   Chloride 98 - 111 mmol/L 104  105  108   CO2 22 - 32 mmol/L 26  25  23    Calcium 8.9 - 10.3 mg/dL 8.6  8.1  7.5     CXR: PV congestion  _______________________________________________________________  Assessment and Plan: POD 2 s/p CABG  Neuro: pain controlled CV: will remove epicardial wires.  On A/S/BB Pulm: IS, ambulation Renal: creat stable.  Will diurese GI: on diet Heme: stable ID: [email or web address] Endo: SSI Dispo: floor   Kadyn Guild O Shaira Sova 10/14/2023 9:44 AM

## 2023-10-14 NOTE — Progress Notes (Signed)
This nurse discontinued pts chest tubes per MD order. Pt tolerated well, no complications noted at this time.   Christain [place], RN

## 2023-10-14 NOTE — Progress Notes (Addendum)
NAME:  Leroy Proctor, MRN:  [id number], DOB:  [birth year], LOS: 4 ADMISSION DATE:  10/10/2023, CONSULTATION DATE:  10/12/23 REFERRING MD:  Cliffton Asters, CHIEF COMPLAINT:  s/p CABG   History of Present Illness:  72 yo M PMH angina, LBBB, HTN, who presented 11/5 for elective L heart cath in the wake of an elevated calcium scote (90% percentile for age) . Was found to have severe 3v disease; CVTS was consulted for CABG eval.  On 11/7, underwent CABG x3. Admitted to ICU post op. PCCM consulted in this setting   Pre op had some angina and diaphoresis  Case unremarkable  Xclamp- 42 min Total pump- 76 min EBL - 915  Product -460 cellsaver   Pertinent  Medical History  HTN LBBB Angina mvCAD  Significant Hospital Events: Including procedures, antibiotic start and stop dates in addition to other pertinent events   11/5 elective LHC, severe 3v disease. CVTS consult 11/7 CABG x3   Interim History / Subjective:   No issues post op. Up in chair this AM   Objective   Blood pressure (!) 143/69, pulse 70, temperature 98.6 F (37 C), resp. rate 18, height 5\' 8"  (1.727 m), weight 90.8 kg, SpO2 94%. CO:  [4.5 L/min-5.7 L/min] 5.3 L/min CI:  [2.2 L/min/m2-2.8 L/min/m2] 2.6 L/min/m2      Intake/Output Summary (Last 24 hours) at 10/14/2023 2952 Last data filed at 10/14/2023 0600 Gross per 24 hour  Intake 590.85 ml  Output 475 ml  Net 115.85 ml   Filed Weights   10/12/23 0637 10/13/23 0500 10/14/23 0500  Weight: 88.5 kg 91.9 kg 90.8 kg    Examination: General: older gentleman, hard of hearing, sitting up in chair  HENT: NCAT tracking  Lungs: BL vented breaths Cardiovascular: RRR s1 s2  Abdomen: soft nt nd  Extremities: no edema   Neuro: alert following commands   Resolved Hospital Problem list     Assessment & Plan:   3vCAD s/p CABG x3  Endotracheally intubated Expected ABLA  HTN  HLD Hx LBBB GERD Hx dysphagia P: Continue mobility Remove unneeded lines  ?drains out today,  minimal output, 130cc in 24hr Progressing well I suspect can move from icu soon  I will discuss with Dr. Cliffton Asters    Best Practice (right click and "Reselect all SmartList Selections" daily)   Diet/type: NPO DVT prophylaxis: not indicated completing txa  GI prophylaxis: PPI Lines: Central line, Arterial Line, and yes and it is still needed Foley:  Yes, and it is still needed Code Status:  full code Last date of multidisciplinary goals of care discussion [--]  Labs   CBC: Recent Labs  Lab 10/12/23 1300 10/12/23 1305 10/12/23 1916 10/12/23 1918 10/13/23 0437 10/13/23 1650 10/14/23 0400  WBC 8.7  --  6.6  --  10.3 16.0* 11.2*  HGB 10.6*   < > 10.0* 10.2* 11.0* 11.5* 10.2*  HCT 32.1*   < > 30.6* 30.0* 32.1* 35.6* 31.3*  MCV 86.5  --  87.4  --  87.2 89.7 89.4  PLT 165  --  141*  --  186 237 190   < > = values in this interval not displayed.    Basic Metabolic Panel: Recent Labs  Lab 10/12/23 0217 10/12/23 0831 10/12/23 1148 10/12/23 1305 10/12/23 1916 10/12/23 1918 10/13/23 0437 10/13/23 1650 10/14/23 0400  NA 135   < > 139   < > 137 139 136 137 137  K 3.6   < > 4.0   < >  3.9 4.0 3.6 4.4 4.4  CL 104   < > 104  --  105  --  108 105 104  CO2 22  --   --   --  20*  --  23 25 26   GLUCOSE 96   < > 161*  --  114*  --  131* 160* 112*  BUN 20   < > 17  --  12  --  12 16 22   CREATININE 1.01   < > 0.70  --  0.88  --  0.82 1.16 0.73  CALCIUM 8.5*  --   --   --  7.6*  --  7.5* 8.1* 8.6*  MG  --   --   --   --   --   --  2.1 2.2  --    < > = values in this interval not displayed.   GFR: Estimated Creatinine Clearance: 91.4 mL/min (by C-G formula based on SCr of 0.73 mg/dL). Recent Labs  Lab 10/12/23 1916 10/13/23 0437 10/13/23 1650 10/14/23 0400  WBC 6.6 10.3 16.0* 11.2*    Liver Function Tests: No results for input(s): "AST", "ALT", "ALKPHOS", "BILITOT", "PROT", "ALBUMIN" in the last 168 hours. No results for input(s): "LIPASE", "AMYLASE" in the last 168  hours. No results for input(s): "AMMONIA" in the last 168 hours.  ABG    Component Value Date/Time   PHART 7.332 (L) 10/12/2023 1918   PCO2ART 39.6 10/12/2023 1918   PO2ART 58 (L) 10/12/2023 1918   HCO3 20.9 10/12/2023 1918   TCO2 22 10/12/2023 1918   ACIDBASEDEF 5.0 (H) 10/12/2023 1918   O2SAT 87 10/12/2023 1918     Coagulation Profile: Recent Labs  Lab 10/11/23 1825 10/12/23 1300  INR 1.1 1.5*    Cardiac Enzymes: No results for input(s): "CKTOTAL", "CKMB", "CKMBINDEX", "TROPONINI" in the last 168 hours.  HbA1C: Hgb A1c MFr Bld  Date/Time Value Ref Range Status  10/11/2023 06:25 PM 5.5 4.8 - 5.6 % Final    Comment:    (NOTE) Pre diabetes:          5.7%-6.4%  Diabetes:              >6.4%  Glycemic control for   <7.0% adults with diabetes   04/19/2022 08:30 AM 5.7 4.6 - 6.5 % Final    Comment:    Glycemic Control Guidelines for People with Diabetes:Non Diabetic:  <6%Goal of Therapy: <7%Additional Action Suggested:  >8%     CBG: Recent Labs  Lab 10/13/23 1610 10/13/23 2001 10/13/23 2340 10/14/23 0317 10/14/23 0725  GLUCAP 161* 181* 134* 136* 130*    Review of Systems:   Unable to obtain, intubated sedated   Past Medical History:  He,  has a past medical history of Allergy, Arthritis, Bradycardia (10/02/2013), Cataract, Chronic renal insufficiency, stage II (mild), Cold sore (06/03/2014), 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   Surgical History:   Past Surgical History:  Procedure Laterality Date   COLONOSCOPY   2002; 2014   Repeat 2024   CORONARY ARTERY BYPASS GRAFT N/A 10/12/2023   Procedure: CORONARY ARTERY BYPASS GRAFTING (CABG) X THREE, USING LEFT INTERNAL MAMMARY ARTERY AND RIGHT GREATER SAPHENEOUS VEIN HARVESTED ENDOSCOPICLY;  Surgeon: Brynda Greathouse  O, MD;  Location: [facility] OR;  Service: Open Heart Surgery;  Laterality: N/A;   LEFT HEART CATH AND CORONARY ANGIOGRAPHY N/A 10/10/2023   Procedure: LEFT HEART CATH AND CORONARY ANGIOGRAPHY;  Surgeon: Marykay Lex, MD;  Location: [facility] INVASIVE CV LAB;  Service: Cardiovascular;  Laterality: N/A;   LITHOTRIPSY     TEE WITHOUT CARDIOVERSION N/A 10/12/2023   Procedure: TRANSESOPHAGEAL ECHOCARDIOGRAM;  Surgeon: Corliss Skains, MD;  Location: [facility] OR;  Service: Open Heart Surgery;  Laterality: N/A;     Social History:   reports that he has quit smoking. His smoking use included cigarettes. He has a 7.5 pack-year smoking history. He has never used smokeless tobacco. He reports current alcohol use of about 13.0 standard drinks of alcohol per week. He reports that he does not use drugs.   Family History:  His family history includes Diabetes in his father; Hearing loss in his mother. There is no history of Colon cancer, Stomach cancer, or Esophageal cancer.   Allergies Allergies  Allergen Reactions   Atenolol     bradycarda- heart rate down to 38 bpm   Demerol Nausea And Vomiting     Home Medications  Prior to Admission medications   Medication Sig Start Date End Date Taking? Authorizing Provider  amLODipine (NORVASC) 10 MG tablet Take 1 tablet (10 mg total) by mouth daily. 06/19/23  Yes Saguier, Kateri Mc  aspirin EC 81 MG tablet Take 1 tablet (81 mg total) by mouth daily. Swallow whole. 09/19/23  Yes Madireddy, Marlyn Corporal, MD  atorvastatin (LIPITOR) 40 MG tablet Take 1 tablet (40 mg total) by mouth daily. 10/05/23 01/03/24 Yes Madireddy, Marlyn Corporal, MD  meloxicam (MOBIC) 15 MG tablet Take 15 mg by mouth daily. 08/10/23  Yes [provider]  metoprolol tartrate (LOPRESSOR) 25 MG tablet Take 1 tablet (25 mg total) by mouth 2 (two) times daily. 09/19/23  Yes Madireddy, Marlyn Corporal, MD  omeprazole (PRILOSEC) 20 MG capsule TAKE 1 CAPSULE BY MOUTH EVERY MORNING 30 MINUTES BEFORE BREAKFAST 06/19/23  Yes Saguier, Ramon Dredge, PA-C  tadalafil (CIALIS) 20 MG tablet TAKE 1/2 TO 1 TABLET BY MOUTH EVERY OTHER DAY AS NEEDED FOR ERECTILE DYSFUNCTION 08/22/23  Yes Saguier, Ramon Dredge, PA-C     Josephine Igo, DO [facility] 10/14/2023 8:07 AM

## 2023-10-14 NOTE — Plan of Care (Signed)
  Problem: Education: Goal: Understanding of CV disease, CV risk reduction, and recovery process will improve Outcome: Progressing Goal: Individualized Educational Video(s) Outcome: Progressing   Problem: Activity: Goal: Ability to return to baseline activity level will improve Outcome: Progressing   Problem: Cardiovascular: Goal: Ability to achieve and maintain adequate cardiovascular perfusion will improve Outcome: Progressing Goal: Vascular access site(s) Level 0-1 will be maintained Outcome: Progressing   Problem: Health Behavior/Discharge Planning: Goal: Ability to safely manage health-related needs after discharge will improve Outcome: Progressing   Problem: Clinical Measurements: Goal: Ability to maintain clinical measurements within normal limits will improve Outcome: Progressing Goal: Will remain free from infection Outcome: Progressing Goal: Diagnostic test results will improve Outcome: Progressing Goal: Respiratory complications will improve Outcome: Progressing Goal: Cardiovascular complication will be avoided Outcome: Progressing   Problem: Health Behavior/Discharge Planning: Goal: Ability to manage health-related needs will improve Outcome: Progressing   Problem: Coping: Goal: Level of anxiety will decrease Outcome: Progressing   Problem: Nutrition: Goal: Adequate nutrition will be maintained Outcome: Progressing   Problem: Elimination: Goal: Will not experience complications related to bowel motility Outcome: Progressing Goal: Will not experience complications related to urinary retention Outcome: Progressing

## 2023-10-14 NOTE — Progress Notes (Signed)
Pacing wires removed per MD order. 4 beat run of ventricular tachycardia was noted on the monitor, and then pt converted to sinus rhythm with bundle branch block. Pt on bedrest x1hr. VS per protocol.    Christain [place], RN

## 2023-10-15 LAB — CBC
HCT: 30.9 % — ABNORMAL LOW (ref 39.0–52.0)
Hemoglobin: 10.2 g/dL — ABNORMAL LOW (ref 13.0–17.0)
MCH: 29.6 pg (ref 26.0–34.0)
MCHC: 33 g/dL (ref 30.0–36.0)
MCV: 89.6 fL (ref 80.0–100.0)
Platelets: 176 10*3/uL (ref 150–400)
RBC: 3.45 MIL/uL — ABNORMAL LOW (ref 4.22–5.81)
RDW: 13.2 % (ref 11.5–15.5)
WBC: 8.6 10*3/uL (ref 4.0–10.5)
nRBC: 0 % (ref 0.0–0.2)

## 2023-10-15 LAB — GLUCOSE, CAPILLARY
Glucose-Capillary: 103 mg/dL — ABNORMAL HIGH (ref 70–99)
Glucose-Capillary: 131 mg/dL — ABNORMAL HIGH (ref 70–99)
Glucose-Capillary: 113 mg/dL — ABNORMAL HIGH (ref 70–99)
Glucose-Capillary: 113 mg/dL — ABNORMAL HIGH (ref 70–99)
Glucose-Capillary: 105 mg/dL — ABNORMAL HIGH (ref 70–99)

## 2023-10-15 LAB — BASIC METABOLIC PANEL
Anion gap: 10 (ref 5–15)
BUN: 22 mg/dL (ref 8–23)
CO2: 22 mmol/L (ref 22–32)
Calcium: 8.1 mg/dL — ABNORMAL LOW (ref 8.9–10.3)
Chloride: 105 mmol/L (ref 98–111)
Creatinine, Ser: 1.03 mg/dL (ref 0.61–1.24)
GFR, Estimated: >60 mL/min (ref >60)
Glucose, Bld: 101 mg/dL — ABNORMAL HIGH (ref 70–99)
Potassium: 4 mmol/L (ref 3.5–5.1)
Sodium: 137 mmol/L (ref 135–145)

## 2023-10-15 MED ORDER — AMIODARONE LOAD VIA INFUSION
150.0000 mg | Freq: Once | INTRAVENOUS | Status: AC
  Administered 2023-10-15: 150 mg via INTRAVENOUS
  Filled 2023-10-15: qty 83.34

## 2023-10-15 MED ORDER — AMIODARONE HCL IN DEXTROSE 360-4.14 MG/200ML-% IV SOLN
30.0000 mg/h | INTRAVENOUS | Status: DC
  Administered 2023-10-15 (×2): 30 mg/h via INTRAVENOUS
  Filled 2023-10-15 (×2): qty 200

## 2023-10-15 MED ORDER — AMIODARONE HCL IN DEXTROSE 360-4.14 MG/200ML-% IV SOLN
60.0000 mg/h | INTRAVENOUS | Status: AC
  Administered 2023-10-15: 60 mg/h via INTRAVENOUS
  Filled 2023-10-15: qty 200

## 2023-10-15 NOTE — Plan of Care (Signed)
  Problem: Education: Goal: Understanding of CV disease, CV risk reduction, and recovery process will improve Outcome: Progressing Goal: Individualized Educational Video(s) Outcome: Progressing   Problem: Activity: Goal: Ability to return to baseline activity level will improve Outcome: Progressing   Problem: Cardiovascular: Goal: Ability to achieve and maintain adequate cardiovascular perfusion will improve Outcome: Progressing Goal: Vascular access site(s) Level 0-1 will be maintained Outcome: Progressing   Problem: Health Behavior/Discharge Planning: Goal: Ability to safely manage health-related needs after discharge will improve Outcome: Progressing   Problem: Education: Goal: Knowledge of General Education information will improve Description: Including pain rating scale, medication(s)/side effects and non-pharmacologic comfort measures Outcome: Progressing   Problem: Clinical Measurements: Goal: Ability to maintain clinical measurements within normal limits will improve Outcome: Progressing Goal: Will remain free from infection Outcome: Progressing Goal: Diagnostic test results will improve Outcome: Progressing Goal: Respiratory complications will improve Outcome: Progressing Goal: Cardiovascular complication will be avoided Outcome: Progressing

## 2023-10-15 NOTE — Progress Notes (Addendum)
3 Days Post-Op Procedure(s) (LRB): CORONARY ARTERY BYPASS GRAFTING (CABG) X THREE, USING LEFT INTERNAL MAMMARY ARTERY AND RIGHT GREATER SAPHENEOUS VEIN HARVESTED ENDOSCOPICLY (N/A) TRANSESOPHAGEAL ECHOCARDIOGRAM (N/A) Subjective: Transferred from ICU yesterday.  Up in the bedside chair.  Feels he is progressing but is having some dizziness.  On room air Passing gas, no BM yet.  Objective: Vital signs in last 24 hours: Temp:  [97.9 F (36.6 C)-98.7 F (37.1 C)] 97.9 F (36.6 C) (11/10 0605) Pulse Rate:  [55-104] 88 (11/10 0605) Cardiac Rhythm: Atrial fibrillation;Bundle branch block (11/10 0706) Resp:  [12-19] 12 (11/10 0605) BP: (114-156)/(63-78) 119/76 (11/10 0605) SpO2:  [90 %-96 %] 93 % (11/10 0605) Weight:  [89.7 kg] 89.7 kg (11/10 0605)  Hemodynamic parameters for last 24 hours:    Intake/Output from previous day: 11/09 0701 - 11/10 0700 In: -  Out: 415 [Urine:385; Chest Tube:30] Intake/Output this shift: No intake/output data recorded.  General appearance: alert, cooperative, and mild distress Neurologic: intact Heart: irregularly irregular rhythm. Monitor showing atrial fibrillation starting last PM.  Lungs: breath sounds are clear, shallow, normal work of breathing on RA Abdomen: soft, no tenderness, active bowel sounds Extremities: no peripheral edema. The RLE EVH incision is dry.  Wound: the sternotomy incision is covered with a dry dressing.   Lab Results: Recent Labs    10/14/23 0400 10/15/23 0336  WBC 11.2* 8.6  HGB 10.2* 10.2*  HCT 31.3* 30.9*  PLT 190 176   BMET:  Recent Labs    10/14/23 0400 10/15/23 0336  NA 137 137  K 4.4 4.0  CL 104 105  CO2 26 22  GLUCOSE 112* 101*  BUN 22 22  CREATININE 0.73 1.03  CALCIUM 8.6* 8.1*    PT/INR:  Recent Labs    10/12/23 1300  LABPROT 18.5*  INR 1.5*   ABG    Component Value Date/Time   PHART 7.332 (L) 10/12/2023 1918   HCO3 20.9 10/12/2023 1918   TCO2 22 10/12/2023 1918   ACIDBASEDEF 5.0  (H) 10/12/2023 1918   O2SAT 87 10/12/2023 1918   CBG (last 3)  Recent Labs    10/14/23 2129 10/14/23 2343 10/15/23 0351  GLUCAP 107* 102* 103*    Assessment/Plan: S/P Procedure(s) (LRB): CORONARY ARTERY BYPASS GRAFTING (CABG) X THREE, USING LEFT INTERNAL MAMMARY ARTERY AND RIGHT GREATER SAPHENEOUS VEIN HARVESTED ENDOSCOPICLY (N/A) TRANSESOPHAGEAL ECHOCARDIOGRAM (N/A)  -POD3 CABG x 3. Normal EF. Stable VS, on ASA, statin, BB. Ambulating.   -Atrial fibrillation- new onset since last PM. VR 90s -100's.  K+ 4.0. Load with IV amiodarone.    -PULM- comfortable on RA.   -GI- abd benign but appetite still poor.   -ENDO-  remote h/o pre-diabetes but A1C last week was 5.5.. No insulin coverage required past 48 hrs. Will discontinue CBGs and SSI.   -DVT PPX- ambulate, enoxaparin.    LOS: 5 days    Parke Poisson [phone] 10/15/2023   Agree with above Doing well On amio for afib PT/OT Dispo planning  Leeroy Lovings O Tysha Grismore

## 2023-10-15 NOTE — Plan of Care (Signed)
  Problem: Activity: Goal: Ability to return to baseline activity level will improve Outcome: Progressing   Problem: Cardiovascular: Goal: Ability to achieve and maintain adequate cardiovascular perfusion will improve Outcome: Progressing Goal: Vascular access site(s) Level 0-1 will be maintained Outcome: Progressing   Problem: Health Behavior/Discharge Planning: Goal: Ability to safely manage health-related needs after discharge will improve Outcome: Progressing   Problem: Education: Goal: Knowledge of General Education information will improve Description: Including pain rating scale, medication(s)/side effects and non-pharmacologic comfort measures Outcome: Progressing

## 2023-10-16 ENCOUNTER — Other Ambulatory Visit: Payer: Self-pay

## 2023-10-16 ENCOUNTER — Ambulatory Visit: Payer: Medicare PPO

## 2023-10-16 LAB — BASIC METABOLIC PANEL
Anion gap: 10 (ref 5–15)
BUN: 22 mg/dL (ref 8–23)
CO2: 25 mmol/L (ref 22–32)
Calcium: 8.2 mg/dL — ABNORMAL LOW (ref 8.9–10.3)
Chloride: 103 mmol/L (ref 98–111)
Creatinine, Ser: 0.99 mg/dL (ref 0.61–1.24)
GFR, Estimated: >60 mL/min (ref >60)
Glucose, Bld: 96 mg/dL (ref 70–99)
Potassium: 4.1 mmol/L (ref 3.5–5.1)
Sodium: 138 mmol/L (ref 135–145)

## 2023-10-16 LAB — GLUCOSE, CAPILLARY
Glucose-Capillary: 104 mg/dL — ABNORMAL HIGH (ref 70–99)
Glucose-Capillary: 106 mg/dL — ABNORMAL HIGH (ref 70–99)
Glucose-Capillary: 117 mg/dL — ABNORMAL HIGH (ref 70–99)
Glucose-Capillary: 133 mg/dL — ABNORMAL HIGH (ref 70–99)
Glucose-Capillary: 97 mg/dL (ref 70–99)
Glucose-Capillary: 99 mg/dL (ref 70–99)

## 2023-10-16 LAB — MAGNESIUM: Magnesium: 1.9 mg/dL (ref 1.7–2.4)

## 2023-10-16 MED ORDER — AMIODARONE HCL 200 MG PO TABS
400.0000 mg | ORAL_TABLET | Freq: Two times a day (BID) | ORAL | Status: DC
  Administered 2023-10-16 – 2023-10-18 (×5): 400 mg via ORAL
  Filled 2023-10-16 (×5): qty 2

## 2023-10-16 MED FILL — Heparin Sodium (Porcine) Inj 1000 Unit/ML: Qty: 1000 | Status: AC

## 2023-10-16 MED FILL — Lidocaine HCl Local Preservative Free (PF) Inj 2%: INTRAMUSCULAR | Qty: 14 | Status: AC

## 2023-10-16 MED FILL — Potassium Chloride Inj 2 mEq/ML: INTRAVENOUS | Qty: 40 | Status: AC

## 2023-10-16 NOTE — Care Management Important Message (Signed)
Important Message  Patient Details  Name: Leroy Proctor MRN: [id number] Date of Birth: 05-Sep-1951   Important Message Given:  Yes - Medicare IM     Sherilyn Banker 10/16/2023, 2:55 PM

## 2023-10-16 NOTE — Progress Notes (Signed)
   10/16/23 0027  Provider Notification  Provider Name/Title Dr Cliffton Asters  Date Provider Notified 10/16/23  Time Provider Notified 0025  Method of Notification Call  Notification Reason Change in status (Pt converted to sb on monitor. Amio gtt stopped. EKG obtainted to confirm. MD made aware)  Provider response No new orders  Date of Provider Response 10/16/23  Time of Provider Response [phone]

## 2023-10-16 NOTE — Progress Notes (Signed)
4 Days Post-Op Procedure(s) (LRB): CORONARY ARTERY BYPASS GRAFTING (CABG) X THREE, USING LEFT INTERNAL MAMMARY ARTERY AND RIGHT GREATER SAPHENEOUS VEIN HARVESTED ENDOSCOPICLY (N/A) TRANSESOPHAGEAL ECHOCARDIOGRAM (N/A) Subjective: Feels better but had a bad day with nausea and generally feeling poorly  Objective: Vital signs in last 24 hours: Temp:  [97.6 F (36.4 C)-97.8 F (36.6 C)] 97.6 F (36.4 C) (11/11 0444) Pulse Rate:  [50-86] 56 (11/11 0033) Cardiac Rhythm: Atrial flutter;Bundle branch block (11/10 1940) Resp:  [11-17] 14 (11/11 0033) BP: (101-160)/(70-94) 160/94 (11/11 0444) SpO2:  [89 %-96 %] 96 % (11/11 0033)  Hemodynamic parameters for last 24 hours:    Intake/Output from previous day: 11/10 0701 - 11/11 0700 In: 305.4 [I.V.:305.4] Out: -  Intake/Output this shift: No intake/output data recorded.  General appearance: alert, cooperative, and no distress Heart: regular rate and rhythm Lungs: mildly dim in bases Abdomen: benign Extremities: no edema Wound: incis(EVH) healing well, sternal dressing in place  Lab Results: Recent Labs    10/14/23 0400 10/15/23 0336  WBC 11.2* 8.6  HGB 10.2* 10.2*  HCT 31.3* 30.9*  PLT 190 176   BMET:  Recent Labs    10/15/23 0336 10/16/23 0323  NA 137 138  K 4.0 4.1  CL 105 103  CO2 22 25  GLUCOSE 101* 96  BUN 22 22  CREATININE 1.03 0.99  CALCIUM 8.1* 8.2*    PT/INR: No results for input(s): "LABPROT", "INR" in the last 72 hours. ABG    Component Value Date/Time   PHART 7.332 (L) 10/12/2023 1918   HCO3 20.9 10/12/2023 1918   TCO2 22 10/12/2023 1918   ACIDBASEDEF 5.0 (H) 10/12/2023 1918   O2SAT 87 10/12/2023 1918   CBG (last 3)  Recent Labs    10/15/23 2046 10/16/23 0009 10/16/23 0441  GLUCAP 105* 104* 97    Meds Scheduled Meds:  acetaminophen  1,000 mg Oral Q6H   Or   acetaminophen (TYLENOL) oral liquid 160 mg/5 mL  1,000 mg Per Tube Q6H   aspirin EC  325 mg Oral Daily   Or   aspirin  324 mg  Per Tube Daily   atorvastatin  80 mg Oral Daily   bisacodyl  10 mg Oral Daily   Or   bisacodyl  10 mg Rectal Daily   Chlorhexidine Gluconate Cloth  6 each Topical Daily   docusate  200 mg Per Tube Daily   Or   docusate sodium  200 mg Oral Daily   enoxaparin (LOVENOX) injection  40 mg Subcutaneous QHS   furosemide  40 mg Oral Daily   insulin aspart  0-24 Units Subcutaneous Q4H   metoprolol tartrate  12.5 mg Oral BID   pantoprazole  40 mg Oral Daily   potassium chloride  40 mEq Oral Daily   sodium chloride flush  3 mL Intravenous Q12H   Continuous Infusions:  albumin human Stopped (10/12/23 1710)   amiodarone Stopped (10/16/23 0025)   PRN Meds:.albumin human, metoprolol tartrate, midazolam, morphine injection, ondansetron (ZOFRAN) IV, mouth rinse, oxyCODONE, sodium chloride flush, traMADol  Xrays No results found.  Assessment/Plan: S/P Procedure(s) (LRB): CORONARY ARTERY BYPASS GRAFTING (CABG) X THREE, USING LEFT INTERNAL MAMMARY ARTERY AND RIGHT GREATER SAPHENEOUS VEIN HARVESTED ENDOSCOPICLY (N/A) TRANSESOPHAGEAL ECHOCARDIOGRAM (N/A)  POD#4  1 afeb, has had afib/ aflutter, s BP 100's-160's, mostly on higher end- will add ARB, on amio gtt- sinus now will convert to PO 2 sats good on RA 3 Voiding- not measured, vomited once- normal renal fxn 4 not weighed  yet 5 DVT PPx - lovenox 6 pulm hygiene and rehab modalities 7 likely home 1-2 days    LOS: 6 days    Rowe Clack PA-C Pager [phone] 10/16/2023

## 2023-10-16 NOTE — Progress Notes (Signed)
Mobility Specialist Progress Note:   10/16/23 1414  Mobility  Activity Ambulated with assistance in hallway  Level of Assistance  (MinG)  Assistive Device Front wheel walker  Distance Ambulated (ft) 470 ft  RUE Weight Bearing NWB  LUE Weight Bearing NWB  Activity Response Tolerated well  Mobility Referral Yes  $Mobility charge 1 Mobility  Mobility Specialist Start Time (ACUTE ONLY) 1348  Mobility Specialist Stop Time (ACUTE ONLY) 1405  Mobility Specialist Time Calculation (min) (ACUTE ONLY) 17 min   Pre Mobility: 89 HR , 137/82 BP  During Mobility: 99 HR  Post Mobility: 87 HR   Pt received in bed, agreeable to mobility. Requesting to use BR prior to ambulation. Pt able to stand and walk to BR independently w/o assistive device. Void successful. C/o general weakness during hallway ambulation, otherwise asymptomatic throughout. Pt returned to bed with call bell in reach and all needs met.   Leory Plowman  Mobility Specialist Please contact via [employer] office at [phone]

## 2023-10-16 NOTE — Progress Notes (Signed)
CARDIAC REHAB PHASE I   PRE:  Rate/Rhythm: 86 SR with PACs    BP: sitting 157/80    SpO2: 90 RA  MODE:  Ambulation: 470 ft   POST:  Rate/Rhythm: 100 ST    BP: sitting 164/85     SpO2: 89-91 RA  Pt moved out of bed independently and walked with RW. Steady, tolerated well. To recliner. spO2 borderline however 96 RA in recliner after rest. Encouraged x2 more walks and IS. Progressing well.  [phone]   Ethelda Chick BS, ACSM-CEP 10/16/2023 10:01 AM

## 2023-10-16 NOTE — Plan of Care (Signed)
  Problem: Education: Goal: Understanding of CV disease, CV risk reduction, and recovery process will improve Outcome: Progressing Goal: Individualized Educational Video(s) Outcome: Progressing   Problem: Activity: Goal: Ability to return to baseline activity level will improve Outcome: Progressing   Problem: Cardiovascular: Goal: Ability to achieve and maintain adequate cardiovascular perfusion will improve Outcome: Progressing Goal: Vascular access site(s) Level 0-1 will be maintained Outcome: Progressing   Problem: Health Behavior/Discharge Planning: Goal: Ability to safely manage health-related needs after discharge will improve Outcome: Progressing   Problem: Education: Goal: Knowledge of General Education information will improve Description: Including pain rating scale, medication(s)/side effects and non-pharmacologic comfort measures Outcome: Progressing   Problem: Health Behavior/Discharge Planning: Goal: Ability to manage health-related needs will improve Outcome: Progressing   Problem: Clinical Measurements: Goal: Ability to maintain clinical measurements within normal limits will improve Outcome: Progressing Goal: Will remain free from infection Outcome: Progressing Goal: Diagnostic test results will improve Outcome: Progressing Goal: Respiratory complications will improve Outcome: Progressing Goal: Cardiovascular complication will be avoided Outcome: Progressing   Problem: Activity: Goal: Risk for activity intolerance will decrease Outcome: Progressing   Problem: Pain Management: Goal: General experience of comfort will improve Outcome: Progressing   Problem: Elimination: Goal: Will not experience complications related to bowel motility Outcome: Progressing Goal: Will not experience complications related to urinary retention Outcome: Progressing

## 2023-10-17 LAB — GLUCOSE, CAPILLARY
Glucose-Capillary: 104 mg/dL — ABNORMAL HIGH (ref 70–99)
Glucose-Capillary: 124 mg/dL — ABNORMAL HIGH (ref 70–99)
Glucose-Capillary: 125 mg/dL — ABNORMAL HIGH (ref 70–99)
Glucose-Capillary: 142 mg/dL — ABNORMAL HIGH (ref 70–99)
Glucose-Capillary: 97 mg/dL (ref 70–99)
Glucose-Capillary: 98 mg/dL (ref 70–99)

## 2023-10-17 MED ORDER — LOSARTAN POTASSIUM 25 MG PO TABS
25.0000 mg | ORAL_TABLET | Freq: Every day | ORAL | Status: DC
  Administered 2023-10-17 – 2023-10-18 (×2): 25 mg via ORAL
  Filled 2023-10-17 (×2): qty 1

## 2023-10-17 MED ORDER — METOPROLOL TARTRATE 25 MG PO TABS
25.0000 mg | ORAL_TABLET | Freq: Two times a day (BID) | ORAL | Status: DC
  Administered 2023-10-17 – 2023-10-18 (×3): 25 mg via ORAL
  Filled 2023-10-17 (×3): qty 1

## 2023-10-17 MED ORDER — INSULIN ASPART 100 UNIT/ML IJ SOLN
0.0000 [IU] | Freq: Three times a day (TID) | INTRAMUSCULAR | Status: DC
  Administered 2023-10-17: 2 [IU] via SUBCUTANEOUS

## 2023-10-17 MED ORDER — AMLODIPINE BESYLATE 10 MG PO TABS
10.0000 mg | ORAL_TABLET | Freq: Every day | ORAL | Status: DC
  Administered 2023-10-17 – 2023-10-18 (×2): 10 mg via ORAL
  Filled 2023-10-17 (×2): qty 1

## 2023-10-17 NOTE — TOC Progression Note (Signed)
Transition of Care [facility]) - Progression Note    Patient Details  Name: Leroy Proctor MRN: [id number] Date of Birth: [birth year]  [facility] CM/SW Contact  Glennon Mac, RN Phone Number: 10/17/2023, 10:20 AM  Clinical Narrative:  Patient progressing well with activity; requesting rollator for home use.  Referral to [employer] for RW with seat.                             DME Arranged: Dan Humphreys rolling with seat DME Agency: [employer] Date DME Agency Contacted: 10/17/23 Time DME Agency Contacted: 1020 Representative spoke with at DME Agency: Mickeal Needy             Social Determinants of Health (SDOH) Interventions SDOH Screenings   Food Insecurity: No Food Insecurity (10/10/2023)  Housing: Low Risk  (10/10/2023)  Transportation Needs: No Transportation Needs (10/10/2023)  Utilities: Not At Risk (10/10/2023)  Alcohol Screen: Low Risk  (06/14/2023)  Depression (PHQ2-9): Low Risk  (05/11/2023)  Financial Resource Strain: Low Risk  (05/08/2023)  Physical Activity: Sufficiently Active (05/08/2023)  Social Connections: Socially Isolated (06/14/2023)  Stress: No Stress Concern Present (06/14/2023)  Tobacco Use: Medium Risk (10/12/2023)    Readmission Risk Interventions     No data to display         Quintella Baton, RN, BSN  Trauma/Neuro ICU Case Manager [phone]

## 2023-10-17 NOTE — Plan of Care (Signed)
  Problem: Education: Goal: Understanding of CV disease, CV risk reduction, and recovery process will improve Outcome: Progressing Goal: Individualized Educational Video(s) Outcome: Progressing   Problem: Activity: Goal: Ability to return to baseline activity level will improve Outcome: Progressing   Problem: Cardiovascular: Goal: Ability to achieve and maintain adequate cardiovascular perfusion will improve Outcome: Progressing Goal: Vascular access site(s) Level 0-1 will be maintained Outcome: Progressing   Problem: Health Behavior/Discharge Planning: Goal: Ability to safely manage health-related needs after discharge will improve Outcome: Progressing   Problem: Education: Goal: Knowledge of General Education information will improve Description: Including pain rating scale, medication(s)/side effects and non-pharmacologic comfort measures Outcome: Progressing   Problem: Health Behavior/Discharge Planning: Goal: Ability to manage health-related needs will improve Outcome: Progressing   Problem: Clinical Measurements: Goal: Ability to maintain clinical measurements within normal limits will improve Outcome: Progressing Goal: Will remain free from infection Outcome: Progressing Goal: Diagnostic test results will improve Outcome: Progressing Goal: Respiratory complications will improve Outcome: Progressing Goal: Cardiovascular complication will be avoided Outcome: Progressing   Problem: Activity: Goal: Risk for activity intolerance will decrease Outcome: Progressing   Problem: Nutrition: Goal: Adequate nutrition will be maintained Outcome: Progressing   Problem: Coping: Goal: Level of anxiety will decrease Outcome: Progressing   Problem: Elimination: Goal: Will not experience complications related to bowel motility Outcome: Progressing Goal: Will not experience complications related to urinary retention Outcome: Progressing   Problem: Pain Management: Goal:  General experience of comfort will improve Outcome: Progressing   Problem: Safety: Goal: Ability to remain free from injury will improve Outcome: Progressing   Problem: Skin Integrity: Goal: Risk for impaired skin integrity will decrease Outcome: Progressing   Problem: Education: Goal: Will demonstrate proper wound care and an understanding of methods to prevent future damage Outcome: Progressing Goal: Knowledge of disease or condition will improve Outcome: Progressing Goal: Knowledge of the prescribed therapeutic regimen will improve Outcome: Progressing Goal: Individualized Educational Video(s) Outcome: Progressing   Problem: Activity: Goal: Risk for activity intolerance will decrease Outcome: Progressing   Problem: Cardiac: Goal: Will achieve and/or maintain hemodynamic stability Outcome: Progressing   Problem: Clinical Measurements: Goal: Postoperative complications will be avoided or minimized Outcome: Progressing   Problem: Respiratory: Goal: Respiratory status will improve Outcome: Progressing   Problem: Skin Integrity: Goal: Wound healing without signs and symptoms of infection Outcome: Progressing Goal: Risk for impaired skin integrity will decrease Outcome: Progressing   Problem: Urinary Elimination: Goal: Ability to achieve and maintain adequate renal perfusion and functioning will improve Outcome: Progressing   Problem: Activity: Goal: Ability to tolerate increased activity will improve Outcome: Progressing   Problem: Respiratory: Goal: Ability to maintain a clear airway and adequate ventilation will improve Outcome: Progressing   Problem: Role Relationship: Goal: Method of communication will improve Outcome: Progressing

## 2023-10-17 NOTE — Progress Notes (Signed)
Mobility Specialist Progress Note:   10/17/23 1555  Mobility  Activity Ambulated with assistance in hallway  Level of Assistance Standby assist, set-up cues, supervision of patient - no hands on  Assistive Device Four wheel walker  Distance Ambulated (ft) 470 ft  RUE Weight Bearing NWB  LUE Weight Bearing NWB  Activity Response Tolerated well  Mobility Referral Yes  $Mobility charge 1 Mobility  Mobility Specialist Start Time (ACUTE ONLY) 1515  Mobility Specialist Stop Time (ACUTE ONLY) 1530  Mobility Specialist Time Calculation (min) (ACUTE ONLY) 15 min   Pre Mobility: 94 HR  During Mobility: 117 HR   Pt received in chair, agreeable to mobility. Stated he feels better compared to this morning. Ambulated with rollator. Educated pt on how to use roll. HR stable during ambulation with peak of 117 bpm. Asx throughout. Pt returned to chair with call bell in reach and all needs met.  Leory Plowman  Mobility Specialist Please contact via [employer] office at [phone]

## 2023-10-17 NOTE — Progress Notes (Addendum)
Mobility Specialist Progress Note:   10/17/23 1046  Mobility  Activity Ambulated with assistance in hallway  Level of Assistance Standby assist, set-up cues, supervision of patient - no hands on  Assistive Device Front wheel walker  Distance Ambulated (ft) 340 ft  RUE Weight Bearing NWB  LUE Weight Bearing NWB  Activity Response Tolerated well  Mobility Referral Yes  $Mobility charge 1 Mobility  Mobility Specialist Start Time (ACUTE ONLY) 0910  Mobility Specialist Stop Time (ACUTE ONLY) [id number]  Mobility Specialist Time Calculation (min) (ACUTE ONLY) 13 min   Pre Mobility: 93 HR , 97% SpO2 During Mobility: 141 HR   Pt received in chair, agreeable to mobility. Prior to ambulation pt requesting to use BR. Void successful. Elevated HR during ambulation with peak of 141 bpm. RN notified. Pt asymptomatic throughout with c/o fatigue d/t lack of sleep. Pt returned to chair with call bell in reach and all needs met.  Leory Plowman  Mobility Specialist Please contact via [employer] office at [phone]

## 2023-10-17 NOTE — Progress Notes (Addendum)
Pt just ambulated with MT and in afib rate 110-120s in recliner.   Discussed with pt IS, sternal precautions, exercise, and CRPII. Pt receptive with appropriate questions. Gave heart healthy diet sheets. Will refer to [facility]. Encouraged more walking today. Pt would like a rollator for home use.  [phone] Ethelda Chick BS, ACSM-CEP 10/17/2023 9:42 AM

## 2023-10-17 NOTE — Progress Notes (Signed)
5 Days Post-Op Procedure(s) (LRB): CORONARY ARTERY BYPASS GRAFTING (CABG) X THREE, USING LEFT INTERNAL MAMMARY ARTERY AND RIGHT GREATER SAPHENEOUS VEIN HARVESTED ENDOSCOPICLY (N/A) TRANSESOPHAGEAL ECHOCARDIOGRAM (N/A) Subjective: Feels better  Objective: Vital signs in last 24 hours: Temp:  [98.1 F (36.7 C)-98.4 F (36.9 C)] 98.4 F (36.9 C) (11/12 0436) Pulse Rate:  [78-99] 81 (11/12 0436) Cardiac Rhythm: Normal sinus rhythm;Bundle branch block (11/11 2000) Resp:  [15-25] 15 (11/12 0436) BP: (142-167)/(74-92) 145/82 (11/12 0436) SpO2:  [93 %-98 %] 97 % (11/12 0436) Weight:  [87.3 kg] 87.3 kg (11/12 0608)  Hemodynamic parameters for last 24 hours:    Intake/Output from previous day: 11/11 0701 - 11/12 0700 In: 4.5 [I.V.:4.5] Out: -  Intake/Output this shift: No intake/output data recorded.  General appearance: alert, cooperative, and no distress Heart: regular rate and rhythm and tachy Lungs: mildly dim in bases Abdomen: benign Extremities: no edema Wound: incis healing well  Lab Results: Recent Labs    10/15/23 0336  WBC 8.6  HGB 10.2*  HCT 30.9*  PLT 176   BMET:  Recent Labs    10/15/23 0336 10/16/23 0323  NA 137 138  K 4.0 4.1  CL 105 103  CO2 22 25  GLUCOSE 101* 96  BUN 22 22  CREATININE 1.03 0.99  CALCIUM 8.1* 8.2*    PT/INR: No results for input(s): "LABPROT", "INR" in the last 72 hours. ABG    Component Value Date/Time   PHART 7.332 (L) 10/12/2023 1918   HCO3 20.9 10/12/2023 1918   TCO2 22 10/12/2023 1918   ACIDBASEDEF 5.0 (H) 10/12/2023 1918   O2SAT 87 10/12/2023 1918   CBG (last 3)  Recent Labs    10/16/23 2013 10/16/23 2359 10/17/23 0434  GLUCAP 106* 98 97    Meds Scheduled Meds:  acetaminophen  1,000 mg Oral Q6H   Or   acetaminophen (TYLENOL) oral liquid 160 mg/5 mL  1,000 mg Per Tube Q6H   amiodarone  400 mg Oral BID   aspirin EC  325 mg Oral Daily   Or   aspirin  324 mg Per Tube Daily   atorvastatin  80 mg Oral Daily    bisacodyl  10 mg Oral Daily   Or   bisacodyl  10 mg Rectal Daily   Chlorhexidine Gluconate Cloth  6 each Topical Daily   docusate  200 mg Per Tube Daily   Or   docusate sodium  200 mg Oral Daily   enoxaparin (LOVENOX) injection  40 mg Subcutaneous QHS   furosemide  40 mg Oral Daily   insulin aspart  0-24 Units Subcutaneous Q4H   metoprolol tartrate  12.5 mg Oral BID   pantoprazole  40 mg Oral Daily   potassium chloride  40 mEq Oral Daily   sodium chloride flush  3 mL Intravenous Q12H   Continuous Infusions:  albumin human Stopped (10/12/23 1710)   PRN Meds:.albumin human, metoprolol tartrate, midazolam, morphine injection, ondansetron (ZOFRAN) IV, mouth rinse, oxyCODONE, sodium chloride flush, traMADol  Xrays No results found.  Assessment/Plan: S/P Procedure(s) (LRB): CORONARY ARTERY BYPASS GRAFTING (CABG) X THREE, USING LEFT INTERNAL MAMMARY ARTERY AND RIGHT GREATER SAPHENEOUS VEIN HARVESTED ENDOSCOPICLY (N/A) TRANSESOPHAGEAL ECHOCARDIOGRAM (N/A) POD#5  1 afeb, BP 140's-160's- losartan added, will also resume norvasc. Sinus rhythm with some sinus tachy and some rare afib- cont amio/will increase beta blocker a little- may need to consider ACRX 2 O2 sats good on RA 3 weight below preop- will stop lasix 4 BS well controlled 5 no  new labs or xrays 6 cont rehab and pulm hygiene 7 lovenox for DVT ppx    LOS: 7 days    Rowe Clack PA-C Pager [phone] 10/17/2023

## 2023-10-17 NOTE — Plan of Care (Signed)
  Problem: Education: Goal: Understanding of CV disease, CV risk reduction, and recovery process will improve Outcome: Progressing Goal: Individualized Educational Video(s) Outcome: Progressing   Problem: Activity: Goal: Ability to return to baseline activity level will improve Outcome: Progressing   

## 2023-10-18 LAB — GLUCOSE, CAPILLARY: Glucose-Capillary: 88 mg/dL (ref 70–99)

## 2023-10-18 MED ORDER — ATORVASTATIN CALCIUM 80 MG PO TABS: 80.0000 mg | ORAL_TABLET | Freq: Every day | ORAL | 1 refills | Status: DC

## 2023-10-18 MED ORDER — AMIODARONE HCL 400 MG PO TABS: 400.0000 mg | ORAL_TABLET | Freq: Two times a day (BID) | ORAL | 1 refills | Status: DC

## 2023-10-18 MED ORDER — OXYCODONE HCL 5 MG PO TABS: 5.0000 mg | ORAL_TABLET | Freq: Four times a day (QID) | ORAL | 0 refills | Status: DC | PRN

## 2023-10-18 MED ORDER — ASPIRIN 325 MG PO TBEC: 325.0000 mg | DELAYED_RELEASE_TABLET | Freq: Every day | ORAL | Status: AC

## 2023-10-18 MED ORDER — LOSARTAN POTASSIUM 25 MG PO TABS: 25.0000 mg | ORAL_TABLET | Freq: Every day | ORAL | 1 refills | Status: DC

## 2023-10-18 NOTE — Progress Notes (Signed)
Discharge instructions (including medications) discussed with and copy provided to patient/caregiver 

## 2023-10-18 NOTE — Progress Notes (Signed)
Mobility Specialist Progress Note:   10/18/23 0953  Mobility  Activity Ambulated with assistance in hallway  Level of Assistance Standby assist, set-up cues, supervision of patient - no hands on  Assistive Device Four wheel walker  Distance Ambulated (ft) 470 ft  RUE Weight Bearing NWB  LUE Weight Bearing NWB  Activity Response Tolerated well  Mobility Referral Yes  $Mobility charge 1 Mobility  Mobility Specialist Start Time (ACUTE ONLY) [id number]  Mobility Specialist Stop Time (ACUTE ONLY) 0945  Mobility Specialist Time Calculation (min) (ACUTE ONLY) 7 min   During Mobility: 99 HR  Post Mobility: 75 HR   Pt received in bed, agreeable to mobility. Denied any discomfort during ambulation. Asx throughout. VSS. Pt returned to bed with all needs met.    Leory Plowman  Mobility Specialist Please contact via [employer] office at [phone]

## 2023-10-18 NOTE — Progress Notes (Signed)
6 Days Post-Op Procedure(s) (LRB): CORONARY ARTERY BYPASS GRAFTING (CABG) X THREE, USING LEFT INTERNAL MAMMARY ARTERY AND RIGHT GREATER SAPHENEOUS VEIN HARVESTED ENDOSCOPICLY (N/A) TRANSESOPHAGEAL ECHOCARDIOGRAM (N/A) Subjective: Feels well  Objective: Vital signs in last 24 hours: Temp:  [97.9 F (36.6 C)-98.4 F (36.9 C)] 98.1 F (36.7 C) (11/13 0518) Pulse Rate:  [72-113] 72 (11/12 2359) Cardiac Rhythm: Atrial fibrillation (11/12 2200) Resp:  [14-19] 17 (11/12 2359) BP: (121-152)/(70-82) 126/75 (11/12 2359) SpO2:  [92 %-97 %] 94 % (11/12 2359) Weight:  [86.5 kg] 86.5 kg (11/13 0518)  Hemodynamic parameters for last 24 hours:    Intake/Output from previous day: 11/12 0701 - 11/13 0700 In: 240 [P.O.:240] Out: -  Intake/Output this shift: No intake/output data recorded.  General appearance: alert, cooperative, and no distress Heart: regular rate and rhythm Lungs: mildly dim in bases Abdomen: benign Extremities: no edema Wound: incis healing well  Lab Results: No results for input(s): "WBC", "HGB", "HCT", "PLT" in the last 72 hours. BMET:  Recent Labs    10/16/23 0323  NA 138  K 4.1  CL 103  CO2 25  GLUCOSE 96  BUN 22  CREATININE 0.99  CALCIUM 8.2*    PT/INR: No results for input(s): "LABPROT", "INR" in the last 72 hours. ABG    Component Value Date/Time   PHART 7.332 (L) 10/12/2023 1918   HCO3 20.9 10/12/2023 1918   TCO2 22 10/12/2023 1918   ACIDBASEDEF 5.0 (H) 10/12/2023 1918   O2SAT 87 10/12/2023 1918   CBG (last 3)  Recent Labs    10/17/23 1718 10/17/23 1917 10/18/23 0601  GLUCAP 104* 125* 88    Meds Scheduled Meds:  amiodarone  400 mg Oral BID   amLODipine  10 mg Oral Daily   aspirin EC  325 mg Oral Daily   Or   aspirin  324 mg Per Tube Daily   atorvastatin  80 mg Oral Daily   bisacodyl  10 mg Oral Daily   Or   bisacodyl  10 mg Rectal Daily   Chlorhexidine Gluconate Cloth  6 each Topical Daily   docusate  200 mg Per Tube Daily    Or   docusate sodium  200 mg Oral Daily   enoxaparin (LOVENOX) injection  40 mg Subcutaneous QHS   insulin aspart  0-24 Units Subcutaneous TID AC & HS   losartan  25 mg Oral Daily   metoprolol tartrate  25 mg Oral BID   pantoprazole  40 mg Oral Daily   sodium chloride flush  3 mL Intravenous Q12H   Continuous Infusions:  albumin human Stopped (10/12/23 1710)   PRN Meds:.albumin human, metoprolol tartrate, midazolam, morphine injection, ondansetron (ZOFRAN) IV, mouth rinse, oxyCODONE, sodium chloride flush, traMADol  Xrays No results found.  Assessment/Plan: S/P Procedure(s) (LRB): CORONARY ARTERY BYPASS GRAFTING (CABG) X THREE, USING LEFT INTERNAL MAMMARY ARTERY AND RIGHT GREATER SAPHENEOUS VEIN HARVESTED ENDOSCOPICLY (N/A) TRANSESOPHAGEAL ECHOCARDIOGRAM (N/A) POD#6  1 afeb, BP control is much better, sinus w/ PVC's, BBB- stable 2 sats good on RA 3 BS well controlled 4 no new labs or xrays 5 stable for D/C    LOS: 8 days    Rowe Clack PA-C Pager [phone] 10/18/2023

## 2023-10-18 NOTE — Plan of Care (Signed)
  Problem: Clinical Measurements: Goal: Will remain free from infection Outcome: Progressing Goal: Respiratory complications will improve Outcome: Progressing Goal: Cardiovascular complication will be avoided Outcome: Progressing   

## 2023-10-18 NOTE — TOC Transition Note (Signed)
Transition of Care (TOC) - CM/SW Discharge Note Donn Pierini RN, BSN Transitions of Care Unit 4E- RN Case Manager See Treatment Team for direct phone #   Patient Details  Name: Leroy Proctor MRN: [id number] Date of Birth: 06-Apr-1951  Transition of Care [facility]) CM/SW Contact:  Darrold Span, RN Phone Number: 10/18/2023, 12:51 PM   Clinical Narrative:    Pt stable for transition home today, family to transport home.  DME- rollator has been arranged and delivered to room per [employer].   No further TOC needs noted.    Final next level of care: Home/Self Care Barriers to Discharge: No Barriers Identified   Patient Goals and CMS Choice CMS Medicare.gov Compare Post Acute Care list provided to:: Patient Choice offered to / list presented to : Patient  Discharge Placement               Home          Discharge Plan and Services Additional resources added to the After Visit Summary for     Discharge Planning Services: CM Consult Post Acute Care Choice: Durable Medical Equipment          DME Arranged: Walker rolling with seat DME Agency: [employer] Date DME Agency Contacted: 10/17/23 Time DME Agency Contacted: 1020 Representative spoke with at DME Agency: Mickeal Needy HH Arranged: NA HH Agency: NA        Social Determinants of Health (SDOH) Interventions SDOH Screenings   Food Insecurity: No Food Insecurity (10/10/2023)  Housing: Low Risk  (10/10/2023)  Transportation Needs: No Transportation Needs (10/10/2023)  Utilities: Not At Risk (10/10/2023)  Alcohol Screen: Low Risk  (06/14/2023)  Depression (PHQ2-9): Low Risk  (05/11/2023)  Financial Resource Strain: Low Risk  (05/08/2023)  Physical Activity: Sufficiently Active (05/08/2023)  Social Connections: Socially Isolated (06/14/2023)  Stress: No Stress Concern Present (06/14/2023)  Tobacco Use: Medium Risk (10/12/2023)     Readmission Risk Interventions     No data to display

## 2023-10-19 ENCOUNTER — Telehealth: Payer: Self-pay

## 2023-10-19 NOTE — Transitions of Care (Post Inpatient/ED Visit) (Signed)
   10/19/2023  Name: Leroy Proctor MRN: [id number] DOB: 04-14-1951  Today's TOC FU Call Status: Today's TOC FU Call Status:: Unsuccessful Call (1st Attempt) Unsuccessful Call (1st Attempt) Date: 10/19/23  Attempted to reach the patient regarding the most recent Inpatient/ED visit.  Patient answered the phone but was unable to continue call for full follow up due to unable to find hearing aids since discharge. Son in law has patient belong bad given at discharge. He will have spouse contact son in law to see if hearing aids present. Patient requested a call back tomorrow 10/20/23.  Follow Up Plan: Additional outreach attempts will be made to reach the patient to complete the Transitions of Care (Post Inpatient/ED visit) call. Patient requested a call back tomorrow 10/20/23.   Gabriel Cirri MSN, RN [place]  [facility] Management Coordinator [email or web address] Direct Dial: [phone] Fax: [phone]

## 2023-10-20 ENCOUNTER — Telehealth: Payer: Self-pay | Admitting: *Deleted

## 2023-10-20 ENCOUNTER — Ambulatory Visit (INDEPENDENT_AMBULATORY_CARE_PROVIDER_SITE_OTHER): Payer: Self-pay | Admitting: Thoracic Surgery (Cardiothoracic Vascular Surgery)

## 2023-10-20 ENCOUNTER — Telehealth (HOSPITAL_COMMUNITY): Payer: Self-pay

## 2023-10-20 DIAGNOSIS — Z951 Presence of aortocoronary bypass graft: Secondary | ICD-10-CM

## 2023-10-20 NOTE — Progress Notes (Signed)
     [address]       [place] [address]             [phone]       Patient: Home Provider: Office Consent for Telemedicine visit obtained.  Today's visit was completed via a real-time telehealth (see specific modality noted below). The patient/authorized person provided oral consent at the time of the visit to engage in a telemedicine encounter with the present provider at [facility]. The patient/authorized person was informed of the potential benefits, limitations, and risks of telemedicine. The patient/authorized person expressed understanding that the laws that protect confidentiality also apply to telemedicine. The patient/authorized person acknowledged understanding that telemedicine does not provide emergency services and that he or she would need to call 911 or proceed to the nearest hospital for help if such a need arose.   Total time spent in the clinical discussion 10 minutes.  Telehealth Modality: Phone visit (audio only)  I had a telephone visit with  [name] who is s/p CABG.  Overall doing well.  Pain is minimal.  Ambulating well. Vitals have been stable.  [name] will see [place] back in 1 month with a chest x-ray for cardiac rehab clearance.  Kloi Brodman Keane Scrape

## 2023-10-20 NOTE — Transitions of Care (Post Inpatient/ED Visit) (Signed)
10/20/2023  Name: Leroy Proctor MRN: [id number] DOB: August 12, 1951  Today's TOC FU Call Status: Today's TOC FU Call Status:: Successful TOC FU Call Completed TOC FU Call Complete Date: 10/20/23 Patient's Name and Date of Birth confirmed.  Transition Care Management Follow-up Telephone Call Date of Discharge: 10/18/23 Discharge Facility: [facility]) Type of Discharge: Inpatient Admission Primary Inpatient Discharge Diagnosis:: angina/ planned surgical CABG x 3 How have you been since you were released from the hospital?: Better ("I am doing fine; I hired a Engineer, maintenance to come in and help out while I am recuperating.  She went over all of my medications today.  I have an appointment with the surgeon this afternoon,; all is going just fine and as planned") Any questions or concerns?: No  Items Reviewed: Did you receive and understand the discharge instructions provided?: Yes (thoroughly reviewed with patient who verbalizes good understanding of same) Medications obtained,verified, and reconciled?: Partial Review Completed (Partial medication reconciliation/ review completed;  confirmed patient obtained/ is taking all newly Rx'd medications as instructed; self-manages medications and denies questions/ concerns around medications today) Reason for Partial Mediation Review: Patient declined- reports his private duty nurse already reviewed today and hen is not near his medications: confirmed he obtained and is taking alll newly prescribed medications as instructed Any new allergies since your discharge?: No Dietary orders reviewed?: Yes Type of Diet Ordered:: Heart Healthy, low salt Do you have support at home?: Yes People in Home: spouse Name of Support/Comfort Primary Source: Reports independent in self-care activities; supportive spouse assists as/ if needed/ indicated  Medications Reviewed Today: Medications Reviewed Today     Reviewed by Michaela Corner, RN (Registered Nurse) on  10/20/23 at 1456  Med List Status: <None>   Medication Order Taking? Sig Documenting Provider Last Dose Status Informant  amiodarone (PACERONE) 400 MG tablet [id number] Yes Take 1 tablet (400 mg total) by mouth 2 (two) times daily. For 5 days then 400 mg once daily Gold, Wayne E, PA-C Taking Active   amLODipine (NORVASC) 10 MG tablet [id number]  Take 1 tablet (10 mg total) by mouth daily. Saguier, Kateri Mc  Active Self  aspirin EC 325 MG tablet [id number]  Take 1 tablet (325 mg total) by mouth daily. Rowe Clack, PA-C  Active   atorvastatin (LIPITOR) 80 MG tablet [id number]  Take 1 tablet (80 mg total) by mouth daily. Rowe Clack, PA-C  Active   losartan (COZAAR) 25 MG tablet [id number] Yes Take 1 tablet (25 mg total) by mouth daily. Rowe Clack, PA-C Taking Active   metoprolol tartrate (LOPRESSOR) 25 MG tablet [id number]  Take 1 tablet (25 mg total) by mouth 2 (two) times daily. MadireddyMarlyn Corporal, MD  Active Self  omeprazole (PRILOSEC) 20 MG capsule [id number]  TAKE 1 CAPSULE BY MOUTH EVERY MORNING 30 MINUTES BEFORE BREAKFAST Saguier, Ramon Dredge, [place]  Active Self  oxyCODONE (OXY IR/ROXICODONE) 5 MG immediate release tablet [id number] Yes Take 1 tablet (5 mg total) by mouth every 6 (six) hours as needed for severe pain (pain score 7-10). Rowe Clack, PA-C Taking Active             Home Care and Equipment/Supplies: Were Home Health Services Ordered?: No Any new equipment or medical supplies ordered?: Yes (Rollator walker) Name of Medical supply agency?: [employer] Were you able to get the equipment/medical supplies?: Yes (reports using rollator prn) Do you have any questions related to the use of the equipment/supplies?: No  Functional Questionnaire: Do  you need assistance with bathing/showering or dressing?: No Do you need assistance with meal preparation?: No Do you need assistance with eating?: No Do you have difficulty maintaining continence: No Do you need assistance with getting  out of bed/getting out of a chair/moving?: No Do you have difficulty managing or taking your medications?: Yes (self- manages medications at baseline- reports has hired Engineer, drilling after recent surgery who is also assisting with medication management during post-op period)  Follow up appointments reviewed: PCP Follow-up appointment confirmed?: NA (verified not indicated per hospital discharging provider discharge notes) Specialist Hospital Follow-up appointment confirmed?: Yes Date of Specialist follow-up appointment?: 10/20/23 Follow-Up Specialty Provider:: [facility]- cardiothoracic surgeon- Dr. Cliffton Asters Do you need transportation to your follow-up appointment?: No Do you understand care options if your condition(s) worsen?: Yes-patient verbalized understanding  SDOH Interventions Today    Flowsheet Row Most Recent Value  SDOH Interventions   Food Insecurity Interventions Intervention Not Indicated  Housing Interventions Intervention Not Indicated  Transportation Interventions Intervention Not Indicated  [drives self at baseline,  family and/ or private duty nurse assisting with transportation needs during post-op period]  Utilities Interventions Intervention Not Indicated      Additional TOC interventions: Confirmed currently requiring/ using assistive devices prn/ independent in self-care activities Reinforced/ provided education around basics of following heart healthy low salt diet Patient declines need for ongoing/ further care management/ coordination outreach; declines enrollment in 30-day TOC program- declines taking my direct phone number should needs/ concerns arise post-TOC call   Caryl Pina, RN, BSN, CCRN Alumnus RN Care Manager  Transitions of Care  [facility] - Population Health  [place] [phone]: direct office

## 2023-10-20 NOTE — Telephone Encounter (Signed)
Per phase 1 Cardiac Rehab fax referral to [facility]

## 2023-10-24 ENCOUNTER — Ambulatory Visit: Payer: Medicare PPO | Admitting: Cardiology

## 2023-10-25 ENCOUNTER — Encounter: Payer: Self-pay | Admitting: Internal Medicine

## 2023-10-25 MED FILL — Sodium Chloride IV Soln 0.9%: INTRAVENOUS | Qty: 2000 | Status: AC

## 2023-10-25 MED FILL — Electrolyte-R (PH 7.4) Solution: INTRAVENOUS | Qty: 4000 | Status: AC

## 2023-10-25 MED FILL — Sodium Bicarbonate IV Soln 8.4%: INTRAVENOUS | Qty: 50 | Status: AC

## 2023-10-25 MED FILL — Calcium Chloride Inj 10%: INTRAVENOUS | Qty: 10 | Status: AC

## 2023-10-30 ENCOUNTER — Ambulatory Visit: Payer: Medicare PPO

## 2023-10-30 VITALS — BP 124/56 | HR 54 | Ht 68.0 in | Wt 189.0 lb

## 2023-10-30 DIAGNOSIS — I4891 Unspecified atrial fibrillation: Secondary | ICD-10-CM

## 2023-10-30 DIAGNOSIS — I25118 Atherosclerotic heart disease of native coronary artery with other forms of angina pectoris: Secondary | ICD-10-CM | POA: Diagnosis not present

## 2023-10-30 DIAGNOSIS — R918 Other nonspecific abnormal finding of lung field: Secondary | ICD-10-CM | POA: Diagnosis not present

## 2023-10-30 DIAGNOSIS — I340 Nonrheumatic mitral (valve) insufficiency: Secondary | ICD-10-CM | POA: Diagnosis not present

## 2023-10-30 DIAGNOSIS — I10 Essential (primary) hypertension: Secondary | ICD-10-CM | POA: Diagnosis not present

## 2023-10-30 DIAGNOSIS — I480 Paroxysmal atrial fibrillation: Secondary | ICD-10-CM | POA: Diagnosis not present

## 2023-10-30 HISTORY — DX: Other nonspecific abnormal finding of lung field: R91.8

## 2023-10-30 HISTORY — DX: Unspecified atrial fibrillation: I48.91

## 2023-10-30 HISTORY — DX: Nonrheumatic mitral (valve) insufficiency: I34.0

## 2023-10-30 NOTE — Assessment & Plan Note (Signed)
Small. Given history of smoking would recommend follow-up imaging with CT chest tentatively in 1 year. Will forward to PCP.

## 2023-10-30 NOTE — Assessment & Plan Note (Signed)
Well-controlled. Continue losartan 25 mg once daily in addition to metoprolol and amlodipine as below.  Target below 130 over 80 mmHg.

## 2023-10-30 NOTE — Assessment & Plan Note (Signed)
Recovering well since his surgery.  Pending follow-up with CT surgeon next month and anticipated to start rehab after that.  Advised him to avoid any significant weight lifting and driving until further clearance from the surgeon.  Advised to continue aspirin 81 mg once daily Continue atorvastatin 80 mg once daily  Continue with metoprolol tartrate 25 mg twice daily Amlodipine 10 mg once daily to be continued

## 2023-10-30 NOTE — Patient Instructions (Signed)
Medication Instructions:    Your physician recommends that you continue on your current medications as directed. Please refer to the Current Medication list given to you today.  *If you need a refill on your cardiac medications before your next appointment, please call your pharmacy*   Lab Work: None ordered  If you have labs (blood work) drawn today and your tests are completely normal, you will receive your results only by: MyChart Message (if you have MyChart) OR A paper copy in the mail If you have any lab test that is abnormal or we need to change your treatment, we will call you to review the results.   Testing/Procedures: None ordered    Follow-Up: At [facility], you and your health needs are our priority.  As part of our continuing mission to provide you with exceptional heart care, we have created designated Provider Care Teams.  These Care Teams include your primary Cardiologist (physician) and Advanced Practice Providers (APPs -  Physician Assistants and Nurse Practitioners) who all work together to provide you with the care you need, when you need it.  We recommend signing up for the patient portal called "MyChart".  Sign up information is provided on this After Visit Summary.  MyChart is used to connect with patients for Virtual Visits (Telemedicine).  Patients are able to view lab/test results, encounter notes, upcoming appointments, etc.  Non-urgent messages can be sent to your provider as well.   To learn more about what you can do with MyChart, go to [email or web address].    Your next appointment:   3 month(s)  Provider:   Huntley Dec, MD

## 2023-10-30 NOTE — Assessment & Plan Note (Signed)
Postop A-fib. Remains in sinus rhythm today. Continue amiodarone tentatively for 3 months. Lower the dose to 200 mg once daily after 1 month He is pending follow-up with CT surgeon next month, can be discontinued earlier based on surgeons recommendations.  Given results stop afebrile and no further recurrence, will off on anticoagulation.  If he does have any recurrence of A-fib, we will recommend anticoagulation for stroke prophylaxis.

## 2023-10-30 NOTE — Progress Notes (Signed)
Cardiology Consultation:    Date:  10/30/2023   ID:  [name], DOB [birth year], MRN [id number]  PCP:  Esperanza Richters, PA-C  Cardiologist:  Marlyn Corporal Kathey Simer, MD   Referring MD: Marisue Brooklyn   No chief complaint on file.    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   Problem List Items Addressed This Visit     HTN (hypertension)    Well-controlled. Continue losartan 25 mg once daily in addition to metoprolol and amlodipine as below.  Target below 130 over 80 mmHg.       CAD calcium score 1624, CAD RADS 4 study on CT with abnormal FFR, cardiac cath 10/10/2023, CABG 10/12/2023 [LIMA-LAD, SVG-OM, SVG-PDA]. - Primary    Recovering well since his surgery.  Pending follow-up with CT surgeon next month and anticipated to start rehab after that.  Advised him to avoid any significant weight lifting and driving until further clearance from the surgeon.  Advised to continue aspirin 81 mg once daily Continue atorvastatin 80 mg once daily  Continue with metoprolol tartrate 25 mg twice daily Amlodipine 10 mg once daily to be continued       Relevant Orders   EKG 12-Lead (Completed)   Postop atrial fibrillation after CABG 10/12/2023, started on amiodarone.  Currently sinus rhythm    Postop A-fib. Remains in sinus rhythm today. Continue amiodarone tentatively for 3 months. Lower the dose to 200 mg  once daily after 1 month He is pending follow-up with CT surgeon next month, can be discontinued earlier based on surgeons recommendations.  Given results stop afebrile and no further recurrence, will off on anticoagulation.  If he does have any recurrence of A-fib, we will recommend anticoagulation for stroke prophylaxis.      Mild mitral regurgitation noted on IntraOp TEE 10/12/2023    Will follow-up with interval echocardiogram tentatively in 1 year.       Pulmonary nodules, small 3 mm on CT coronary imaging 09/2023; history of smoking, recommend 83-month follow-up    Small. Given history of smoking would recommend follow-up imaging with CT chest tentatively in 1 year. Will forward to PCP.      Return to clinic in 3 months.   History of Present Illness:    Leroy Proctor is a 72 y.o. male who is being seen today for follow-up visit.  Last visit with me in the office was October 05, 2023. Here for follow-up visit after his diagnosis of triple-vessel disease on coronary angiogram and underwent CABG with Dr. Cliffton Asters on 10/12/2023 PCP is Saguier, Ramon Dredge, PA-C.  History of CAD diagnosed on workup for chest discomfort with a CT coronary angiogram noted calcium score elevated with CAD RADS 4 disease with severe LAD disease, scheduled for cardiac catheterization 10/10/2023 and subsequently underwent CABG [three-vessel on 10/12/2023 LIMA-LAD, SVG-OM, SVG-PDA] developed postop atrial fibrillation on day 2 reverted to sinus rhythm at discharge.  IntraOp TEE 10/12/2023 reported LVEF 60 to 65%, moderate concentric left ventricular hypertrophy.  Mild mild regurgitation, trace  TR.  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  Here for the visit accompanied by his wife. Since surgery the wound healing has been happening well.  He has been  able to sleep slightly better but still disturb due to the discomfort from laying on the back.  Denies any other chest pain or shortness of breath.  They are walking in and around the house without significant discomfort.  Cardiac rehab is scheduled to begin next month.  Follow-up with surgeon is pending next month.  Occasionally notes mild swelling in the right lower extremity from which SVG grafts were harvested, especially after walking a certain distance.  Relieved with rest.  Denies any significant palpitations, lightheadedness or dizziness.  EKG in the clinic today shows sinus rhythm heart rate 54/min, prolonged PR interval 272 ms, wide QRS 150 ms consistent with LBBB.  Good compliance with all his medications including aspirin, atorvastatin, losartan, amlodipine, metoprolol, amiodarone.  He is using Tylenol as needed for pain.  Past Medical History:  Diagnosis Date   ABLA (acute blood loss anemia) 10/12/2023   Allergy    Arthritis    Bradycardia 10/02/2013   CAD, triple-vessel disease with calcium score 1624, CAD RADS 4 study, severe stenosis in LAD, moderate stenosis in LCx and RCA, hemodynamically significant by CT FFR in all 3 territories 10/05/2023   Cataract    Chest pain of uncertain etiology 09/19/2023   Chronic renal insufficiency, stage II (mild)    CrCl 60s   Cold sore 06/03/2014   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   Endotracheally intubated 10/12/2023   Erectile dysfunction    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   GERD (gastroesophageal reflux disease)    Health maintenance examination 10/16/2014   Herpes zoster    R side of face   HTN (hypertension) 11/21/2011   Hyperlipidemia    Impaired fasting glucose 2013-2015   Kidney stones    Left bundle branch block 09/19/2023   Personal history of kidney stones    Prediabetes 10/11/2023   Primary hypercholesterolemia 10/11/2023   Progressive angina (HCC) 10/10/2023    Recurrent herpes labialis    Routine general medical examination at a health care facility 10/02/2013   S/P CABG x 3 10/12/2023    Past Surgical History:  Procedure Laterality Date   COLONOSCOPY  2002; 2014   Repeat 2024   CORONARY ARTERY BYPASS GRAFT N/A 10/12/2023   Procedure: CORONARY ARTERY BYPASS GRAFTING (CABG) X THREE, USING LEFT INTERNAL MAMMARY ARTERY AND RIGHT GREATER SAPHENEOUS VEIN HARVESTED ENDOSCOPICLY;  Surgeon: Corliss Skains, MD;  Location: [facility] OR;  Service: Open Heart Surgery;  Laterality: N/A;   LEFT HEART CATH AND CORONARY ANGIOGRAPHY N/A 10/10/2023   Procedure: LEFT HEART CATH AND CORONARY ANGIOGRAPHY;  Surgeon: Marykay Lex, MD;  Location: [facility] INVASIVE CV LAB;  Service: Cardiovascular;  Laterality: N/A;   LITHOTRIPSY     TEE WITHOUT CARDIOVERSION N/A 10/12/2023   Procedure: TRANSESOPHAGEAL ECHOCARDIOGRAM;  Surgeon: Corliss Skains, MD;  Location: [facility] OR;  Service: Open Heart Surgery;  Laterality: N/A;    Current Medications: Current Meds  Medication Sig   amiodarone (PACERONE) 400 MG tablet Take 1 tablet (400 mg total) by mouth 2 (two) times daily. For 5 days then 400 mg once daily   amLODipine (NORVASC) 10 MG tablet Take 1 tablet (10 mg total) by  mouth daily.   aspirin EC 325 MG tablet Take 1 tablet (325 mg total) by mouth daily.   atorvastatin (LIPITOR) 80 MG tablet Take 1 tablet (80 mg total) by mouth daily.   losartan (COZAAR) 25 MG tablet Take 1 tablet (25 mg total) by mouth daily.   metoprolol tartrate (LOPRESSOR) 25 MG tablet Take 1 tablet (25 mg total) by mouth 2 (two) times daily.   omeprazole (PRILOSEC) 20 MG capsule TAKE 1 CAPSULE BY MOUTH EVERY MORNING 30 MINUTES BEFORE BREAKFAST   oxyCODONE (OXY IR/ROXICODONE) 5 MG immediate release tablet Take 1 tablet (5 mg total) by mouth every 6 (six) hours as needed for severe pain (pain score 7-10).     Allergies:   Atenolol and Demerol   Social History   Socioeconomic History   Marital status:  Married    Spouse name: Not on file   Number of children: 0   Years of education: Not on file   Highest education level: Some college, no degree  Occupational History   Occupation: retired  Tobacco Use   Smoking status: Former    Current packs/day: 0.50    Average packs/day: 0.5 packs/day for 15.0 years (7.5 ttl pk-yrs)    Types: Cigarettes   Smokeless tobacco: Never  Vaping Use   Vaping status: Never Used  Substance and Sexual Activity   Alcohol use: Yes    Alcohol/week: 13.0 standard drinks of alcohol    Types: 1 Glasses of wine, 12 Cans of beer per week    Comment: drink beer   Drug use: No   Sexual activity: Yes    Birth control/protection: None  Other Topics Concern   Not on file  Social History Narrative   Married, no children.   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   No T/A/Ds.   Exercise: stationary bike/nautilus 5 days a week.         Social Determinants of Health   Financial Resource Strain: Low Risk  (05/08/2023)   Overall Financial Resource Strain (CARDIA)    Difficulty of Paying Living Expenses: Not hard at all  Food Insecurity: No Food Insecurity (10/20/2023)   Hunger Vital Sign    Worried About Running Out of Food in the Last Year: Never true    Ran Out of Food in the Last Year: Never true  Transportation Needs: No Transportation Needs (10/20/2023)   PRAPARE - Administrator, Civil Service (Medical): No    Lack of Transportation (Non-Medical): No  Physical Activity: Sufficiently Active (05/08/2023)   Exercise Vital Sign    Days of Exercise per Week: 5 days    Minutes of Exercise per Session: 90 min  Stress: No Stress Concern Present (06/14/2023)   [employer] of Occupational Health - Occupational Stress Questionnaire    Feeling of Stress : Not at all  Social Connections: Socially Isolated (06/14/2023)   Social Connection and Isolation Panel [NHANES]    Frequency of Communication with Friends and Family: Once a week    Frequency  of Social Gatherings with Friends and Family: Once a week    Attends Religious Services: Never    Database administrator or Organizations: No    Attends Engineer, structural: Patient declined    Marital Status: Married     Family History: The patient's family history includes Diabetes in his father; Hearing loss in his mother. There is no history of Colon cancer, Stomach cancer, or Esophageal cancer. ROS:   Please  see the history of present illness.    All 14 point review of systems negative except as described per history of present illness.  EKGs/Labs/Other Studies Reviewed:    The following studies were reviewed today:   EKG:  EKG Interpretation Date/Time:  Monday October 30 2023 10:16:46 EST Ventricular Rate:  54 PR Interval:  272 QRS Duration:  150 QT Interval:  466 QTC Calculation: 441 R Axis:   114  Text Interpretation: Sinus bradycardia with 1st degree A-V block Left bundle branch block When compared with ECG of 16-Oct-2023 00:21, Premature ventricular complexes are no longer Present QRS axis Shifted right T wave inversion now evident in Inferior leads T wave amplitude has decreased in Anterior leads Confirmed by Huntley Dec reddy [phone]) on 10/30/2023 10:26:20 AM    Recent Labs: 09/04/2023: ALT 14 10/15/2023: Hemoglobin 10.2; Platelets 176 10/16/2023: BUN 22; Creatinine, Ser 0.99; Magnesium 1.9; Potassium 4.1; Sodium 138  Recent Lipid Panel    Component Value Date/Time   CHOL 149 10/11/2023 0908   TRIG 38 10/11/2023 0908   HDL 48 10/11/2023 0908   CHOLHDL 3.1 10/11/2023 0908   VLDL 8 10/11/2023 0908   LDLCALC 93 10/11/2023 0908    Physical Exam:    VS:  BP (!) 124/56   Pulse (!) 54   Ht 5\' 8"  (1.727 m)   Wt 189 lb (85.7 kg)   SpO2 96%   BMI 28.74 kg/m     Wt Readings from Last 3 Encounters:  10/30/23 189 lb (85.7 kg)  10/18/23 190 lb 11.2 oz (86.5 kg)  10/05/23 200 lb (90.7 kg)     GENERAL:  Well nourished, well developed in no  acute distress NECK: No JVD; No carotid bruits CARDIAC: RRR, S1 and S2 present, no murmurs, no rubs, no gallops CHEST: Sternotomy site scar appears clean, well-healed.  Clear to auscultation without rales, wheezing or rhonchi  Extremities: No pitting pedal edema. Pulses bilaterally symmetric with radial 2+ and dorsalis pedis 2+ NEUROLOGIC:  Alert and oriented x 3  Medication Adjustments/Labs and Tests Ordered: Current medicines are reviewed at length with the patient today.  Concerns regarding medicines are outlined above.  Orders Placed This Encounter  Procedures   EKG 12-Lead   No orders of the defined types were placed in this encounter.   Signed, Cecille Amsterdam, MD, MPH, [facility]. 10/30/2023 10:55 AM    [facility]

## 2023-10-30 NOTE — Assessment & Plan Note (Signed)
Will follow-up with interval echocardiogram tentatively in 1 year.

## 2023-11-20 ENCOUNTER — Other Ambulatory Visit: Payer: Self-pay | Admitting: Thoracic Surgery (Cardiothoracic Vascular Surgery)

## 2023-11-20 DIAGNOSIS — Z951 Presence of aortocoronary bypass graft: Secondary | ICD-10-CM

## 2023-11-21 ENCOUNTER — Ambulatory Visit
Admission: RE | Admit: 2023-11-21 | Discharge: 2023-11-21 | Disposition: A | Discharge status: Home / Self Care | Payer: Medicare PPO | Source: Ambulatory Visit | Attending: Thoracic Surgery (Cardiothoracic Vascular Surgery)

## 2023-11-21 ENCOUNTER — Ambulatory Visit (INDEPENDENT_AMBULATORY_CARE_PROVIDER_SITE_OTHER): Payer: Self-pay | Admitting: Surgical

## 2023-11-21 VITALS — BP 114/67 | HR 50 | Resp 18 | Ht 68.0 in | Wt 189.0 lb

## 2023-11-21 DIAGNOSIS — Z951 Presence of aortocoronary bypass graft: Secondary | ICD-10-CM

## 2023-11-21 DIAGNOSIS — J90 Pleural effusion, not elsewhere classified: Secondary | ICD-10-CM | POA: Diagnosis not present

## 2023-11-21 NOTE — Progress Notes (Unsigned)
[address]       [place] [address]             [phone]      Leroy Proctor [facility] Medical Record #[id number] Date of Birth: 1951-07-12  Referring: Marykay Lex, MD Primary Care: Esperanza Richters, [place] Primary Cardiologist: None   Chief Complaint:   POST OP FOLLOW UP    10/12/2023 Patient:  Leroy Proctor Pre-Op Dx: 3V CAD HTN CKD HLP Bradycardia   Post-op Dx:  same Procedure: CABG X LIMA LAD, RSVG PDA, OM   Endoscopic greater saphenous vein harvest on the right     Surgeon and Role:      * Lightfoot, Eliezer Lofts, MD - Primary    Webb Laws , PA-C - assisting   History of Present Illness: Patient is a 72 year old male seen in the office on today's date and routine postsurgical follow-up.  He describes excellent progress overall.  He is anxious to get back to the [facility] to workout which is primarily walking and using the elliptical without his arms.  He has not had any fevers, chills or other significant constitutional symptoms.  He is not having chest pain/anginal equivalents.  He is not having shortness of breath.  He does have some intermittent lower extremity edema which is minor.  He is not having any palpitations.   He took pain medications for a very short time initially but does not take any more.  He is pleased with his progress.      Past Medical History:  Diagnosis Date   ABLA (acute blood loss anemia) 10/12/2023   Allergy    Arthritis    Bradycardia 10/02/2013   CAD, triple-vessel disease with calcium score 1624, CAD RADS 4 study, severe stenosis in LAD, moderate stenosis in LCx and RCA, hemodynamically significant by CT FFR in all 3 territories 10/05/2023   Cataract    Chest pain of uncertain etiology 09/19/2023   Chronic renal insufficiency, stage II (mild)    CrCl 60s   Cold sore 06/03/2014   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   Endotracheally intubated 10/12/2023    Erectile dysfunction    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   GERD (gastroesophageal reflux disease)    Health maintenance examination 10/16/2014   Herpes zoster    R side of face   HTN (hypertension) 11/21/2011   Hyperlipidemia    Impaired fasting glucose 2013-2015   Kidney stones    Left bundle branch block 09/19/2023   Personal history of kidney stones    Prediabetes 10/11/2023   Primary hypercholesterolemia 10/11/2023   Progressive angina (HCC) 10/10/2023   Recurrent herpes labialis    Routine general medical examination at a health care facility 10/02/2013   S/P CABG x 3 10/12/2023     Social History   Tobacco Use  Smoking Status Former   Current packs/day: 0.50   Average packs/day: 0.5 packs/day for 15.0 years (7.5 ttl pk-yrs)   Types: Cigarettes  Smokeless Tobacco Never    Social History   Substance and Sexual Activity  Alcohol Use Yes   Alcohol/week: 13.0 standard drinks of alcohol   Types: 1 Glasses of wine, 12 Cans of beer per week   Comment: drink beer     Allergies  Allergen Reactions   Atenolol     bradycarda- heart rate down to 38 bpm   Demerol Nausea And Vomiting    Current Outpatient Medications  Medication Sig Dispense Refill   amiodarone (PACERONE) 400 MG tablet Take 1 tablet (400 mg total) by mouth 2 (two) times daily. For 5 days then 400 mg once daily 35 tablet 1   amLODipine (NORVASC) 10 MG tablet Take 1 tablet (10 mg total) by mouth daily. 90 tablet 3   aspirin EC 325 MG tablet Take 1 tablet (325 mg total) by mouth daily.     atorvastatin (LIPITOR) 80 MG tablet Take 1 tablet (80 mg total) by mouth daily. 30 tablet 1   losartan (COZAAR) 25 MG tablet Take 1 tablet (25 mg total) by mouth daily. 30 tablet 1   metoprolol tartrate (LOPRESSOR) 25 MG tablet Take 1 tablet (25 mg total) by mouth 2 (two) times daily. 180 tablet 3   omeprazole (PRILOSEC) 20 MG capsule TAKE 1 CAPSULE BY MOUTH EVERY MORNING 30 MINUTES BEFORE BREAKFAST 90 capsule 3   oxyCODONE  (OXY IR/ROXICODONE) 5 MG immediate release tablet Take 1 tablet (5 mg total) by mouth every 6 (six) hours as needed for severe pain (pain score 7-10). 28 tablet 0   No current facility-administered medications for this visit.       Physical Exam: BP 114/67 (BP Location: Left Arm)   Pulse (!) 50   Resp 18   Ht 5\' 8"  (1.727 m)   Wt 189 lb (85.7 kg)   SpO2 94% Comment: RA  BMI 28.74 kg/m   General appearance: alert, cooperative, and no distress Heart: regular rate and rhythm Lungs: clear to auscultation bilaterally Abdomen: Benign exam Extremities: No edema Wound: Incisions healing well without evidence of infection   Diagnostic Studies & Laboratory data:     Recent Radiology Findings:   No results found.    Recent Lab Findings: Lab Results  Component Value Date   WBC 8.6 10/15/2023   HGB 10.2 (L) 10/15/2023   HCT 30.9 (L) 10/15/2023   PLT 176 10/15/2023   GLUCOSE 96 10/16/2023   CHOL 149 10/11/2023   TRIG 38 10/11/2023   HDL 48 10/11/2023   LDLCALC 93 10/11/2023   ALT 14 09/04/2023   AST 15 09/04/2023   NA 138 10/16/2023   K 4.1 10/16/2023   CL 103 10/16/2023   CREATININE 0.99 10/16/2023   BUN 22 10/16/2023   CO2 25 10/16/2023   TSH 0.76 11/03/2016   INR 1.5 (H) 10/12/2023   HGBA1C 5.5 10/11/2023      Assessment / Plan: Excellent ongoing progress.  I reviewed his chest x-ray and there are no concerning findings.  Did not make any changes to his current medication regimen.  He will continue to follow-up with cardiology.  We will see the patient again on a as needed basis for any surgically related needs or at request.      Medication Changes: No orders of the defined types were placed in this encounter.     Rowe Clack, PA-C  11/21/2023 2:27 PM

## 2023-11-21 NOTE — Patient Instructions (Signed)
Activity progression as discussed including driving.

## 2023-11-24 ENCOUNTER — Other Ambulatory Visit: Payer: Self-pay | Admitting: Medical

## 2023-11-24 DIAGNOSIS — I10 Essential (primary) hypertension: Secondary | ICD-10-CM

## 2023-12-05 ENCOUNTER — Telehealth (INDEPENDENT_AMBULATORY_CARE_PROVIDER_SITE_OTHER): Payer: Medicare PPO | Admitting: Medical

## 2023-12-05 VITALS — BP 133/62

## 2023-12-05 DIAGNOSIS — J40 Bronchitis, not specified as acute or chronic: Secondary | ICD-10-CM

## 2023-12-05 DIAGNOSIS — J3489 Other specified disorders of nose and nasal sinuses: Secondary | ICD-10-CM | POA: Diagnosis not present

## 2023-12-05 DIAGNOSIS — R059 Cough, unspecified: Secondary | ICD-10-CM | POA: Diagnosis not present

## 2023-12-05 MED ORDER — AMOXICILLIN-POT CLAVULANATE 875-125 MG PO TABS: 1.0000 | ORAL_TABLET | Freq: Two times a day (BID) | ORAL | 0 refills | Status: DC

## 2023-12-05 MED ORDER — BENZONATATE 100 MG PO CAPS: 100.0000 mg | ORAL_CAPSULE | Freq: Three times a day (TID) | ORAL | 0 refills | Status: DC | PRN

## 2023-12-05 MED ORDER — FLUTICASONE PROPIONATE 50 MCG/ACT NA SUSP: 2.0000 | Freq: Every day | NASAL | 1 refills | Status: AC

## 2023-12-05 NOTE — Patient Instructions (Signed)
 Post-operative recovery from cardiac surgery Patient reports gradual improvement in strength and endurance. No mention of chest pain or other concerning symptoms. -Continue current management and gradual increase in physical activity as tolerated.  Upper respiratory infection with sinusitis Symptoms started around Christmas with productive cough, chest congestion, sinus pressure, and nasal congestion. No fever reported. -Prescribe Augmentin  twice daily for 10 days. -Prescribe Flonase  nasal spray for nasal congestion. -Prescribe benzonatate  for cough. -If symptoms persist or worsen after 7 days, patient to return for in-person evaluation and possible chest x-ray.  Bradycardia Resting pulse noted to be 45, which is low. Patient has a history of regular exercise. -Advise patient to check blood pressure and pulse after mild activity and report back. -If pulse remains consistently below 50, further evaluation may be needed.  Follow-up with Cardiology Patient has a follow-up appointment with Cardiology in March. Patient inquired about the possibility of starting erectile dysfunction medication. Explained defer to cardiologist on ED med decision in future.

## 2023-12-05 NOTE — Progress Notes (Signed)
 Subjective:    Patient ID: Leroy Proctor, male    DOB: 04/02/1951, 72 y.o.   MRN: [id number]  HPI   Virtual Visit via Video Note  I connected with Leroy Proctor on 12/05/23 at 11:00 AM EST by a video enabled telemedicine application and verified that I am speaking with the correct person using two identifiers.  Location: Patient: home  Provider: office   I discussed the limitations of evaluation and management by telemedicine and the availability of in person appointments. The patient expressed understanding and agreed to proceed.  History of Present Illness: Discussed the use of AI scribe software for clinical note transcription with the patient, who gave verbal consent to proceed.  History of Present Illness   The patient, with a history of recent cardiac surgery, presents with a persistent cough and runny nose that began around Christmas. He reports a productive cough, particularly in the mornings, and describes a significant amount of mucus. The cough improves as he moves about during the day. He also reports sinus pressure and headaches in the frontal region. He has noticed mucus when blowing his nose, particularly in the morning. The patient also reports snoring and possible mouth breathing due to nasal congestion.  Post-surgery, the patient experienced discomfort and difficulty sleeping due to chest pain. He has been gradually regaining strength and has resumed light exercise, specifically using the elliptical at the local gym. He has been monitoring his blood pressure at home, with consistent readings, but has noted a low pulse rate.  The patient expresses gratitude for the push towards surgery and reports feeling better each day post-operation. He is eager to return to his regular exercise routine but understands the need for a cautious approach.        Observations/Objective: General-no acute distress, pleasant, oriented. Lungs- on inspection lungs appear unlabored. Neck-  no tracheal deviation or jvd on inspection. Neuro- gross motor function appears intact.   Assessment and Plan:  Assessment and Plan    Post-operative recovery from cardiac surgery Patient reports gradual improvement in strength and endurance. No mention of chest pain or other concerning symptoms. -Continue current management and gradual increase in physical activity as tolerated.  Upper respiratory infection with sinusitis Symptoms started around Christmas with productive cough, chest congestion, sinus pressure, and nasal congestion. No fever reported. -Prescribe Augmentin  twice daily for 10 days. -Prescribe Flonase  nasal spray for nasal congestion. -Prescribe benzonatate  for cough. -If symptoms persist or worsen after 7 days, patient to return for in-person evaluation and possible chest x-ray.  Bradycardia Resting pulse noted to be 45, which is low. Patient has a history of regular exercise. -Advise patient to check blood pressure and pulse after mild activity and report back. -If pulse remains consistently below 50, further evaluation may be needed.  Follow-up with Cardiology Patient has a follow-up appointment with Cardiology in March. Patient inquired about the possibility of starting erectile dysfunction medication. Explained defer to cardiologist on ED med decision in future.       Follow Up Instructions:    I discussed the assessment and treatment plan with the patient. The patient was provided an opportunity to ask questions and all were answered. The patient agreed with the plan and demonstrated an understanding of the instructions.   The patient was advised to call back or seek an in-person evaluation if the symptoms worsen or if the condition fails to improve as anticipated.  I   [name], PA-C      Review  of Systems       Objective:   Physical Exam        Assessment & Plan:

## 2023-12-07 ENCOUNTER — Other Ambulatory Visit: Payer: Self-pay | Admitting: Surgical

## 2023-12-08 ENCOUNTER — Encounter: Payer: Self-pay | Admitting: Medical

## 2023-12-13 ENCOUNTER — Other Ambulatory Visit: Payer: Self-pay | Admitting: Surgical

## 2023-12-14 ENCOUNTER — Other Ambulatory Visit: Payer: Self-pay | Admitting: Surgical

## 2023-12-18 ENCOUNTER — Other Ambulatory Visit: Payer: Self-pay | Admitting: Medical

## 2023-12-18 MED ORDER — ATORVASTATIN CALCIUM 80 MG PO TABS: 80.0000 mg | ORAL_TABLET | Freq: Every day | ORAL | 0 refills | Status: DC

## 2023-12-18 MED ORDER — LOSARTAN POTASSIUM 25 MG PO TABS: 25.0000 mg | ORAL_TABLET | Freq: Every day | ORAL | 0 refills | Status: DC

## 2023-12-18 NOTE — Telephone Encounter (Signed)
 Incorrect office

## 2023-12-18 NOTE — Telephone Encounter (Signed)
 Copied from CRM [id number]. Topic: Clinical - Medication Refill >> Dec 18, 2023 11:24 AM Richerd A wrote: Most Recent Primary Care Visit:  Provider: DORINA LOVING  Department: [facility]  Visit Type: OFFICE VISIT  Date: 12/05/2023  Medication: atorvastatin  (LIPITOR) 80 MG tablet ;   Has the patient contacted their pharmacy?  (Agent: If no, request that the patient contact the pharmacy for the refill. If patient does not wish to contact the pharmacy document the reason why and proceed with request.) (Agent: If yes, when and what did the pharmacy advise?)  Is this the correct pharmacy for this prescription?  If no, delete pharmacy and type the correct one.  This is the patient's preferred pharmacy:  [id number] - [place], [place] - [address] [place] [address] Phone: [phone] Fax: [phone]   Has the prescription been filled recently?   Is the patient out of the medication?   Has the patient been seen for an appointment in the last year OR does the patient have an upcoming appointment?   Can we respond through MyChart?   Agent: Please be advised that Rx refills may take up to 3 business days. We ask that you follow-up with your pharmacy.

## 2023-12-18 NOTE — Telephone Encounter (Signed)
 Copied from CRM [id number]. Topic: Clinical - Medication Refill >> Dec 18, 2023 11:30 AM Eleanor BROCKS wrote: Most Recent Primary Care Visit:  Provider: DORINA LOVING  Department: [facility]  Visit Type: OFFICE VISIT  Date: 12/05/2023  Medication: atorvastatin  (LIPITOR) 80 MG tablet losartan  (COZAAR ) 25 MG tablet  Has the patient contacted their pharmacy? No (Agent: If no, request that the patient contact the pharmacy for the refill. If patient does not wish to contact the pharmacy document the reason why and proceed with request.) (Agent: If yes, when and what did the pharmacy advise?)  Is this the correct pharmacy for this prescription? Yes If no, delete pharmacy and type the correct one.  This is the patient's preferred pharmacy:  [facility] [id number] - [place], [place] - [address] [place] [address] Phone: [phone] Fax: [phone]   Has the prescription been filled recently? Yes  Is the patient out of the medication? Yes (post surgical patient and doctor is on vacation)  Has the patient been seen for an appointment in the last year OR does the patient have an upcoming appointment? Yes  Can we respond through MyChart? Yes  Agent: Please be advised that Rx refills may take up to 3 business days. We ask that you follow-up with your pharmacy.

## 2023-12-26 ENCOUNTER — Telehealth: Payer: Self-pay

## 2023-12-26 ENCOUNTER — Other Ambulatory Visit: Payer: Self-pay | Admitting: Surgical

## 2023-12-26 MED ORDER — AMIODARONE HCL 200 MG PO TABS: 200.0000 mg | ORAL_TABLET | Freq: Every day | ORAL | 0 refills | Status: DC

## 2023-12-26 NOTE — Telephone Encounter (Signed)
*  STAT* If patient is at the pharmacy, call can be transferred to refill team.   1. Which medications need to be refilled? (please list name of each medication and dose if known) amiodarone (PACERONE) 400 MG tablet   2. Which pharmacy/location (including street and city if local pharmacy) is medication to be sent to? [facility] [id number] - [place], [place] - [address] Phone: [phone]  Fax: [phone]       Significant History/Details     3. Do they need a 30 day or 90 day supply? 90

## 2023-12-26 NOTE — Telephone Encounter (Addendum)
Called and spoke to patient and reviewed Dr. Anna Genre recommendation. Per Dr. Vincent Gros  patient should be taking Amiodarone 200 mg once a day until February 7th, then discontinue medication. . Patient verbalized understanding, and had no further questions. Rx sent to pharmacy.

## 2023-12-28 ENCOUNTER — Ambulatory Visit: Payer: Medicare PPO | Admitting: Medical

## 2023-12-29 ENCOUNTER — Ambulatory Visit: Payer: Medicare PPO | Admitting: Medical

## 2023-12-29 ENCOUNTER — Ambulatory Visit (HOSPITAL_BASED_OUTPATIENT_CLINIC_OR_DEPARTMENT_OTHER)
Admission: RE | Admit: 2023-12-29 | Discharge: 2023-12-29 | Disposition: A | Discharge status: Home / Self Care | Payer: Medicare PPO | Source: Ambulatory Visit | Attending: Medical | Admitting: Medical

## 2023-12-29 ENCOUNTER — Ambulatory Visit: Payer: Self-pay | Admitting: Medical

## 2023-12-29 VITALS — BP 136/56 | HR 54 | Resp 18 | Ht 68.0 in | Wt 190.2 lb

## 2023-12-29 DIAGNOSIS — M1611 Unilateral primary osteoarthritis, right hip: Secondary | ICD-10-CM | POA: Diagnosis not present

## 2023-12-29 DIAGNOSIS — M25551 Pain in right hip: Secondary | ICD-10-CM | POA: Diagnosis not present

## 2023-12-29 NOTE — Telephone Encounter (Signed)
Copied from CRM [id number]. Topic: Clinical - Red Word Triage >> Dec 29, 2023  8:13 AM Adele Barthel wrote: Red Word that prompted transfer to Nurse Triage: Patient has had persistent lower back pain since Monday. Pain was more severe on Monday and Tuesday, reported as a 10 on the pain scale. Pain is now around a 7 and is located on the right hand side. No swelling. No known triggers for pain, other than sitting for extended period of time on Sunday. Pain makes it difficult to move.   Chief Complaint: Back pain Symptoms: Lower back pain and hip pain Frequency: Ongoing for about a week Pertinent Negatives: Patient denies weakness Disposition: [] ED /[] Urgent Care (no appt availability in office) / [x] Appointment(In office/virtual)/ []  [facility]/ [] Home Care/ [] Refused Recommended Disposition /[]  Mobile Bus/ []  Follow-up with PCP Additional Notes: Patient has been having constant lower back and hip pain since the beginning of the week. The pain improves with Tylenol, but has not resolved. Appointment scheduled for this morning.  Reason for Disposition  [1] MODERATE back pain (e.g., interferes with normal activities) AND [2] present > 3 days  Answer Assessment - Initial Assessment Questions 1. ONSET: "When did the pain begin?"      Since beginning of week  2. LOCATION: "Where does it hurt?" (upper, mid or lower back)     Lower back and both hips  3. SEVERITY: "How bad is the pain?"  (e.g., Scale 1-10; mild, moderate, or severe)   - MILD (1-3): Doesn't interfere with normal activities.    - MODERATE (4-7): Interferes with normal activities or awakens from sleep.    - SEVERE (8-10): Excruciating pain, unable to do any normal activities.      7/10. Patient stated he is okay now, but the pain worsens when he is trying to roll hips from side to side  4. PATTERN: "Is the pain constant?" (e.g., yes, no; constant, intermittent)      Constant  5. RADIATION: "Does the pain shoot  into your legs or somewhere else?"     No  6. CAUSE:  "What do you think is causing the back pain?"      Possibly could be from sitting on couch Sunday night for 6 hours in a bad position  7. BACK OVERUSE:  "Any recent lifting of heavy objects, strenuous work or exercise?"     NO  8. MEDICINES: "What have you taken so far for the pain?" (e.g., nothing, acetaminophen, NSAIDS)     Tylenol twice a day, seems to be helping  9. NEUROLOGIC SYMPTOMS: "Do you have any weakness, numbness, or problems with bowel/bladder control?"     No  10. OTHER SYMPTOMS: "Do you have any other symptoms?" (e.g., fever, abdomen pain, burning with urination, blood in urine)       Has a cold  Protocols used: Back Pain-A-AH

## 2023-12-29 NOTE — Progress Notes (Signed)
   Subjective:    Patient ID: Leroy Proctor, male    DOB: [birth year], 73 y.o.   MRN: [id number]  HPI  Discussed the use of AI scribe software for clinical note transcription with the patient, who gave verbal consent to proceed.  History of Present Illness   The patient, with a history of hip similar to  pain episodes four to five years ago   Recently  presents with right hip pain that began on a Monday morning after spending the previous day sitting in an awkward position for an extended period. The pain, described as severe (10/10) initially, was located across the hip area and was particularly intense upon standing and pivoting. The patient reported almost falling due to the pain. However, once mobile, the pain was less severe.  Over the course of the week, the pain has decreased to a 5/10 and has become more transient, depending on the position. The patient describes it as a sharp pain in the upper buttocks and hip area, exacerbated by lateral movement and inward rotation of the hip. The pain does not seem to be affected by exercise, as the patient continues to use an elliptical machine for 15 minutes at a time with a 5-minute rest in between, twice a week.  The patient has been managing the pain with extra strength Tylenol since Wednesday of the same week.       Review of Systems See hpi    Objective:   Physical Exam  General- No acute distress. Pleasant patient. Neck- Full range of motion, no jvd Lungs- Clear, even and unlabored. Heart- regular rate and rhythm. Neurologic- CNII- XII grossly intact.  Back- no mid lumbar or si area pain. Rt hip- on palpation no obvious pain. No pain on abduction. Pt does report when stretching rt leg over left leg. Pain in lateral hip area.      Assessment & Plan:  For rt hip area pain presently will get rt hip xray(evaluate for degenerative changes). Pain has decreased since onset but still present. Advise low dose tylenol and can add low dose  ibuprofen.  Will follow xray and have you start hip execises. If xray negative but pain lingers consider referral sport med or PT  Follow up based on my chart update in 10 days.

## 2023-12-29 NOTE — Patient Instructions (Addendum)
For rt hip area pain presently will get rt hip xray(evaluate for degenerative changes). Pain has decreased since onset but still present. Advise low dose tylenol and can add low dose ibuprofen.  Will follow xray and have you start hip execises. If xray negative but pain lingers consider referral sport med or PT  Follow up based on my chart update in 10 days.  Hip Exercises Ask your health care provider which exercises are safe for you. Do exercises exactly as told by your provider and adjust them as told. It is normal to feel mild stretching, pulling, tightness, or discomfort as you do these exercises. Stop right away if you feel sudden pain or your pain gets worse. Do not begin these exercises until told by your provider. Stretching and range-of-motion exercises These exercises warm up your muscles and joints and improve the movement and flexibility of your hip. They also help to relieve pain, numbness, and tingling. You may be asked to limit your range of motion if you had a hip replacement. Talk to your provider about these limits. Hamstrings, supine  Lie on your back (supine position). Loop a belt, towel, or exercise band over the ball of your left / right foot. The ball of your foot is on the walking surface, right under your toes. Straighten your left / right knee and slowly pull on the belt, towel, or band to raise your leg until you feel a gentle stretch behind your knee (hamstring). Do not let your knee bend while you do this. Keep your other leg flat on the floor. Hold this position for __________ seconds. Slowly return your leg to the starting position. Repeat __________ times. Complete this exercise __________ times a day. Hip rotation  Lie on your back on a firm surface. With your left / right hand, gently pull your left / right knee toward the shoulder that is on the same side of the body. Stop when your knee is pointing toward the ceiling. Hold your left / right ankle with your  other hand. Keeping your knee steady, gently pull your left / right ankle toward your other shoulder until you feel a stretch in your butt. Keep your hips and shoulders firmly planted while you do this stretch. Hold this position for __________ seconds. Repeat __________ times. Complete this exercise __________ times a day. Seated stretch This exercise is sometimes called hamstrings and adductors stretch. Sit on the floor with your legs stretched wide. Keep your knees straight during this exercise. Keeping your head and back in a straight line, bend at your waist to reach for your left foot (position A). You should feel a stretch in your right inner thigh (adductors). Hold this position for __________ seconds. Then slowly return to the upright position. Keeping your head and back in a straight line, bend at your waist to reach forward (position B). You should feel a stretch behind both of your thighs and knees (hamstrings). Hold this position for __________ seconds. Then slowly return to the upright position. Keeping your head and back in a straight line, bend at your waist to reach for your right foot (position C). You should feel a stretch in your left inner thigh (adductors). Hold this position for __________ seconds. Then slowly return to the upright position. Repeat __________ times. Complete this exercise __________ times a day. Lunge This exercise stretches the muscles of the hip (hip flexors). Place your left / right knee on the floor and bend your other knee so that is  directly over your ankle. You should be half-kneeling. Keep good posture with your head over your shoulders. Tighten your butt muscles to point your tailbone downward. This will prevent your back from arching too much. You should feel a gentle stretch in the front of your left / right thigh and hip. If you do not feel a stretch, slide your other foot forward slightly and then slowly lunge forward with your chest up until  your knee once again lines up over your ankle. Make sure your tailbone continues to point downward. Hold this position for __________ seconds. Slowly return to the starting position. Repeat __________ times. Complete this exercise __________ times a day. Strengthening exercises These exercises build strength and endurance in your hip. Endurance is the ability to use your muscles for a long time, even after they get tired. Bridge This exercise strengthens the muscles of your hip (hip extensors). Lie on your back on a firm surface with your knees bent and your feet flat on the floor. Tighten your butt muscles and lift your bottom off the floor until the trunk of your body and your hips are level with your thighs. Do not arch your back. You should feel the muscles working in your butt and the back of your thighs. If you do not feel these muscles, slide your feet 1-2 inches (2.5-5 cm) farther away from your butt. Hold this position for __________ seconds. Slowly lower your hips to the starting position. Let your muscles relax completely between repetitions. Repeat __________ times. Complete this exercise __________ times a day. Straight leg raises, side-lying This exercise strengthens the muscles that move the hip joint away from the center of the body (hip abductors). Lie on your side with your left / right leg in the top position. Lie so your head, shoulder, hip, and knee line up. You may bend your bottom knee slightly to help you balance. Roll your hips slightly forward, so your hips are stacked directly over each other and your left / right knee is facing forward. Leading with your heel, lift your top leg 4-6 inches (10-15 cm). You should feel the muscles in your top hip lifting. Do not let your foot drift forward. Do not let your knee roll toward the ceiling. Hold this position for __________ seconds. Slowly return to the starting position. Let your muscles relax completely between  repetitions. Repeat __________ times. Complete this exercise __________ times a day. Straight leg raises, side-lying This exercise strengthens the muscles that move the hip joint toward the center of the body (hip adductors). Lie on your side with your left / right leg in the bottom position. Lie so your head, shoulder, hip, and knee line up. You may place your upper foot in front to help you balance. Roll your hips slightly forward, so your hips are stacked directly over each other and your left / right knee is facing forward. Tense the muscles in your inner thigh and lift your bottom leg 4-6 inches (10-15 cm). Hold this position for __________ seconds. Slowly return to the starting position. Let your muscles relax completely between repetitions. Repeat __________ times. Complete this exercise __________ times a day. Straight leg raises, supine This exercise strengthens the muscles in the front of your thigh (quadriceps and hip flexors). Lie on your back (supine position) with your left / right leg extended and your other knee bent. Tense the muscles in the front of your left / right thigh. You should see your kneecap slide up or  see increased dimpling just above your knee. Keep these muscles tight as you raise your leg 4-6 inches (10-15 cm) off the floor. Do not let your knee bend. Hold this position for __________ seconds. Keep these muscles tense as you lower your leg. Relax the muscles slowly and completely between repetitions. Repeat __________ times. Complete this exercise __________ times a day. Hip abductors, standing This exercise strengthens the muscles that move the leg and hip joint away from the center of the body (hip abductors). Tie one end of a rubber exercise band or tubing to a secure surface, such as a chair, table, or pole. Loop the other end of the band or tubing around your left / right ankle. Keeping your ankle with the band or tubing directly opposite the secured end,  step away until there is tension in the tubing or band. Hold on to a chair, table, or pole as needed for balance. Lift your left / right leg out to your side. While you do this: Keep your back upright. Keep your shoulders over your hips. Keep your toes pointing forward. Make sure to use your hip muscles to slowly lift your leg. Do not tip your body or forcefully lift your leg. Hold this position for __________ seconds. Slowly return to the starting position. Repeat __________ times. Complete this exercise __________ times a day. Squats This exercise strengthens the muscles in the front of your thigh (quadriceps). Stand in front of a table, or stand in a doorframe so your feet and knees are in line with the frame. You may place your hands on the table or frame for balance. Slowly bend your knees and lower your hips like you are going to sit in a chair. Keep your lower legs in a straight up-and-down position. Do not let your hips go lower than your knees. Do not bend your knees lower than told by your provider. If your hip pain increases, do not bend as low. Hold this position for ___________ seconds. Slowly push with your legs to return to standing. Do not use your hands to pull yourself to standing. Repeat __________ times. Complete this exercise __________ times a day. This information is not intended to replace advice given to you by your health care provider. Make sure you discuss any questions you have with your health care provider. Document Revised: 07/26/2022 Document Reviewed: 07/26/2022 Elsevier Patient Education  2024 [employer].

## 2024-01-04 ENCOUNTER — Other Ambulatory Visit: Payer: Self-pay | Admitting: Medical

## 2024-01-07 ENCOUNTER — Encounter: Payer: Self-pay | Admitting: Medical

## 2024-02-07 ENCOUNTER — Ambulatory Visit: Payer: Medicare PPO

## 2024-02-07 VITALS — BP 120/66 | HR 55 | Ht 68.0 in | Wt 190.1 lb

## 2024-02-07 DIAGNOSIS — I25118 Atherosclerotic heart disease of native coronary artery with other forms of angina pectoris: Secondary | ICD-10-CM | POA: Diagnosis not present

## 2024-02-07 DIAGNOSIS — I480 Paroxysmal atrial fibrillation: Secondary | ICD-10-CM

## 2024-02-07 NOTE — Progress Notes (Signed)
 Cardiology Consultation:    Date:  02/07/2024   ID:  Leroy Proctor, DOB [birth year], MRN [id number]  PCP:  Leroy Richters, PA-C  Cardiologist:  Leroy Corporal Claude Swendsen, MD   Referring MD: Leroy Proctor   No chief complaint on file.    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 Problem List Items Addressed This Visit     CAD calcium score 1624, CAD RADS 4 study on CT with abnormal FFR, cardiac cath 10/10/2023, CABG 10/12/2023 [LIMA-LAD, SVG-OM, SVG-PDA]. - Primary   Recovering well.  Good functional capacity.  Continues to take aspirin, continue with 81 mg once daily. Continue atorvastatin 80 mg once daily Continues on metoprolol tartrate 25 mg twice daily and amlodipine 10 mg once daily. From cardiac standpoint he is hemodynamically doing well with blood pressures 120/66 mmHg today.  Okay to use Cialis as needed.  Okay to proceed with colonoscopy elective procedure 6 months post CABG.  Continue aspirin uninterrupted.  Okay to proceed with exercising and shallow waters for swimming low intensity without aggressive shoulder movements.       Relevant Orders   EKG 12-Lead (Completed)   Postop atrial fibrillation after CABG 10/12/2023, started on amiodarone.  Currently sinus rhythm   Remains in sinus rhythm. Discontinue amiodarone and it has been over 4 months since his bypass. Cautioned him to watch out for any symptoms of palpitations. Will  tentatively schedule him for follow-up visit in 6 months. If he does have any recurrence of A-fib will obviously require anticoagulation for stroke prophylaxis.       Return to clinic in 6 months.    History of Present Illness:    Leroy Proctor is a 73 y.o. male who is being seen today for follow-up visit. Last visit with me in the office was 10/30/2023. PCP is  Saguier, Ramon Dredge, PA-C.   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  Mentions has been doing well.  Gradually building his activity walking.  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  He was wondering if he can start swimming low intensity back float and breaststroke without swinging his shoulders fully.  I told him this is fine and be mindful about any soreness in the chest wall.  He has been taking his medications well.  Blood pressures are well-controlled. He denies any palpitations or lightheadedness or dizziness or syncopal episodes.  He was also wondering if he can take Cialis as needed.  EKG in the clinic today sinus rhythm with heart rate 55/min, prolonged PR interval 276 ms, left bundle branch block  QRS duration 152 ms.   Past Medical History:  Diagnosis Date   ABLA (acute blood loss anemia) 10/12/2023   Allergy    Arthritis    Bradycardia 10/02/2013   CAD, triple-vessel disease with calcium score 1624, CAD RADS 4 study, severe stenosis in LAD, moderate stenosis in LCx and RCA, hemodynamically significant by CT  FFR in all 3 territories 10/05/2023   Cataract    Chest pain of uncertain etiology 09/19/2023   Chronic renal insufficiency, stage II (mild)    CrCl 60s   Cold sore 06/03/2014   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   Endotracheally intubated 10/12/2023   Erectile dysfunction    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  Currently sinus rhythm 10/30/2023   Prediabetes 10/11/2023   Primary hypercholesterolemia 10/11/2023   Progressive angina (HCC) 10/10/2023   Pulmonary nodules, small 3 mm on CT coronary imaging 09/2023; history of smoking, recommend 87-month follow-up 10/30/2023   Recurrent herpes labialis    Routine general medical examination at a health care facility 10/02/2013   S/P CABG x 3 10/12/2023    Past Surgical History:  Procedure Laterality Date   COLONOSCOPY  2002; 2014   Repeat 2024   CORONARY ARTERY BYPASS GRAFT N/A 10/12/2023   Procedure: CORONARY ARTERY BYPASS GRAFTING (CABG) X THREE, USING LEFT INTERNAL MAMMARY ARTERY AND RIGHT GREATER SAPHENEOUS VEIN HARVESTED ENDOSCOPICLY;  Surgeon: Corliss Skains, MD;  Location: [facility] OR;  Service: Open Heart Surgery;  Laterality: N/A;   LEFT HEART CATH AND CORONARY ANGIOGRAPHY N/A 10/10/2023   Procedure: LEFT HEART CATH AND CORONARY ANGIOGRAPHY;  Surgeon: Marykay Lex, MD;  Location: [facility] INVASIVE CV LAB;  Service: Cardiovascular;  Laterality: N/A;   LITHOTRIPSY     TEE WITHOUT CARDIOVERSION N/A 10/12/2023   Procedure:  TRANSESOPHAGEAL ECHOCARDIOGRAM;  Surgeon: Corliss Skains, MD;  Location: [facility] OR;  Service: Open Heart Surgery;  Laterality: N/A;    Current Medications: Current Meds  Medication Sig   amiodarone (PACERONE) 200 MG tablet Take 1 tablet (200 mg total) by mouth daily.   amLODipine (NORVASC) 10 MG tablet TAKE 1 TABLET BY MOUTH DAILY   aspirin EC 325 MG tablet Take 1 tablet (325 mg total) by mouth daily.   atorvastatin (LIPITOR) 80 MG tablet Take 1 tablet (80 mg total) by mouth daily.   fluticasone (FLONASE) 50 MCG/ACT nasal spray Place 2 sprays into both nostrils daily.   losartan (COZAAR) 25 MG tablet Take 1 tablet (25 mg total) by mouth daily.   metoprolol tartrate (LOPRESSOR) 25 MG tablet Take 1 tablet (25 mg total) by mouth 2 (two) times daily.   omeprazole (PRILOSEC) 20 MG capsule TAKE 1 CAPSULE BY MOUTH EVERY MORNING 30 MINUTES BEFORE BREAKFAST     Allergies:   Atenolol and Demerol   Social History   Socioeconomic History   Marital status: Married    Spouse name: Not on file   Number of children: 0   Years of education: Not on file  Highest education level: Some college, no degree  Occupational History   Occupation: retired  Tobacco Use   Smoking status: Former    Current packs/day: 0.50    Average packs/day: 0.5 packs/day for 15.0 years (7.5 ttl pk-yrs)    Types: Cigarettes   Smokeless tobacco: Never  Vaping Use   Vaping status: Never Used  Substance and Sexual Activity   Alcohol use: Yes    Alcohol/week: 13.0 standard drinks of alcohol    Types: 1 Glasses of wine, 12 Cans of beer per week    Comment: drink beer   Drug use: No   Sexual activity: Yes    Birth control/protection: None  Other Topics Concern   Not on file  Social History Narrative   Married, no children.   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   No T/A/Ds.   Exercise: stationary bike/nautilus 5 days a week.         Social Drivers of Corporate investment banker Strain: Low Risk   (12/04/2023)   Overall Financial Resource Strain (CARDIA)    Difficulty of Paying Living Expenses: Not hard at all  Food Insecurity: No Food Insecurity (12/04/2023)   Hunger Vital Sign    Worried About Running Out of Food in the Last Year: Never true    Ran Out of Food in the Last Year: Never true  Transportation Needs: Patient Declined (12/04/2023)   PRAPARE - Transportation    Lack of Transportation (Medical): Patient declined    Lack of Transportation (Non-Medical): Patient declined  Physical Activity: Sufficiently Active (12/04/2023)   Exercise Vital Sign    Days of Exercise per Week: 5 days    Minutes of Exercise per Session: 90 min  Stress: Stress Concern Present (12/04/2023)   [employer] of Occupational Health - Occupational Stress Questionnaire    Feeling of Stress : To some extent  Social Connections: Socially Isolated (12/04/2023)   Social Connection and Isolation Panel [NHANES]    Frequency of Communication with Friends and Family: Once a week    Frequency of Social Gatherings with Friends and Family: Once a week    Attends Religious Services: Never    Database administrator or Organizations: No    Attends Engineer, structural: Patient declined    Marital Status: Married     Family History: The patient's family history includes Diabetes in his father; Hearing loss in his mother. There is no history of Colon cancer, Stomach cancer, or Esophageal cancer. ROS:   Please see the history of present illness.    All 14 point review of systems negative except as described per history of present illness.  EKGs/Labs/Other Studies Reviewed:    The following studies were reviewed today:   EKG:       Recent Labs: 09/04/2023: ALT 14 10/15/2023: Hemoglobin 10.2; Platelets 176 10/16/2023: BUN 22; Creatinine, Ser 0.99; Magnesium 1.9; Potassium 4.1; Sodium 138  Recent Lipid Panel    Component Value Date/Time   CHOL 149 10/11/2023 0908   TRIG 38 10/11/2023 0908    HDL 48 10/11/2023 0908   CHOLHDL 3.1 10/11/2023 0908   VLDL 8 10/11/2023 0908   LDLCALC 93 10/11/2023 0908    Physical Exam:    VS:  BP 120/66   Pulse (!) 55   Ht 5\' 8"  (1.727 m)   Wt 190 lb 1.3 oz (86.2 kg)   SpO2 95%   BMI 28.90 kg/m     Wt Readings from Last 3  Encounters:  02/07/24 190 lb 1.3 oz (86.2 kg)  12/29/23 190 lb 3.2 oz (86.3 kg)  11/21/23 189 lb (85.7 kg)     GENERAL:  Well nourished, well developed in no acute distress NECK: No JVD; No carotid bruits CARDIAC: Sternotomy site well-healed.  RRR, S1 and S2 present, no murmurs, no rubs, no gallops CHEST:  Clear to auscultation without rales, wheezing or rhonchi  Extremities: No pitting pedal edema. Pulses bilaterally symmetric with radial 2+ and dorsalis pedis 2+ NEUROLOGIC:  Alert and oriented x 3  Medication Adjustments/Labs and Tests Ordered: Current medicines are reviewed at length with the patient today.  Concerns regarding medicines are outlined above.  Orders Placed This Encounter  Procedures   EKG 12-Lead   No orders of the defined types were placed in this encounter.   Signed, Cecille Amsterdam, MD, MPH, [facility]. 02/07/2024 3:06 PM    [facility]

## 2024-02-07 NOTE — Assessment & Plan Note (Signed)
 Remains in sinus rhythm. Discontinue amiodarone and it has been over 4 months since his bypass. Cautioned him to watch out for any symptoms of palpitations. Will tentatively schedule him for follow-up visit in 6 months. If he does have any recurrence of A-fib will obviously require anticoagulation for stroke prophylaxis.

## 2024-02-07 NOTE — Assessment & Plan Note (Addendum)
 Recovering well.  Good functional capacity.  Continues to take aspirin, continue with 81 mg once daily. Continue atorvastatin 80 mg once daily Continues on metoprolol tartrate 25 mg twice daily and amlodipine 10 mg once daily. From cardiac standpoint he is hemodynamically doing well with blood pressures 120/66 mmHg today.  Okay to use Cialis as needed.  Okay to proceed with colonoscopy elective procedure 6 months post CABG.  Continue aspirin uninterrupted.  Okay to proceed with exercising and shallow waters for swimming low intensity without aggressive shoulder movements.

## 2024-02-07 NOTE — Patient Instructions (Signed)
 Medication Instructions:  Your physician has recommended you make the following change in your medication:   Stop Amiodarone  Okay to use Cilias   *If you need a refill on your cardiac medications before your next appointment, please call your pharmacy*   Lab Work: None ordered If you have labs (blood work) drawn today and your tests are completely normal, you will receive your results only by: MyChart Message (if you have MyChart) OR A paper copy in the mail If you have any lab test that is abnormal or we need to change your treatment, we will call you to review the results.   Testing/Procedures: None ordered   Follow-Up: At [facility], you and your health needs are our priority.  As part of our continuing mission to provide you with exceptional heart care, we have created designated Provider Care Teams.  These Care Teams include your primary Cardiologist (physician) and Advanced Practice Providers (APPs -  Physician Assistants and Nurse Practitioners) who all work together to provide you with the care you need, when you need it.  We recommend signing up for the patient portal called "MyChart".  Sign up information is provided on this After Visit Summary.  MyChart is used to connect with patients for Virtual Visits (Telemedicine).  Patients are able to view lab/test results, encounter notes, upcoming appointments, etc.  Non-urgent messages can be sent to your provider as well.   To learn more about what you can do with MyChart, go to [email or web address].    Your next appointment:   6 month(s)  The format for your next appointment:   In Person  Provider:   Huntley Dec, MD    Other Instructions none  Important Information About Sugar

## 2024-02-13 ENCOUNTER — Telehealth: Payer: Self-pay | Admitting: Medical

## 2024-02-13 NOTE — Telephone Encounter (Signed)
 Pt states he was referred for a colonoscopy and would like to know where he should go. Did not see a referral or order on file. Scheduled on Friday to discuss new rx.

## 2024-02-16 ENCOUNTER — Ambulatory Visit: Admitting: Medical

## 2024-02-19 ENCOUNTER — Encounter: Payer: Self-pay | Admitting: Medical

## 2024-02-19 ENCOUNTER — Ambulatory Visit (INDEPENDENT_AMBULATORY_CARE_PROVIDER_SITE_OTHER): Admitting: Medical

## 2024-02-19 VITALS — BP 130/60 | HR 64 | Temp 98.0°F | Resp 18 | Ht 68.0 in | Wt 193.8 lb

## 2024-02-19 DIAGNOSIS — Z1211 Encounter for screening for malignant neoplasm of colon: Secondary | ICD-10-CM | POA: Diagnosis not present

## 2024-02-19 DIAGNOSIS — N529 Male erectile dysfunction, unspecified: Secondary | ICD-10-CM | POA: Diagnosis not present

## 2024-02-19 DIAGNOSIS — R739 Hyperglycemia, unspecified: Secondary | ICD-10-CM

## 2024-02-19 LAB — COMPREHENSIVE METABOLIC PANEL
ALT: 19 U/L (ref 0–53)
AST: 16 U/L (ref 0–37)
Albumin: 3.5 g/dL (ref 3.5–5.2)
Alkaline Phosphatase: 85 U/L (ref 39–117)
BUN: 20 mg/dL (ref 6–23)
CO2: 28 meq/L (ref 19–32)
Calcium: 8.6 mg/dL (ref 8.4–10.5)
Chloride: 103 meq/L (ref 96–112)
Creatinine, Ser: 1.16 mg/dL (ref 0.40–1.50)
GFR: 62.8 mL/min (ref >60.00)
Glucose, Bld: 104 mg/dL — ABNORMAL HIGH (ref 70–99)
Potassium: 4.5 meq/L (ref 3.5–5.1)
Sodium: 138 meq/L (ref 135–145)
Total Bilirubin: 0.7 mg/dL (ref 0.2–1.2)
Total Protein: 6.7 g/dL (ref 6.0–8.3)

## 2024-02-19 LAB — HEMOGLOBIN A1C: Hgb A1c MFr Bld: 6.5 % (ref 4.6–6.5)

## 2024-02-19 MED ORDER — DAPAGLIFLOZIN PROPANEDIOL 5 MG PO TABS: 5.0000 mg | ORAL_TABLET | Freq: Every day | ORAL | 3 refills | Status: DC

## 2024-02-19 MED ORDER — TADALAFIL 10 MG PO TABS
10.0000 mg | ORAL_TABLET | ORAL | 1 refills | Status: DC | PRN
Start: 2024-02-19 — End: 2024-04-22

## 2024-02-19 NOTE — Patient Instructions (Signed)
 Post-Coronary Artery Bypass Grafting (CABG) Recovering well with controlled blood pressure. Engaged in regular exercise. Current medications include aspirin, atorvastatin, metoprolol, and amlodipine. - Continue current medications as prescribed by cardiologist. - Encourage regular follow-up with cardiologist.  Hyperglycemia Recent labs show elevated blood glucose. Monitoring necessary to prevent diabetes progression. - Order metabolic panel and HbA1c. - Review lab results and advise on follow-up based on findings.  Colorectal Polyp Surveillance Colorectal polyps identified in 2014. Cleared for colonoscopy 6 months post-CABG. - Place referral for colonoscopy. - Instruct to follow up with gastroenterology if not contacted within a week.  Erectile Dysfunction Cleared to use Cialis. Tolerates well. Rx advisement/education given. - Prescribe Cialis 10 mg as needed. - Advise to inform healthcare providers/ED about Cialis use if presenting with chest pain.   Follow up date to be determined after lab review

## 2024-02-19 NOTE — Progress Notes (Signed)
 Subjective:    Patient ID: Leroy Proctor, male    DOB: [birth year], 73 y.o.   MRN: [id number]  HPI Discussed the use of AI scribe software for clinical note transcription with the patient, who gave verbal consent to proceed.  History of Present Illness   Leroy Proctor is a 73 year old male who presents for follow-up and medication management after coronary artery bypass grafting (CABG).  He is recovering well after his coronary artery bypass grafting (CABG). He is currently taking aspirin 81 mg daily, atorvastatin 80 mg daily, metoprolol 25 mg twice daily, and amlodipine 10 mg daily. He has resumed exercising, engaging in physical activity five days a week, including using the elliptical and rowing machine, and occasionally swimming.Liam Rogers up with his cardiologist.  He discusses the need to schedule a colonoscopy, which was initially planned around the time of his surgery. He has a history of polyps, with the last noted in November 2022, and a previous history of a polyp in 2014. Told can repeat the study 6 months post cabg.  He inquires about starting Cialis for erectile dysfunction. He has previously used Viagra but experienced headaches, whereas Cialis did not cause such side effects. He is not currently on nitroglycerin. His cardiologist has ok'd use of cialis.  His recent lab work from November 2024 indicated slightly elevated blood sugar levels, and he is not on diabetic medications.         Review of Systems  Constitutional:  Negative for chills and fever.  HENT:  Negative for congestion and ear discharge.   Respiratory:  Negative for cough, chest tightness and wheezing.   Cardiovascular:  Negative for chest pain and palpitations.  Gastrointestinal:  Negative for abdominal pain.  Musculoskeletal:  Negative for back pain and joint swelling.  Skin:  Negative for rash.  Neurological:  Negative for dizziness and numbness.  Hematological:  Negative for adenopathy. Does not  bruise/bleed easily.  Psychiatric/Behavioral:  Negative for behavioral problems, dysphoric mood and sleep disturbance. The patient is not nervous/anxious.      Past Medical History:  Diagnosis Date   ABLA (acute blood loss anemia) 10/12/2023   Allergy    Arthritis    Bradycardia 10/02/2013   CAD, triple-vessel disease with calcium score 1624, CAD RADS 4 study, severe stenosis in LAD, moderate stenosis in LCx and RCA, hemodynamically significant by CT FFR in all 3 territories 10/05/2023   Cataract    Chest pain of uncertain etiology 09/19/2023   Chronic renal insufficiency, stage II (mild)    CrCl 60s   Cold sore 06/03/2014   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   Endotracheally intubated 10/12/2023   Erectile dysfunction    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     Social History   Socioeconomic History   Marital status: Married    Spouse name: Not on file   Number of children: 0   Years of education: Not on file   Highest education level: Some college, no degree  Occupational  History   Occupation: retired  Tobacco Use   Smoking status: Former    Current packs/day: 0.50    Average packs/day: 0.5 packs/day for 15.0 years (7.5 ttl pk-yrs)    Types: Cigarettes   Smokeless tobacco: Never  Vaping Use   Vaping status: Never Used  Substance and Sexual Activity   Alcohol use: Yes    Alcohol/week: 13.0 standard drinks of alcohol    Types: 1 Glasses of wine, 12 Cans of beer per week    Comment: drink beer   Drug use: No   Sexual activity: Yes    Birth control/protection: None  Other Topics Concern   Not on file  Social History Narrative   Married, no children.   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   No T/A/Ds.   Exercise: stationary bike/nautilus 5 days a week.         Social Drivers of Corporate investment banker Strain: Low Risk  (12/04/2023)   Overall Financial Resource Strain (CARDIA)    Difficulty of Paying Living Expenses: Not hard at all  Food Insecurity: No Food Insecurity (12/04/2023)   Hunger Vital Sign    Worried About Running Out of Food in the Last Year: Never true    Ran Out of Food in the Last Year: Never true  Transportation Needs: Patient Declined (12/04/2023)   PRAPARE - Transportation    Lack of Transportation (Medical): Patient declined    Lack of Transportation (Non-Medical): Patient declined  Physical Activity: Sufficiently Active (12/04/2023)   Exercise Vital Sign    Days of Exercise per Week: 5 days    Minutes of Exercise per Session: 90 min  Stress: Stress Concern Present (12/04/2023)   [employer] of Occupational Health - Occupational Stress Questionnaire    Feeling of Stress : To some extent  Social Connections: Socially Isolated (12/04/2023)   Social Connection and Isolation Panel [NHANES]    Frequency of Communication with Friends and Family: Once a week    Frequency of Social Gatherings with Friends and Family: Once a week    Attends Religious Services: Never    Database administrator or Organizations:  No    Attends Banker Meetings: Patient declined    Marital Status: Married  Catering manager Violence: Not At Risk (10/20/2023)   Humiliation, Afraid, Rape, and Kick questionnaire    Fear of Current or Ex-Partner: No    Emotionally Abused: No    Physically Abused: No    Sexually Abused: No    Past Surgical History:  Procedure Laterality Date   COLONOSCOPY  2002; 2014   Repeat 2024   CORONARY ARTERY BYPASS GRAFT N/A 10/12/2023   Procedure: CORONARY ARTERY BYPASS GRAFTING (CABG) X THREE, USING LEFT INTERNAL MAMMARY ARTERY AND RIGHT GREATER SAPHENEOUS VEIN HARVESTED ENDOSCOPICLY;  Surgeon: Corliss Skains, MD;  Location: [facility] OR;  Service: Open Heart Surgery;  Laterality: N/A;   LEFT HEART CATH AND CORONARY ANGIOGRAPHY N/A 10/10/2023   Procedure: LEFT HEART CATH AND CORONARY ANGIOGRAPHY;  Surgeon: Marykay Lex, MD;  Location: [facility] INVASIVE CV  LAB;  Service: Cardiovascular;  Laterality: N/A;   LITHOTRIPSY     TEE WITHOUT CARDIOVERSION N/A 10/12/2023   Procedure: TRANSESOPHAGEAL ECHOCARDIOGRAM;  Surgeon: Corliss Skains, MD;  Location: [facility] OR;  Service: Open Heart Surgery;  Laterality: N/A;    Family History  Problem Relation Age of Onset   Hearing loss Mother    Diabetes Father    Colon cancer Neg Hx    Stomach cancer Neg Hx    Esophageal cancer Neg Hx     Allergies  Allergen Reactions   Atenolol     bradycarda- heart rate down to 38 bpm   Demerol Nausea And Vomiting    Current Outpatient Medications on File Prior to Visit  Medication Sig Dispense Refill   amiodarone (PACERONE) 200 MG tablet Take 1 tablet (200 mg total) by mouth daily. 18 tablet 0   amLODipine (NORVASC) 10 MG tablet TAKE 1 TABLET BY MOUTH DAILY 90 tablet 3   aspirin EC 325 MG tablet Take 1 tablet (325 mg total) by mouth daily.     atorvastatin (LIPITOR) 80 MG tablet Take 1 tablet (80 mg total) by mouth daily. 90 tablet 0   fluticasone (FLONASE) 50 MCG/ACT nasal spray Place 2 sprays into  both nostrils daily. 16 g 1   losartan (COZAAR) 25 MG tablet Take 1 tablet (25 mg total) by mouth daily. 90 tablet 0   metoprolol tartrate (LOPRESSOR) 25 MG tablet Take 1 tablet (25 mg total) by mouth 2 (two) times daily. 180 tablet 3   omeprazole (PRILOSEC) 20 MG capsule TAKE 1 CAPSULE BY MOUTH EVERY MORNING 30 MINUTES BEFORE BREAKFAST 90 capsule 3   No current facility-administered medications on file prior to visit.    BP 130/60   Pulse 64   Temp 98 F (36.7 C)   Resp 18   Ht 5\' 8"  (1.727 m)   Wt 193 lb 12.8 oz (87.9 kg)   SpO2 95%   BMI 29.47 kg/m       Objective:   Physical Exam  General Mental Status- Alert. General Appearance- Not in acute distress.   Skin General: Color- Normal Color. Moisture- Normal Moisture.  Neck Carotid Arteries- Normal color. Moisture- Normal Moisture. No carotid bruits. No JVD.  Chest and Lung Exam Auscultation: Breath Sounds:-Normal.  Cardiovascular Auscultation:Rythm- Regular. Murmurs & Other Heart Sounds:Auscultation of the heart reveals- No Murmurs.  Abdomen Inspection:-Inspeection Normal. Palpation/Percussion:Note:No mass. Palpation and Percussion of the abdomen reveal- Non Tender, Non Distended + BS, no rebound or guarding.   Neurologic Cranial Nerve exam:- CN III-XII intact(No nystagmus), symmetric smile. Strength:- 5/5 equal and symmetric strength both upper and lower extremities.       Assessment & Plan:   Assessment and Plan    Post-Coronary Artery Bypass Grafting (CABG) Recovering well with controlled blood pressure. Engaged in regular exercise. Current medications include aspirin, atorvastatin, metoprolol, and amlodipine. - Continue current medications as prescribed by cardiologist. - Encourage regular follow-up with cardiologist.  Hyperglycemia Recent labs show elevated blood glucose. Monitoring necessary to prevent diabetes progression. - Order metabolic panel and HbA1c. - Review lab results and advise on  follow-up based on findings.  Colorectal Polyp Surveillance Colorectal polyps identified in 2014. Cleared for colonoscopy 6 months post-CABG. - Place referral for colonoscopy. - Instruct to follow up with gastroenterology if not contacted within a week.  Erectile Dysfunction Cleared to use Cialis. Tolerates well. Rx advisement/education given. - Prescribe Cialis 10 mg as needed. - Advise to inform healthcare providers/ED about Cialis use  if presenting with chest pain.   Follow up date to be determined after lab review        Esperanza Richters, PA-C

## 2024-02-19 NOTE — Addendum Note (Signed)
 Addended by: Gwenevere Abbot on: 02/19/2024 08:10 PM   Modules accepted: Orders

## 2024-02-28 ENCOUNTER — Encounter: Payer: Self-pay | Admitting: Internal Medicine

## 2024-03-04 ENCOUNTER — Other Ambulatory Visit: Payer: Self-pay | Admitting: Medical

## 2024-03-06 ENCOUNTER — Other Ambulatory Visit (HOSPITAL_BASED_OUTPATIENT_CLINIC_OR_DEPARTMENT_OTHER): Payer: Self-pay

## 2024-03-08 ENCOUNTER — Ambulatory Visit: Admitting: Medical

## 2024-03-08 ENCOUNTER — Encounter: Payer: Self-pay | Admitting: Medical

## 2024-03-08 VITALS — BP 112/68 | HR 54 | Temp 97.5°F | Resp 16 | Ht 68.0 in | Wt 188.2 lb

## 2024-03-08 DIAGNOSIS — M6740 Ganglion, unspecified site: Secondary | ICD-10-CM

## 2024-03-08 NOTE — Patient Instructions (Addendum)
 Ganglion cyst vs. Lipoma Small bump near elbow likely a ganglion cyst due to joint proximity. Lipoma less likely but considered. - Refer to orthopedist for evaluation and management. - Instructed to wait 7-10 days for orthopedist's call. - Advised to contact office if no call within 7-10 days.(If changes during interim let us know)

## 2024-03-08 NOTE — Progress Notes (Signed)
 Subjective:    Patient ID: Leroy Proctor, male    DOB: 01-Feb-1951, 73 y.o.   MRN: [id number]  HPI   Leroy Proctor is a 73 year old male who presents with a bump on his arm.  He noticed a bump on his arm approximately one week ago. The bump is about the size of a pea and is located near his elbow. He experiences occasional discomfort from the bump, particularly when dragging his arm across the bed, but it does not consistently cause pain. No significant pain is associated with the bump, and there are no other symptoms such as redness, warmth, or swelling in the area. He has not sought any prior treatment and is unsure of the nature of the bump, questioning whether it could be a ganglion cyst or a lipoma.     Review of Systems  Constitutional:  Negative for chills, fatigue and fever.  Respiratory:  Negative for cough, chest tightness, shortness of breath and wheezing.   Cardiovascular:  Negative for leg swelling.  Gastrointestinal:  Negative for abdominal pain, constipation and rectal pain.  Genitourinary:  Negative for dysuria, flank pain, genital sores and hematuria.  Musculoskeletal:  Negative for back pain, joint swelling and neck pain.  Skin:  Negative for rash.  Neurological:  Negative for dizziness, syncope, weakness and headaches.  Hematological:  Negative for adenopathy. Does not bruise/bleed easily.  Psychiatric/Behavioral:  Negative for behavioral problems and suicidal ideas. The patient is not nervous/anxious.    Past Medical History:  Diagnosis Date   ABLA (acute blood loss anemia) 10/12/2023   Allergy    Arthritis    Bradycardia 10/02/2013   CAD, triple-vessel disease with calcium score 1624, CAD RADS 4 study, severe stenosis in LAD, moderate stenosis in LCx and RCA, hemodynamically significant by CT FFR in all 3 territories 10/05/2023   Cataract    Chest pain of uncertain etiology 09/19/2023   Chronic renal insufficiency, stage II (mild)    CrCl 60s   Cold sore  06/03/2014   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   Endotracheally intubated 10/12/2023   Erectile dysfunction    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     Social History   Socioeconomic History   Marital status: Married    Spouse name: Not on file   Number of children: 0   Years of education: Not on file   Highest education level: Some college, no degree  Occupational History   Occupation: retired  Tobacco Use   Smoking status: Former    Current packs/day: 0.50    Average packs/day: 0.5 packs/day for 15.0  years (7.5 ttl pk-yrs)    Types: Cigarettes   Smokeless tobacco: Never  Vaping Use   Vaping status: Never Used  Substance and Sexual Activity   Alcohol use: Yes    Alcohol/week: 13.0 standard drinks of alcohol    Types: 1 Glasses of wine, 12 Cans of beer per week    Comment: drink beer   Drug use: No   Sexual activity: Yes    Birth control/protection: None  Other Topics Concern   Not on file  Social History Narrative   Married, no children.   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   No T/A/Ds.   Exercise: stationary bike/nautilus 5 days a week.         Social Drivers of Corporate investment banker Strain: Low Risk  (12/04/2023)   Overall Financial Resource Strain (CARDIA)    Difficulty of Paying Living Expenses: Not hard at all  Food Insecurity: No Food Insecurity (12/04/2023)   Hunger Vital Sign    Worried About Running Out of Food in the Last Year: Never true    Ran Out of Food in the Last Year: Never true  Transportation Needs: Patient Declined (12/04/2023)   PRAPARE - Transportation    Lack of Transportation (Medical): Patient declined    Lack of Transportation (Non-Medical): Patient declined  Physical Activity: Sufficiently Active (12/04/2023)   Exercise Vital Sign    Days of Exercise per Week: 5 days    Minutes of Exercise per Session: 90 min  Stress: Stress Concern Present (12/04/2023)   [employer] of Occupational Health - Occupational Stress Questionnaire    Feeling of Stress : To some extent  Social Connections: Socially Isolated (12/04/2023)   Social Connection and Isolation Panel [NHANES]    Frequency of Communication with Friends and Family: Once a week    Frequency of Social Gatherings with Friends and Family: Once a week    Attends Religious Services: Never    Database administrator or Organizations: No    Attends Banker Meetings: Patient declined    Marital Status: Married  Catering manager Violence: Not At Risk (10/20/2023)   Humiliation, Afraid, Rape, and Kick questionnaire    Fear of Current or Ex-Partner: No    Emotionally Abused: No    Physically Abused: No    Sexually Abused: No    Past Surgical History:  Procedure Laterality Date   COLONOSCOPY  2002; 2014   Repeat 2024   CORONARY ARTERY BYPASS GRAFT N/A 10/12/2023   Procedure: CORONARY ARTERY BYPASS GRAFTING (CABG) X THREE, USING LEFT INTERNAL MAMMARY ARTERY AND RIGHT GREATER SAPHENEOUS VEIN HARVESTED ENDOSCOPICLY;  Surgeon: Corliss Skains, MD;  Location: [facility] OR;  Service: Open Heart Surgery;  Laterality: N/A;   LEFT HEART CATH AND CORONARY ANGIOGRAPHY N/A 10/10/2023   Procedure: LEFT HEART CATH AND CORONARY ANGIOGRAPHY;  Surgeon: Marykay Lex, MD;  Location: [facility] INVASIVE CV LAB;  Service: Cardiovascular;  Laterality: N/A;   LITHOTRIPSY     TEE WITHOUT CARDIOVERSION N/A 10/12/2023   Procedure: TRANSESOPHAGEAL ECHOCARDIOGRAM;  Surgeon: Corliss Skains, MD;  Location: [facility] OR;  Service: Open Heart Surgery;  Laterality: N/A;    Family History  Problem Relation Age of Onset   Hearing loss Mother    Diabetes Father    Colon cancer Neg Hx    Stomach cancer Neg Hx    Esophageal cancer Neg Hx     Allergies  Allergen Reactions   Atenolol  bradycarda- heart rate down to 38 bpm   Demerol Nausea And Vomiting    Current Outpatient Medications on File Prior to Visit  Medication Sig Dispense Refill   amiodarone (PACERONE) 200 MG tablet Take 1 tablet (200 mg total) by mouth daily. 18 tablet 0   amLODipine (NORVASC) 10 MG tablet TAKE 1 TABLET BY MOUTH DAILY 90 tablet 3   aspirin EC 325 MG tablet Take 1 tablet (325 mg total) by mouth daily.     atorvastatin (LIPITOR) 80 MG tablet TAKE 1 TABLET BY MOUTH DAILY 90 tablet 0   dapagliflozin propanediol (FARXIGA) 5 MG TABS tablet Take 1 tablet (5 mg total) by mouth daily before breakfast. 90 tablet 3   fluticasone (FLONASE) 50 MCG/ACT nasal spray Place 2 sprays into both nostrils daily. 16 g 1   losartan (COZAAR) 25 MG tablet TAKE 1 TABLET BY MOUTH DAILY 90 tablet 0   metoprolol tartrate (LOPRESSOR) 25 MG tablet Take 1 tablet (25 mg total) by mouth 2 (two) times daily. 180 tablet 3   omeprazole (PRILOSEC) 20 MG capsule TAKE 1 CAPSULE BY MOUTH EVERY MORNING 30 MINUTES BEFORE BREAKFAST 90 capsule 3   tadalafil (CIALIS) 10 MG tablet Take 1 tablet (10 mg total) by mouth every other day as needed for erectile dysfunction. 10 tablet 1   No current facility-administered  medications on file prior to visit.    BP 112/68   Pulse (!) 54   Temp (!) 97.5 F (36.4 C) (Oral)   Resp 16   Ht 5\' 8"  (1.727 m)   Wt 188 lb 3.2 oz (85.4 kg)   SpO2 97%   BMI 28.62 kg/m           Objective:   Physical Exam  General- No acute distress. Pleasant patient. Neck- Full range of motion, no jvd Lungs- Clear, even and unlabored. Heart- regular rate and rhythm. Neurologic- CNII- XII grossly intact.  Rt upper ext- in area just below lateral epicondyle small pea sized lump. Ganglon cyst vs lipoma.     Assessment & Plan:   Patient Instructions  Ganglion cyst vs. Lipoma Small bump near elbow likely a ganglion cyst due to joint proximity. Lipoma less likely but considered. - Refer to orthopedist for evaluation and management. - Instructed to wait 7-10 days for orthopedist's call. - Advised to contact office if no call within 7-10 days.   Esperanza Richters, PA-C

## 2024-03-15 DIAGNOSIS — M79672 Pain in left foot: Secondary | ICD-10-CM | POA: Diagnosis not present

## 2024-03-15 DIAGNOSIS — L84 Corns and callosities: Secondary | ICD-10-CM | POA: Diagnosis not present

## 2024-03-15 DIAGNOSIS — M216X2 Other acquired deformities of left foot: Secondary | ICD-10-CM | POA: Diagnosis not present

## 2024-03-19 ENCOUNTER — Encounter: Payer: Self-pay | Admitting: Physician Assistant

## 2024-03-19 ENCOUNTER — Ambulatory Visit: Admitting: Physician Assistant

## 2024-03-19 ENCOUNTER — Other Ambulatory Visit (INDEPENDENT_AMBULATORY_CARE_PROVIDER_SITE_OTHER)

## 2024-03-19 DIAGNOSIS — M25521 Pain in right elbow: Secondary | ICD-10-CM

## 2024-03-19 NOTE — Progress Notes (Signed)
 Office Visit Note   Patient: Leroy Proctor           Date of Birth: 1951-06-05           MRN: [id number] Visit Date: 03/19/2024              Requested by: Esperanza Richters, PA-C [id number] [place],  [place] [address] PCP: Esperanza Richters, PA-C   Assessment & Plan: Visit Diagnoses:  1. Pain in right elbow     Plan: Patient is a pleasant 73 year old gentleman who 3 weeks ago was lifting overhead following this he had a large accumulation of what he felt was a cyst on the outside of his elbow.  Did not have any feel or chills.  He said it was quite large a few weeks ago and now its gotten very very small.  It is adjacent to the extensor tendon on the lateral side of the elbow.  I can only palpate a very small pea-sized cyst at that time.  He is not having any pain and strength is intact I told him to treat this symptomatically with Voltaren no concerns on x-ray can follow-up if it returns  Follow-Up Instructions: Return if symptoms worsen or fail to improve.   Orders:  Orders Placed This Encounter  Procedures   XR Elbow 2 Views Right   No orders of the defined types were placed in this encounter.     Procedures: No procedures performed   Clinical Data: No additional findings.   Subjective: No chief complaint on file.   HPI pleasant active 73 year old right-hand-dominant gentleman presents today with a history of 3 weeks ago lifting some overhead and then developing a large cyst on the lateral side of his elbow does not really have much pain states now its much much smaller  Review of Systems  All other systems reviewed and are negative.    Objective: Vital Signs: There were no vitals taken for this visit.  Physical Exam Constitutional:      Appearance: Normal appearance.  Pulmonary:     Effort: Pulmonary effort is normal.  Skin:    General: Skin is warm and dry.  Neurological:     General: No focal deficit present.     Mental Status: He is  alert and oriented to person, place, and time.  Psychiatric:        Mood and Affect: Mood normal.        Behavior: Behavior normal.     Ortho Exam Examination of his elbow he has good flexion and extension pronation supination good strength with resisted rotation as well as flexion and extension.  No tenderness over the lateral epicondyle very small cystic structure palpated about the size of a pea near the extensor tendon.  This is not painful Specialty Comments:  No specialty comments available.  Imaging: XR Elbow 2 Views Right Result Date: 03/19/2024 Radiographs of the right elbow demonstrate some traction spurring at the olecranon no evidence of fracture no evidence of effusion    PMFS History: Patient Active Problem List   Diagnosis Date Noted   Postop atrial fibrillation after CABG 10/12/2023, started on amiodarone.  Currently sinus rhythm 10/30/2023   Mild mitral regurgitation noted on IntraOp TEE 10/12/2023 10/30/2023   Pulmonary nodules, small 3 mm on CT coronary imaging 09/2023; history of smoking, recommend 64-month follow-up 10/30/2023   S/P CABG x 3 10/12/2023   Endotracheally intubated 10/12/2023   ABLA (acute blood loss anemia) 10/12/2023  Prediabetes 10/11/2023   Primary hypercholesterolemia 10/11/2023   Progressive angina (HCC) 10/10/2023   CAD calcium score 1624, CAD RADS 4 study on CT with abnormal FFR, cardiac cath 10/10/2023, CABG 10/12/2023 [LIMA-LAD, SVG-OM, SVG-PDA]. 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   Health maintenance examination 10/16/2014   Impaired fasting glucose 10/16/2014   Recurrent herpes labialis 06/30/2014    Cold sore 06/03/2014   Routine general medical examination at a health care facility 10/02/2013   Bradycardia 10/02/2013   Erectile dysfunction 08/27/2012   HTN (hypertension) 11/21/2011   Hyperlipidemia 11/21/2011   Kidney stones 11/21/2011   Past Medical History:  Diagnosis Date   ABLA (acute blood loss anemia) 10/12/2023   Allergy    Arthritis    Bradycardia 10/02/2013   CAD, triple-vessel disease with calcium score 1624, CAD RADS 4 study, severe stenosis in LAD, moderate stenosis in LCx and RCA, hemodynamically significant by CT FFR in all 3 territories 10/05/2023   Cataract    Chest pain of uncertain etiology 09/19/2023   Chronic renal insufficiency, stage II (mild)    CrCl 60s   Cold sore 06/03/2014   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   Endotracheally intubated 10/12/2023   Erectile dysfunction    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  Currently sinus rhythm 10/30/2023   Prediabetes 10/11/2023   Primary hypercholesterolemia 10/11/2023   Progressive angina (HCC) 10/10/2023   Pulmonary nodules, small 3 mm on CT coronary imaging 09/2023; history of smoking, recommend 54-month follow-up 10/30/2023   Recurrent herpes labialis    Routine general medical examination at a health care facility 10/02/2013   S/P CABG x 3 10/12/2023    Family History  Problem Relation Age of Onset   Hearing loss Mother    Diabetes Father    Colon cancer Neg Hx    Stomach cancer Neg  Hx    Esophageal cancer Neg Hx     Past Surgical History:  Procedure Laterality Date   COLONOSCOPY  2002; 2014   Repeat 2024   CORONARY ARTERY BYPASS GRAFT N/A 10/12/2023   Procedure: CORONARY ARTERY BYPASS GRAFTING (CABG) X THREE, USING LEFT INTERNAL MAMMARY ARTERY AND RIGHT GREATER SAPHENEOUS VEIN HARVESTED ENDOSCOPICLY;  Surgeon: Hilarie Lovely, MD;  Location: [facility] OR;  Service: Open Heart Surgery;  Laterality: N/A;   LEFT HEART CATH AND CORONARY ANGIOGRAPHY N/A 10/10/2023   Procedure: LEFT HEART CATH AND CORONARY ANGIOGRAPHY;  Surgeon: Arleen Lacer, MD;  Location: [facility] INVASIVE CV LAB;  Service: Cardiovascular;  Laterality: N/A;   LITHOTRIPSY     TEE WITHOUT CARDIOVERSION N/A 10/12/2023   Procedure: TRANSESOPHAGEAL ECHOCARDIOGRAM;  Surgeon: Starleen Eastern  O, MD;  Location: [facility] OR;  Service: Open Heart Surgery;  Laterality: N/A;   Social History   Occupational History   Occupation: retired  Tobacco Use   Smoking status: Former    Current packs/day: 0.50    Average packs/day: 0.5 packs/day for 15.0 years (7.5 ttl pk-yrs)    Types: Cigarettes   Smokeless tobacco: Never  Vaping Use   Vaping status: Never Used  Substance and Sexual Activity   Alcohol use: Yes    Alcohol/week: 13.0 standard drinks of alcohol    Types: 1 Glasses of wine, 12 Cans of beer per week    Comment: drink beer   Drug use: No   Sexual activity: Yes    Birth control/protection: None

## 2024-04-08 ENCOUNTER — Ambulatory Visit (AMBULATORY_SURGERY_CENTER)

## 2024-04-08 VITALS — Ht 68.0 in | Wt 185.0 lb

## 2024-04-08 DIAGNOSIS — Z1211 Encounter for screening for malignant neoplasm of colon: Secondary | ICD-10-CM

## 2024-04-08 MED ORDER — NA SULFATE-K SULFATE-MG SULF 17.5-3.13-1.6 GM/177ML PO SOLN: 1.0000 | Freq: Once | ORAL | 0 refills | Status: AC

## 2024-04-08 NOTE — Progress Notes (Signed)
 No egg or soy allergy known to patient  No issues known to pt with past sedation with any surgeries or procedures Patient denies ever being told they had issues or difficulty with intubation  No FH of Malignant Hyperthermia Pt is not on diet pills Pt is not on  home 02  Pt is not on blood thinners  Pt denies issues with constipation  No A fib or A flutter (Hx of post op Afib) Have any cardiac testing pending--No Pt can ambulate  Pt denies use of chewing tobacco Discussed diabetic I weight loss medication holds Discussed NSAID holds Checked BMI Pt instructed to use Singlecare.com or GoodRx for a price reduction on prep  Patient's chart reviewed by Rogena Class CNRA prior to previsit and patient appropriate for the [facility].  Pre visit completed and red dot placed by patient's name on their procedure day (on provider's schedule).

## 2024-04-16 ENCOUNTER — Encounter: Payer: Self-pay | Admitting: Internal Medicine

## 2024-04-22 ENCOUNTER — Other Ambulatory Visit: Payer: Self-pay | Admitting: Medical

## 2024-05-07 NOTE — Progress Notes (Unsigned)
  Gastroenterology History and Physical   Primary Care Physician:  Sylvia Everts, PA-C   Reason for Procedure:  Colon cancer screening  Plan:    Colonoscopy     HPI: Leroy Proctor is a 72 y.o. male presenting for repeat screening colonoscopy exam.  Exam in 2014 was negative (diverticulosis and distal hyperplastic polyps)   Past Medical History:  Diagnosis Date   ABLA (acute blood loss anemia) 10/12/2023   Allergy    Arthritis    Bradycardia 10/02/2013   CAD, triple-vessel disease with calcium  score 1624, CAD RADS 4 study, severe stenosis in LAD, moderate stenosis in LCx and RCA, hemodynamically significant by CT FFR in all 3 territories 10/05/2023   Cataract    Chest pain of uncertain etiology 09/19/2023   Chronic renal insufficiency, stage II (mild)    CrCl 60s   Cold sore 06/03/2014   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   Endotracheally intubated 10/12/2023   Erectile dysfunction    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  Currently sinus rhythm 10/30/2023   Prediabetes 10/11/2023   Primary hypercholesterolemia 10/11/2023   Progressive angina (HCC) 10/10/2023   Pulmonary nodules, small 3 mm on CT coronary imaging 09/2023; history of smoking, recommend 35-month follow-up 10/30/2023   Recurrent herpes labialis    Routine general medical examination at a health care facility 10/02/2013   S/P CABG x 3 10/12/2023    Past  Surgical History:  Procedure Laterality Date   COLONOSCOPY  2002; 2014   Repeat 2024   CORONARY ARTERY BYPASS GRAFT N/A 10/12/2023   Procedure: CORONARY ARTERY BYPASS GRAFTING (CABG) X THREE, USING LEFT INTERNAL MAMMARY ARTERY AND RIGHT GREATER SAPHENEOUS VEIN HARVESTED ENDOSCOPICLY;  Surgeon: Hilarie Lovely, MD;  Location: [facility] OR;  Service: Open Heart Surgery;  Laterality: N/A;   LEFT HEART CATH AND CORONARY ANGIOGRAPHY N/A 10/10/2023   Procedure: LEFT HEART CATH AND CORONARY ANGIOGRAPHY;  Surgeon: Arleen Lacer, MD;  Location: [facility] INVASIVE CV LAB;  Service: Cardiovascular;  Laterality: N/A;   LITHOTRIPSY     TEE WITHOUT CARDIOVERSION N/A 10/12/2023   Procedure: TRANSESOPHAGEAL ECHOCARDIOGRAM;  Surgeon: Hilarie Lovely, MD;  Location: [facility] OR;  Service: Open Heart Surgery;  Laterality: N/A;      Current Outpatient Medications  Medication Sig Dispense Refill   amiodarone  (PACERONE ) 200 MG tablet Take 1 tablet (200 mg total) by mouth daily. 18 tablet 0   amLODipine  (NORVASC ) 10 MG tablet TAKE 1 TABLET BY MOUTH DAILY 90 tablet 3   aspirin  EC 325 MG tablet Take 1 tablet (325 mg total) by mouth daily.     atorvastatin  (LIPITOR) 80 MG tablet TAKE 1 TABLET BY MOUTH DAILY 90 tablet 0   dapagliflozin  propanediol (FARXIGA ) 5 MG TABS tablet Take 1 tablet (5 mg total) by mouth daily before breakfast. 90 tablet 3   losartan  (COZAAR ) 25 MG tablet TAKE 1 TABLET BY MOUTH DAILY 90 tablet 0   metoprolol  tartrate (LOPRESSOR ) 25  MG tablet Take 1 tablet (25 mg total) by mouth 2 (two) times daily. 180 tablet 3   omeprazole  (PRILOSEC) 20 MG capsule TAKE 1 CAPSULE BY MOUTH EVERY MORNING 30 MINUTES BEFORE BREAKFAST 90 capsule 3   fluticasone  (FLONASE ) 50 MCG/ACT nasal spray Place 2 sprays into both nostrils daily. 16 g 1   tadalafil  (CIALIS ) 10 MG tablet TAKE 1 TABLET BY MOUTH EVERY OTHER DAY AS NEEDED FOR ERECTILE DYSFUNCTION 10 tablet 1   Current Facility-Administered Medications  Medication Dose Route  Frequency Provider Last Rate Last Admin   0.9 %  sodium chloride  infusion  500 mL Intravenous Once Kenney Peacemaker, MD        Allergies as of 05/08/2024 - Review Complete 05/08/2024  Allergen Reaction Noted   Atenolol  Other (See Comments) 03/14/2014   Demerol Nausea And Vomiting 11/21/2011    Family History  Problem Relation Age of Onset   Hearing loss Mother    Diabetes Father    Colon cancer Neg Hx    Stomach cancer Neg Hx    Esophageal cancer Neg Hx    Rectal cancer Neg Hx     Social History   Socioeconomic History   Marital status: Married    Spouse name: Not on file   Number of children: 0   Years of education: Not on file   Highest education level: Some college, no degree  Occupational History   Occupation: retired  Tobacco Use   Smoking status: Former    Current packs/day: 0.50    Average packs/day: 0.5 packs/day for 15.0 years (7.5 ttl pk-yrs)    Types: Cigarettes   Smokeless tobacco: Never  Vaping Use   Vaping status: Never Used  Substance and Sexual Activity   Alcohol use: Not Currently    Alcohol/week: 13.0 standard drinks of alcohol    Types: 1 Glasses of wine, 12 Cans of beer per week   Drug use: No   Sexual activity: Yes    Birth control/protection: None  Other Topics Concern   Not on file  Social History Narrative   Married, no children.   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   No T/A/Ds.   Exercise: stationary bike/nautilus 5 days a week.         Social Drivers of Corporate investment banker Strain: Low Risk  (12/04/2023)   Overall Financial Resource Strain (CARDIA)    Difficulty of Paying Living Expenses: Not hard at all  Food Insecurity: No Food Insecurity (12/04/2023)   Hunger Vital Sign    Worried About Running Out of Food in the Last Year: Never true    Ran Out of Food in the Last Year: Never true  Transportation Needs: Patient Declined (12/04/2023)   PRAPARE - Transportation    Lack of Transportation (Medical): Patient  declined    Lack of Transportation (Non-Medical): Patient declined  Physical Activity: Sufficiently Active (12/04/2023)   Exercise Vital Sign    Days of Exercise per Week: 5 days    Minutes of Exercise per Session: 90 min  Stress: Stress Concern Present (12/04/2023)   [employer] of Occupational Health - Occupational Stress Questionnaire    Feeling of Stress : To some extent  Social Connections: Socially Isolated (12/04/2023)   Social Connection and Isolation Panel [NHANES]    Frequency of Communication with Friends and Family: Once a week    Frequency of Social Gatherings with Friends and Family: Once a week    Attends Religious Services: Never  Active Member of Clubs or Organizations: No    Attends Banker Meetings: Not on file    Marital Status: Married  Intimate Partner Violence: Not At Risk (10/20/2023)   Humiliation, Afraid, Rape, and Kick questionnaire    Fear of Current or Ex-Partner: No    Emotionally Abused: No    Physically Abused: No    Sexually Abused: No    Review of Systems:  All other review of systems negative except as mentioned in the HPI.  Physical Exam: Vital signs BP 136/67   Pulse (!) 44   Temp 99.1 F (37.3 C)   Resp 14   Ht 5\' 8"  (1.727 m)   Wt 185 lb (83.9 kg)   SpO2 96%   BMI 28.13 kg/m   General:   Alert,  Well-developed, well-nourished, pleasant and cooperative in NAD Lungs:  Clear throughout to auscultation.   Heart:  Regular rate and rhythm; no murmurs, clicks, rubs,  or gallops. Abdomen:  Soft, nontender and nondistended. Normal bowel sounds.   Neuro/Psych:  Alert and cooperative. Normal mood and affect. A and O x 3   @Nastasha Reising  Tammie Fall, MD, [facility] [phone] (pager) 05/08/2024 2:17 PM@

## 2024-05-08 ENCOUNTER — Ambulatory Visit (AMBULATORY_SURGERY_CENTER): Admitting: Internal Medicine

## 2024-05-08 ENCOUNTER — Encounter: Payer: Self-pay | Admitting: Internal Medicine

## 2024-05-08 VITALS — BP 100/52 | HR 56 | Temp 99.1°F | Resp 12 | Ht 68.0 in | Wt 185.0 lb

## 2024-05-08 DIAGNOSIS — I493 Ventricular premature depolarization: Secondary | ICD-10-CM | POA: Diagnosis not present

## 2024-05-08 DIAGNOSIS — K5730 Diverticulosis of large intestine without perforation or abscess without bleeding: Secondary | ICD-10-CM

## 2024-05-08 DIAGNOSIS — K648 Other hemorrhoids: Secondary | ICD-10-CM

## 2024-05-08 DIAGNOSIS — I10 Essential (primary) hypertension: Secondary | ICD-10-CM | POA: Diagnosis not present

## 2024-05-08 DIAGNOSIS — Z860102 Personal history of hyperplastic colon polyps: Secondary | ICD-10-CM | POA: Diagnosis not present

## 2024-05-08 DIAGNOSIS — Z1211 Encounter for screening for malignant neoplasm of colon: Secondary | ICD-10-CM

## 2024-05-08 DIAGNOSIS — I2510 Atherosclerotic heart disease of native coronary artery without angina pectoris: Secondary | ICD-10-CM | POA: Diagnosis not present

## 2024-05-08 MED ORDER — SODIUM CHLORIDE 0.9 % IV SOLN: 500.0000 mL | Freq: Once | INTRAVENOUS | Status: DC

## 2024-05-08 NOTE — Patient Instructions (Addendum)
 No polyps or cancer seen.  You do have diverticulosis - thickened muscle rings and pouches in the colon wall. Please read the handout about this condition.  Hemorrhoids also seen.   I appreciate the opportunity to care for you. Kenney Peacemaker, MD, FACG  YOU HAD AN ENDOSCOPIC PROCEDURE TODAY AT THE [facility]:   Refer to the procedure report that was given to you for any specific questions about what was found during the examination.  If the procedure report does not answer your questions, please call your gastroenterologist to clarify.  If you requested that your care partner not be given the details of your procedure findings, then the procedure report has been included in a sealed envelope for you to review at your convenience later.  YOU SHOULD EXPECT: Some feelings of bloating in the abdomen. Passage of more gas than usual.  Walking can help get rid of the air that was put into your GI tract during the procedure and reduce the bloating. If you had a lower endoscopy (such as a colonoscopy or flexible sigmoidoscopy) you may notice spotting of blood in your stool or on the toilet paper. If you underwent a bowel prep for your procedure, you may not have a normal bowel movement for a few days.  Please Note:  You might notice some irritation and congestion in your nose or some drainage.  This is from the oxygen used during your procedure.  There is no need for concern and it should clear up in a day or so.  SYMPTOMS TO REPORT IMMEDIATELY:  Following lower endoscopy (colonoscopy or flexible sigmoidoscopy):  Excessive amounts of blood in the stool  Significant tenderness or worsening of abdominal pains  Swelling of the abdomen that is new, acute  Fever of 100F or higher  For urgent or emergent issues, a gastroenterologist can be reached at any hour by calling [phone]. Do not use MyChart messaging for urgent concerns.    DIET:  We do recommend a small meal at first, but  then you may proceed to your regular diet.  Drink plenty of fluids but you should avoid alcoholic beverages for 24 hours.  ACTIVITY:  You should plan to take it easy for the rest of today and you should NOT DRIVE or use heavy machinery until tomorrow (because of the sedation medicines used during the test).    FOLLOW UP: Our staff will call the number listed on your records the next business day following your procedure.  We will call around 7:15- 8:00 am to check on you and address any questions or concerns that you may have regarding the information given to you following your procedure. If we do not reach you, we will leave a message.     If any biopsies were taken you will be contacted by phone or by letter within the next 1-3 weeks.  Please call us  at [phone] if you have not heard about the biopsies in 3 weeks.    SIGNATURES/CONFIDENTIALITY: You and/or your care partner have signed paperwork which will be entered into your electronic medical record.  These signatures attest to the fact that that the information above on your After Visit Summary has been reviewed and is understood.  Full responsibility of the confidentiality of this discharge information lies with you and/or your care-partner.

## 2024-05-08 NOTE — Op Note (Signed)
 [facility] Patient Name: Leroy Proctor Procedure Date: 05/08/2024 2:09 PM MRN: [id number] Endoscopist: Kenney Peacemaker , MD, [id number] Age: 73 Referring MD:  Date of Birth: 12/17/1950 Gender: Male Account #: [id number] Procedure:                Colonoscopy Indications:              Screening for colorectal malignant neoplasm, Last                            colonoscopy: 2014 Medicines:                Monitored Anesthesia Care Procedure:                Pre-Anesthesia Assessment:                           - Prior to the procedure, a History and Physical                            was performed, and patient medications and                            allergies were reviewed. The patient's tolerance of                            previous anesthesia was also reviewed. The risks                            and benefits of the procedure and the sedation                            options and risks were discussed with the patient.                            All questions were answered, and informed consent                            was obtained. Prior Anticoagulants: The patient has                            taken no anticoagulant or antiplatelet agents. ASA                            Grade Assessment: III - A patient with severe                            systemic disease. After reviewing the risks and                            benefits, the patient was deemed in satisfactory                            condition to undergo the procedure.  After obtaining informed consent, the colonoscope                            was passed under direct vision. Throughout the                            procedure, the patient's blood pressure, pulse, and                            oxygen saturations were monitored continuously. The                            Olympus Scope SN: [id number] was introduced through                            the anus and advanced to the the cecum,  identified                            by appendiceal orifice and ileocecal valve. The                            ileocecal valve, appendiceal orifice, and rectum                            were photographed. The bowel preparation used was                            SUPREP via split dose instruction. The colonoscopy                            was performed without difficulty. The patient                            tolerated the procedure well. The quality of the                            bowel preparation was good. Scope In: 2:20:58 PM Scope Out: 2:34:05 PM Scope Withdrawal Time: 0 hours 8 minutes 45 seconds  Total Procedure Duration: 0 hours 13 minutes 7 seconds  Findings:                 The perianal and digital rectal examinations were                            normal.                           Multiple diverticula were found in the sigmoid                            colon, descending colon and ascending colon.                           Internal hemorrhoids were found.  The exam was otherwise without abnormality on                            direct and retroflexion views. Complications:            No immediate complications. Estimated Blood Loss:     Estimated blood loss: none. Impression:               - Diverticulosis in the sigmoid colon, in the                            descending colon and in the ascending colon.                           - Internal hemorrhoids.                           - The examination was otherwise normal on direct                            and retroflexion views.                           - No specimens collected. Recommendation:           - Patient has a contact number available for                            emergencies. The signs and symptoms of potential                            delayed complications were discussed with the                            patient. Return to normal activities tomorrow.                             Written discharge instructions were provided to the                            patient.                           - Resume previous diet.                           - Continue present medications.                           - No repeat colonoscopy due to age and the absence                            of colonic polyps. Kenney Peacemaker, MD 05/08/2024 2:48:26 PM This report has been signed electronically.

## 2024-05-08 NOTE — Progress Notes (Signed)
 Pt's states no medical or surgical changes since previsit or office visit.

## 2024-05-08 NOTE — Progress Notes (Signed)
 Report to PACU, RN, vss, BBS= Clear.

## 2024-05-09 ENCOUNTER — Telehealth: Payer: Self-pay

## 2024-05-09 NOTE — Telephone Encounter (Signed)
 Left message

## 2024-05-14 ENCOUNTER — Telehealth: Payer: Self-pay

## 2024-05-14 ENCOUNTER — Ambulatory Visit

## 2024-05-14 NOTE — Telephone Encounter (Signed)
 Unsuccessful attempts to reach patient on preferred number listed in notes for scheduled AWV. Left message on voicemail okay to reschedule.

## 2024-06-05 ENCOUNTER — Other Ambulatory Visit: Payer: Self-pay | Admitting: Medical

## 2024-06-19 ENCOUNTER — Telehealth: Payer: Self-pay | Admitting: Medical

## 2024-06-19 NOTE — Telephone Encounter (Signed)
 Copied from CRM [id number]. Topic: Medicare AWV >> Jun 19, 2024  2:09 PM Nathanel DEL wrote: Reason for CRM: Called LVM 06/19/2024 to schedule AWV. Please schedule Virtual or Telehealth visits ONLY.   Nathanel Paschal; Care Guide Ambulatory Clinical Support [place] [facility] Direct Dial: [phone]

## 2024-06-21 ENCOUNTER — Ambulatory Visit: Admitting: Medical

## 2024-07-03 ENCOUNTER — Ambulatory Visit: Admitting: Medical

## 2024-07-03 ENCOUNTER — Other Ambulatory Visit: Payer: Self-pay | Admitting: Medical

## 2024-07-03 ENCOUNTER — Encounter: Payer: Self-pay | Admitting: Medical

## 2024-07-03 ENCOUNTER — Ambulatory Visit: Payer: Self-pay | Admitting: Medical

## 2024-07-03 VITALS — BP 110/70 | HR 60 | Resp 17 | Ht 68.0 in | Wt 191.0 lb

## 2024-07-03 DIAGNOSIS — I10 Essential (primary) hypertension: Secondary | ICD-10-CM

## 2024-07-03 DIAGNOSIS — R0789 Other chest pain: Secondary | ICD-10-CM

## 2024-07-03 DIAGNOSIS — I2510 Atherosclerotic heart disease of native coronary artery without angina pectoris: Secondary | ICD-10-CM | POA: Diagnosis not present

## 2024-07-03 DIAGNOSIS — Z7984 Long term (current) use of oral hypoglycemic drugs: Secondary | ICD-10-CM

## 2024-07-03 DIAGNOSIS — E119 Type 2 diabetes mellitus without complications: Secondary | ICD-10-CM

## 2024-07-03 LAB — COMPLETE METABOLIC PANEL WITHOUT GFR
AG Ratio: 1.3 (calc) (ref 1.0–2.5)
ALT: 24 U/L (ref 9–46)
AST: 22 U/L (ref 10–35)
Albumin: 4.2 g/dL (ref 3.6–5.1)
Alkaline phosphatase (APISO): 84 U/L (ref 35–144)
BUN: 22 mg/dL (ref 7–25)
CO2: 30 mmol/L (ref 20–32)
Calcium: 9.4 mg/dL (ref 8.6–10.3)
Chloride: 104 mmol/L (ref 98–110)
Creat: 1.11 mg/dL (ref 0.70–1.28)
Globulin: 3.2 g/dL (ref 1.9–3.7)
Glucose, Bld: 91 mg/dL (ref 65–99)
Potassium: 4.7 mmol/L (ref 3.5–5.3)
Sodium: 140 mmol/L (ref 135–146)
Total Bilirubin: 0.8 mg/dL (ref 0.2–1.2)
Total Protein: 7.4 g/dL (ref 6.1–8.1)

## 2024-07-03 LAB — TROPONIN I (HIGH SENSITIVITY): High Sens Troponin I: 13 ng/L (ref 2–17)

## 2024-07-03 LAB — HEMOGLOBIN A1C: Hgb A1c MFr Bld: 6.3 % (ref 4.6–6.5)

## 2024-07-03 NOTE — Addendum Note (Signed)
 Addended by: DORINA DALLAS HERO on: 07/03/2024 07:20 PM   Modules accepted: Orders

## 2024-07-03 NOTE — Patient Instructions (Addendum)
 Coronary artery disease, post-CABG with intermittent chest discomfort Intermittent chest discomfort possibly linked to recent Cialis  use. EKG shows bradycardia and left bundle branch block, consistent with previous EKGs. Risk of chest pain due to increased oxygen demand during exercise or medication interaction with Cialis ? Advised against Cialis  use due to potential interaction with nitroglycerin  in event that needs to be used in future, which could lead to hypotension in emergency scenarios. - Order EKG and blood work including A1c, metabolic panel, and troponin.(EKG showed lbbb, bradycardia and prolonged pr interval but not dropped beats. Same as prior EKG in 09-2023) - Refer to cardiologist Dr. Kimberly Ready for further evaluation.(Dr. Bernie consulted and agrees with plan) - Advise against taking Cialis  until further evaluation. - Advise against exercise to prevent increased oxygen demand. - Instruct to seek emergency care if chest pain is constant and not resolving.  Type 2 diabetes mellitus Borderline diabetes with A1c at the cutoff limit of 6.5%. - Order A1c test to monitor glucose control.  Bradycardia and left bundle branch block Bradycardia and left bundle branch block noted on EKG, consistent with previous findings. PR interval slightly prolonged but similar to past EKGs. Bradycardia likely related to regular exercise. - Monitor EKG findings and correlate with clinical symptoms.  Erectile dysfunction Erectile dysfunction managed with Cialis . Discomfort and increased sweating during exercise possibly linked to Cialis  use. Risk of interaction with nitroglycerin  if needed for chest pain, leading to potential hypotension. - Advise against taking Cialis  until further evaluation by cardiologist.  Referral to his cardiologist placed urgent today. Asked pt to call his cardiologist office tomorrow afternoon if no call by then  Follow up with me next tuesday or sooner if needed

## 2024-07-03 NOTE — Progress Notes (Signed)
 Subjective:    Patient ID: Leroy Proctor, male    DOB: 09-Nov-1951, 73 y.o.   MRN: [id number]  HPI  Leroy Proctor is a 73 year old male with coronary artery bypass grafting who presents with chest discomfort and increased sweating during exercise.  Approximately two weeks ago, he began experiencing chest discomfort and increased sweating during exercise(much more than typically). These symptoms were associated with the use of a medication for erectile dysfunction, which he took for five consecutive days, half a pill each day. The discomfort was described as a low-level pressure, particularly noticeable during workouts, and was accompanied by unusual sweating despite maintaining the same level of physical activity. He discontinued the medication, and the symptoms subsided within a couple of days.  Pt reports no recurrent chest pain as he had 2 weeks ago.  He underwent coronary artery bypass grafting in November of the previous year. Since the surgery, he has experienced some lingering discomfort, which he describes as being more noticeable when sitting in certain positions. He has not experienced significant discomfort until the recent episode two weeks ago.  Pain level 2 weeks ago was worse than it was post surgery. Pt explains this.  He has a history of borderline diabetes, with a recent A1c of 6.5. During the episode of chest discomfort, he monitored his blood pressure frequently, noting that it remained consistent with his usual readings. No persistent chest pain or significant changes in blood pressure during the episode    Review of Systems  Constitutional:  Negative for chills, fatigue and fever.  HENT:  Negative for congestion.   Respiratory:  Negative for cough, chest tightness, wheezing and stridor.   Cardiovascular:  Negative for chest pain and palpitations.       See hpi.  Gastrointestinal:  Negative for abdominal pain, constipation, nausea and vomiting.  Genitourinary:   Negative for dysuria and frequency.  Musculoskeletal:  Negative for back pain and myalgias.  Skin:  Negative for rash.  Neurological:  Negative for dizziness, weakness and numbness.  Hematological:  Negative for adenopathy. Does not bruise/bleed easily.  Psychiatric/Behavioral:  Negative for behavioral problems and dysphoric mood. The patient is not nervous/anxious.     Past Medical History:  Diagnosis Date   ABLA (acute blood loss anemia) 10/12/2023   Allergy    Arthritis    Bradycardia 10/02/2013   CAD, triple-vessel disease with calcium  score 1624, CAD RADS 4 study, severe stenosis in LAD, moderate stenosis in LCx and RCA, hemodynamically significant by CT FFR in all 3 territories 10/05/2023   Cataract    Chest pain of uncertain etiology 09/19/2023   Chronic renal insufficiency, stage II (mild)    CrCl 60s   Cold sore 06/03/2014   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   Endotracheally intubated 10/12/2023   Erectile dysfunction    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  Currently sinus rhythm 10/30/2023   Prediabetes 10/11/2023   Primary hypercholesterolemia 10/11/2023  Progressive angina (HCC) 10/10/2023   Pulmonary nodules, small 3 mm on CT coronary imaging 09/2023; history of smoking, recommend 65-month follow-up 10/30/2023   Recurrent herpes labialis    Routine general medical examination at a health care facility 10/02/2013   S/P  CABG x 3 10/12/2023     Social History   Socioeconomic History   Marital status: Married    Spouse name: Not on file   Number of children: 0   Years of education: Not on file   Highest education level: Some college, no degree  Occupational History   Occupation: retired  Tobacco Use   Smoking status: Former    Current packs/day: 0.50    Average packs/day: 0.5 packs/day for 15.0 years (7.5 ttl pk-yrs)    Types: Cigarettes   Smokeless tobacco: Never  Vaping Use   Vaping status: Never Used  Substance and Sexual Activity   Alcohol use: Not Currently    Alcohol/week: 13.0 standard drinks of alcohol    Types: 1 Glasses of wine, 12 Cans of beer per week   Drug use: No   Sexual activity: Yes    Birth control/protection: None  Other Topics Concern   Not on file  Social History Narrative   Married, no children.   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   No T/A/Ds.   Exercise: stationary bike/nautilus 5 days a week.         Social Drivers of Corporate investment banker Strain: Low Risk  (07/02/2024)   Overall Financial Resource Strain (CARDIA)    Difficulty of Paying Living Expenses: Not hard at all  Food Insecurity: No Food Insecurity (07/02/2024)   Hunger Vital Sign    Worried About Running Out of Food in the Last Year: Never true    Ran Out of Food in the Last Year: Never true  Transportation Needs: Patient Declined (07/02/2024)   PRAPARE - Transportation    Lack of Transportation (Medical): Patient declined    Lack of Transportation (Non-Medical): Patient declined  Physical Activity: Sufficiently Active (07/02/2024)   Exercise Vital Sign    Days of Exercise per Week: 5 days    Minutes of Exercise per Session: 80 min  Stress: No Stress Concern Present (07/02/2024)   [employer] of Occupational Health - Occupational Stress Questionnaire    Feeling of Stress: Only a little  Social Connections: Socially Isolated (07/02/2024)   Social Connection and Isolation Panel     Frequency of Communication with Friends and Family: Once a week    Frequency of Social Gatherings with Friends and Family: Once a week    Attends Religious Services: Never    Database administrator or Organizations: No    Attends Engineer, structural: Not on file    Marital Status: Married  Catering manager Violence: Not At Risk (10/20/2023)   Humiliation, Afraid, Rape, and Kick questionnaire    Fear of Current or Ex-Partner: No    Emotionally Abused: No    Physically Abused: No    Sexually Abused: No    Past Surgical History:  Procedure Laterality Date   COLONOSCOPY  2002; 2014   Repeat 2024   CORONARY ARTERY BYPASS GRAFT N/A 10/12/2023   Procedure: CORONARY ARTERY BYPASS GRAFTING (CABG) X THREE, USING LEFT INTERNAL MAMMARY ARTERY AND RIGHT GREATER SAPHENEOUS VEIN HARVESTED ENDOSCOPICLY;  Surgeon: Shyrl Linnie KIDD, MD;  Location: [facility] OR;  Service: Open Heart Surgery;  Laterality: N/A;   LEFT HEART CATH AND CORONARY ANGIOGRAPHY  N/A 10/10/2023   Procedure: LEFT HEART CATH AND CORONARY ANGIOGRAPHY;  Surgeon: Anner Alm ORN, MD;  Location: [facility] INVASIVE CV LAB;  Service: Cardiovascular;  Laterality: N/A;   LITHOTRIPSY     TEE WITHOUT CARDIOVERSION N/A 10/12/2023   Procedure: TRANSESOPHAGEAL ECHOCARDIOGRAM;  Surgeon: Shyrl Linnie KIDD, MD;  Location: [facility] OR;  Service: Open Heart Surgery;  Laterality: N/A;    Family History  Problem Relation Age of Onset   Hearing loss Mother    Diabetes Father    Colon cancer Neg Hx    Stomach cancer Neg Hx    Esophageal cancer Neg Hx    Rectal cancer Neg Hx     Allergies  Allergen Reactions   Atenolol  Other (See Comments)    bradycarda- heart rate down to 38 bpm   Demerol Nausea And Vomiting    Current Outpatient Medications on File Prior to Visit  Medication Sig Dispense Refill   amiodarone  (PACERONE ) 200 MG tablet Take 1 tablet (200 mg total) by mouth daily. 18 tablet 0   amLODipine  (NORVASC ) 10 MG tablet TAKE 1 TABLET BY  MOUTH DAILY 90 tablet 3   aspirin  EC 325 MG tablet Take 1 tablet (325 mg total) by mouth daily.     atorvastatin  (LIPITOR) 80 MG tablet TAKE 1 TABLET BY MOUTH DAILY 90 tablet 0   dapagliflozin  propanediol (FARXIGA ) 5 MG TABS tablet Take 1 tablet (5 mg total) by mouth daily before breakfast. 90 tablet 3   fluticasone  (FLONASE ) 50 MCG/ACT nasal spray Place 2 sprays into both nostrils daily. 16 g 1   losartan  (COZAAR ) 25 MG tablet TAKE 1 TABLET BY MOUTH DAILY 90 tablet 0   metoprolol  tartrate (LOPRESSOR ) 25 MG tablet Take 1 tablet (25 mg total) by mouth 2 (two) times daily. 180 tablet 3   tadalafil  (CIALIS ) 10 MG tablet TAKE 1 TABLET BY MOUTH EVERY OTHER DAY AS NEEDED FOR ERECTILE DYSFUNCTION 10 tablet 1   No current facility-administered medications on file prior to visit.    BP 110/70   Pulse 60   Resp 17   Ht 5' 8 (1.727 m)   Wt 191 lb (86.6 kg)   SpO2 97%   BMI 29.04 kg/m        Objective:   Physical Exam  General Mental Status- Alert. General Appearance- Not in acute distress.   Skin General: Color- Normal Color. Moisture- Normal Moisture.  Neck No carotid bruits. No JVD.  Chest and Lung Exam Auscultation: Breath Sounds:-CTA  Cardiovascular Auscultation:Rythm- RRR Murmurs & Other Heart Sounds:Auscultation of the heart reveals- No Murmurs.  Abdomen Inspection:-Inspeection Normal. Palpation/Percussion:Note:No mass. Palpation and Percussion of the abdomen reveal- Non Tender, Non Distended + BS, no rebound or guarding.   Neurologic Cranial Nerve exam:- CN III-XII intact(No nystagmus), symmetric smile. Strength:- 5/5 equal and symmetric strength both upper and lower extremities.       Assessment & Plan:   Patient Instructions  Coronary artery disease, post-CABG with intermittent chest discomfort Intermittent chest discomfort possibly linked to recent Cialis  use. EKG shows bradycardia and left bundle branch block, consistent with previous EKGs. Risk of chest  pain due to increased oxygen demand during exercise or medication interaction with Cialis ? Advised against Cialis  use due to potential interaction with nitroglycerin  in event that needs to be used in future, which could lead to hypotension in emergency scenarios. - Order EKG and blood work including A1c, metabolic panel, and troponin.(EKG showed lbbb, bradycardia and prolonged pr interval but not dropped beats. Same as  prior EKG in 09-2023) - Refer to cardiologist Dr. Kimberly Ready for further evaluation.(Dr. Bernie consulted and agrees with plan) - Advise against taking Cialis  until further evaluation. - Advise against exercise to prevent increased oxygen demand. - Instruct to seek emergency care if chest pain is constant and not resolving.  Type 2 diabetes mellitus Borderline diabetes with A1c at the cutoff limit of 6.5%. - Order A1c test to monitor glucose control.  Bradycardia and left bundle branch block Bradycardia and left bundle branch block noted on EKG, consistent with previous findings. PR interval slightly prolonged but similar to past EKGs. Bradycardia likely related to regular exercise. - Monitor EKG findings and correlate with clinical symptoms.  Erectile dysfunction Erectile dysfunction managed with Cialis . Discomfort and increased sweating during exercise possibly linked to Cialis  use. Risk of interaction with nitroglycerin  if needed for chest pain, leading to potential hypotension. - Advise against taking Cialis  until further evaluation by cardiologist.  Referral to his cardiologist placed urgent today. Asked pt to call his cardiologist office tomorrow afternoon if no call by then  Follow up with me next tuesday or sooner if needed   [name], PA-C   Time spent with patient today was  50 minutes which consisted of chart review, discussing diagnosis, work up treatment and documentation.

## 2024-07-07 ENCOUNTER — Other Ambulatory Visit: Payer: Self-pay | Admitting: Medical

## 2024-07-09 ENCOUNTER — Ambulatory Visit

## 2024-07-09 ENCOUNTER — Ambulatory Visit: Attending: Cardiology | Admitting: Cardiology

## 2024-07-09 VITALS — BP 108/58 | HR 46 | Ht 68.0 in | Wt 190.0 lb

## 2024-07-09 DIAGNOSIS — I447 Left bundle-branch block, unspecified: Secondary | ICD-10-CM

## 2024-07-09 DIAGNOSIS — E7800 Pure hypercholesterolemia, unspecified: Secondary | ICD-10-CM

## 2024-07-09 DIAGNOSIS — I10 Essential (primary) hypertension: Secondary | ICD-10-CM

## 2024-07-09 DIAGNOSIS — I25118 Atherosclerotic heart disease of native coronary artery with other forms of angina pectoris: Secondary | ICD-10-CM | POA: Diagnosis not present

## 2024-07-09 DIAGNOSIS — Z951 Presence of aortocoronary bypass graft: Secondary | ICD-10-CM

## 2024-07-09 DIAGNOSIS — I480 Paroxysmal atrial fibrillation: Secondary | ICD-10-CM

## 2024-07-09 DIAGNOSIS — I219 Acute myocardial infarction, unspecified: Secondary | ICD-10-CM | POA: Insufficient documentation

## 2024-07-09 NOTE — Progress Notes (Signed)
 Cardiology Office Note:    Date:  07/09/2024   ID:  Leroy Proctor, DOB Dec 05, 1951, MRN [id number]  PCP:  Leroy Loving, PA-C  Cardiologist:  Leroy JONELLE Crape, MD   Referring MD: Leroy Loving, PA-C    ASSESSMENT:    1. Coronary artery disease of native artery of native heart with stable angina pectoris (HCC)   2. Primary hypertension   3. Left bundle branch block   4. Paroxysmal atrial fibrillation (HCC)   5. S/P CABG x 3   6. Primary hypercholesterolemia    PLAN:    In order of problems listed above:  Coronary artery disease: Secondary prevention stressed with the patient.  Importance of compliance with diet medication stressed and patient verbalized standing.  He has excellent effort tolerance Essential hypertension and sinus bradycardia: Following recommendations were made.  He also has left bundle branch block.  Patient also has first-degree AV block.  I reduced beta-blocker from tartrate to succinate and only 25 mg daily.  I cut down amiodarone  therapy to 100 mg daily.  He will keep a track of his pulse blood pressures.  Hopefully this will also help his blood pressure which is borderline. Postop atrial fibrillation: Asymptomatic at this time.  Occasional possible palpitations.  Will do a 2-week monitor to assess this.  This will be followed by my partner in 6 weeks based on the results of these tests. Mixed dyslipidemia: On lipid-lowering medications.  Back in the next few days for blood work.  Also because he is on amiodarone  we will also do LFTs and TSH. Patient will be seen in follow-up appointment in 6 weeks by my partner or earlier if the patient has any concerns.    Medication Adjustments/Labs and Tests Ordered: Current medicines are reviewed at length with the patient today.  Concerns regarding medicines are outlined above.  Orders Placed This Encounter  Procedures   EKG 12-Lead   No orders of the defined types were placed in this encounter.    No chief  complaint on file.    History of Present Illness:    Leroy Proctor is a 73 y.o. male.  Patient has past medical history of coronary artery disease post CABG surgery, essential hypertension, mixed dyslipidemia.  He had postop paroxysmal atrial fibrillation.  He sees my partner on a regular basis.  He denies any chest pain orthopnea or PND.  At the time of my evaluation, the patient is alert awake oriented and in no distress.  Past Medical History:  Diagnosis Date   ABLA (acute blood loss anemia) 10/12/2023   Allergy    Arthritis    Bradycardia 10/02/2013   CAD, triple-vessel disease with calcium  score 1624, CAD RADS 4 study, severe stenosis in LAD, moderate stenosis in LCx and RCA, hemodynamically significant by CT FFR in all 3 territories 10/05/2023   Cataract    Chest pain of uncertain etiology 09/19/2023   Chronic renal insufficiency, stage II (mild)    CrCl 60s   Cold sore 06/03/2014   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   Endotracheally intubated 10/12/2023   Erectile dysfunction    GERD (gastroesophageal reflux disease)    Health maintenance examination 10/16/2014   Herpes zoster    R side of face   HTN (hypertension) 11/21/2011   Hyperlipidemia    Impaired fasting glucose 2013-2015   Kidney stones    Left bundle branch block 09/19/2023   Mild mitral regurgitation noted on IntraOp TEE 10/12/2023 10/30/2023  Myocardial infarction [facility])    Personal history of kidney stones    Postop atrial fibrillation after CABG 10/12/2023, started on amiodarone .  Currently sinus rhythm 10/30/2023   Prediabetes 10/11/2023   Primary hypercholesterolemia 10/11/2023   Progressive angina (HCC) 10/10/2023   Pulmonary nodules, small 3 mm on CT coronary imaging 09/2023; history of smoking, recommend 29-month follow-up 10/30/2023   Recurrent herpes labialis    Routine general medical examination at a health care facility 10/02/2013   S/P  CABG x 3 10/12/2023    Past Surgical History:  Procedure Laterality Date   COLONOSCOPY  2002; 2014   Repeat 2024   CORONARY ARTERY BYPASS GRAFT N/A 10/12/2023   Procedure: CORONARY ARTERY BYPASS GRAFTING (CABG) X THREE, USING LEFT INTERNAL MAMMARY ARTERY AND RIGHT GREATER SAPHENEOUS VEIN HARVESTED ENDOSCOPICLY;  Surgeon: Leroy Linnie KIDD, MD;  Location: [facility] OR;  Service: Open Heart Surgery;  Laterality: N/A;   LEFT HEART CATH AND CORONARY ANGIOGRAPHY N/A 10/10/2023   Procedure: LEFT HEART CATH AND CORONARY ANGIOGRAPHY;  Surgeon: Leroy Alm ORN, MD;  Location: [facility] INVASIVE CV LAB;  Service: Cardiovascular;  Laterality: N/A;   LITHOTRIPSY     TEE WITHOUT CARDIOVERSION N/A 10/12/2023   Procedure: TRANSESOPHAGEAL ECHOCARDIOGRAM;  Surgeon: Leroy Linnie KIDD, MD;  Location: [facility] OR;  Service: Open Heart Surgery;  Laterality: N/A;    Current Medications: Current Meds  Medication Sig   amiodarone  (PACERONE ) 200 MG tablet Take 1 tablet (200 mg total) by mouth daily.   amLODipine  (NORVASC ) 10 MG tablet TAKE 1 TABLET BY MOUTH DAILY   aspirin  EC 325 MG tablet Take 1 tablet (325 mg total) by mouth daily.   atorvastatin  (LIPITOR) 80 MG tablet TAKE 1 TABLET BY MOUTH DAILY   cetirizine (ZYRTEC) 10 MG tablet Take 10 mg by mouth as needed for allergies.   dapagliflozin  propanediol (FARXIGA ) 5 MG TABS tablet Take 1 tablet (5 mg total) by mouth daily before breakfast.   fluticasone  (FLONASE ) 50 MCG/ACT nasal spray Place 2 sprays into both nostrils daily.   losartan  (COZAAR ) 25 MG tablet TAKE 1 TABLET BY MOUTH DAILY   metoprolol  tartrate (LOPRESSOR ) 25 MG tablet Take 1 tablet (25 mg total) by mouth 2 (two) times daily.   omeprazole  (PRILOSEC) 20 MG capsule TAKE 1 CAPSULE BY MOUTH EVERY MORNING 30 MINUTES BEFORE BREAKFAST   tadalafil  (CIALIS ) 10 MG tablet TAKE 1 TABLET BY MOUTH EVERY OTHER DAY AS NEEDED FOR ERECTILE DYSFUNCTION     Allergies:   Atenolol  and Demerol   Social History   Socioeconomic  History   Marital status: Married    Spouse name: Not on file   Number of children: 0   Years of education: Not on file   Highest education level: Some college, no degree  Occupational History   Occupation: retired  Tobacco Use   Smoking status: Former    Current packs/day: 0.50    Average packs/day: 0.5 packs/day for 15.0 years (7.5 ttl pk-yrs)    Types: Cigarettes   Smokeless tobacco: Never  Vaping Use   Vaping status: Never Used  Substance and Sexual Activity   Alcohol use: Not Currently    Alcohol/week: 13.0 standard drinks of alcohol    Types: 1 Glasses of wine, 12 Cans of beer per week   Drug use: No   Sexual activity: Yes    Birth control/protection: None  Other Topics Concern   Not on file  Social History Narrative   Married, no children.   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   No T/A/Ds.   Exercise: stationary bike/nautilus 5 days a week.         Social Drivers of Corporate investment banker Strain: Low Risk  (07/02/2024)   Overall Financial Resource Strain (CARDIA)    Difficulty of Paying Living Expenses: Not hard at all  Food Insecurity: No Food Insecurity (07/02/2024)   Hunger Vital Sign    Worried About Running Out of Food in the Last Year: Never true    Ran Out of Food in the Last Year: Never true  Transportation Needs: Patient Declined (07/02/2024)   PRAPARE - Transportation    Lack of Transportation (Medical): Patient declined    Lack of Transportation (Non-Medical): Patient declined  Physical Activity: Sufficiently Active (07/02/2024)   Exercise Vital Sign    Days of Exercise per Week: 5 days    Minutes of Exercise per Session: 80 min  Stress: No Stress Concern Present (07/02/2024)   [employer] of Occupational Health - Occupational Stress Questionnaire    Feeling of Stress: Only a little  Social Connections: Socially Isolated (07/02/2024)   Social Connection and Isolation Panel    Frequency of Communication with Friends and Family: Once  a week    Frequency of Social Gatherings with Friends and Family: Once a week    Attends Religious Services: Never    Database administrator or Organizations: No    Attends Engineer, structural: Not on file    Marital Status: Married     Family History: The patient's family history includes Diabetes in his father; Hearing loss in his mother. There is no history of Colon cancer, Stomach cancer, Esophageal cancer, or Rectal cancer.  ROS:   Please see the history of present illness.    All other systems reviewed and are negative.  EKGs/Labs/Other Studies Reviewed:    The following studies were reviewed today: .SABRAEKG Interpretation Date/Time:  Tuesday July 09 2024 10:00:37 EDT Ventricular Rate:  46 PR Interval:  272 QRS Duration:  152 QT Interval:  484 QTC Calculation: 423 R Axis:   133  Text Interpretation: Sinus bradycardia with 1st degree A-V block Right axis deviation Left bundle branch block When compared with ECG of 07-Feb-2024 14:02, Left bundle branch block is now Present Confirmed by Edwyna Backers [phone]) on 07/09/2024 10:11:22 AM     Recent Labs: 10/15/2023: Hemoglobin 10.2; Platelets 176 10/16/2023: Magnesium  1.9 07/03/2024: ALT 24; BUN 22; Creat 1.11; Potassium 4.7; Sodium 140  Recent Lipid Panel    Component Value Date/Time   CHOL 149 10/11/2023 0908   TRIG 38 10/11/2023 0908   HDL 48 10/11/2023 0908   CHOLHDL 3.1 10/11/2023 0908   VLDL 8 10/11/2023 0908   LDLCALC 93 10/11/2023 0908    Physical Exam:    VS:  BP (!) 108/58   Pulse (!) 46   Ht 5' 8 (1.727 m)   Wt 190 lb (86.2 kg)   SpO2 93%   BMI 28.89 kg/m     Wt Readings from Last 3 Encounters:  07/09/24 190 lb (86.2 kg)  07/03/24 191 lb (86.6 kg)  05/08/24 185 lb (83.9 kg)     GEN: Patient is in no acute distress HEENT: Normal NECK: No JVD; No carotid bruits LYMPHATICS: No lymphadenopathy CARDIAC: Hear sounds regular, 2/6 systolic murmur at the apex. RESPIRATORY:  Clear to  auscultation without rales, wheezing or rhonchi  ABDOMEN: Soft, non-tender, non-distended MUSCULOSKELETAL:  No edema; No deformity  SKIN: Warm and dry  NEUROLOGIC:  Alert and oriented x 3 PSYCHIATRIC:  Normal affect   Signed, Leroy JONELLE Crape, MD  07/09/2024 10:13 AM    [facility]

## 2024-07-09 NOTE — Patient Instructions (Addendum)
 Medication Instructions:  Your physician recommends that you continue on your current medications as directed. Please refer to the Current Medication list given to you today.  *If you need a refill on your cardiac medications before your next appointment, please call your pharmacy*   Lab Work: Your physician recommends that you return for lab work in: the next few days for CMP, TSH and lipids. You need to have labs done when you are fasting. [facility] is located on the 3rd floor, [address]. Hours are Monday - Thursday 8 am to 4 pm, closed 11:30 am to 1:00 pm. You do NOT need an appointment.   If you have labs (blood work) drawn today and your tests are completely normal, you will receive your results only by: MyChart Message (if you have MyChart) OR A paper copy in the mail If you have any lab test that is abnormal or we need to change your treatment, we will call you to review the results.   Testing/Procedures: A zio monitor was ordered today. It will remain on for 14 days. Remove 07/23/24. You will then return monitor and event diary in provided box. It takes 1-2 weeks for report to be downloaded and returned to us . We will call you with the results. If monitor falls off or has orange flashing light, please call Zio for further instructions.  Follow-Up: At [facility], you and your health needs are our priority.  As part of our continuing mission to provide you with exceptional heart care, we have created designated Provider Care Teams.  These Care Teams include your primary Cardiologist (physician) and Advanced Practice Providers (APPs -  Physician Assistants and Nurse Practitioners) who all work together to provide you with the care you need, when you need it.  We recommend signing up for the patient portal called MyChart.  Sign up information is provided on this After Visit Summary.  MyChart is used to connect with patients for Virtual Visits (Telemedicine).  Patients are  able to view lab/test results, encounter notes, upcoming appointments, etc.  Non-urgent messages can be sent to your provider as well.   To learn more about what you can do with MyChart, go to [email or web address].    Your next appointment:   6 month(s)  The format for your next appointment:   In Person  Provider:   Alean Kobus, MD    Other Instructions none  Important Information About Sugar

## 2024-07-10 DIAGNOSIS — Z951 Presence of aortocoronary bypass graft: Secondary | ICD-10-CM | POA: Diagnosis not present

## 2024-07-10 DIAGNOSIS — I25118 Atherosclerotic heart disease of native coronary artery with other forms of angina pectoris: Secondary | ICD-10-CM | POA: Diagnosis not present

## 2024-07-10 DIAGNOSIS — I10 Essential (primary) hypertension: Secondary | ICD-10-CM | POA: Diagnosis not present

## 2024-07-11 ENCOUNTER — Ambulatory Visit: Payer: Self-pay | Admitting: Cardiology

## 2024-07-11 LAB — LIPID PANEL
Chol/HDL Ratio: 3.7 ratio (ref 0.0–5.0)
Cholesterol, Total: 134 mg/dL (ref 100–199)
HDL: 36 mg/dL — ABNORMAL LOW (ref >39)
LDL Chol Calc (NIH): 84 mg/dL (ref 0–99)
Triglycerides: 66 mg/dL (ref 0–149)
VLDL Cholesterol Cal: 14 mg/dL (ref 5–40)

## 2024-07-11 LAB — COMPREHENSIVE METABOLIC PANEL WITH GFR
ALT: 22 IU/L (ref 0–44)
AST: 19 IU/L (ref 0–40)
Albumin: 4.1 g/dL (ref 3.8–4.8)
Alkaline Phosphatase: 92 IU/L (ref 44–121)
BUN/Creatinine Ratio: 16 (ref 10–24)
BUN: 17 mg/dL (ref 8–27)
Bilirubin Total: 0.7 mg/dL (ref 0.0–1.2)
CO2: 23 mmol/L (ref 20–29)
Calcium: 9 mg/dL (ref 8.6–10.2)
Chloride: 101 mmol/L (ref 96–106)
Creatinine, Ser: 1.06 mg/dL (ref 0.76–1.27)
Globulin, Total: 2.7 g/dL (ref 1.5–4.5)
Glucose: 91 mg/dL (ref 70–99)
Potassium: 5.1 mmol/L (ref 3.5–5.2)
Sodium: 138 mmol/L (ref 134–144)
Total Protein: 6.8 g/dL (ref 6.0–8.5)
eGFR: 74 mL/min/1.73 (ref >59)

## 2024-07-11 LAB — TSH: TSH: 2.09 u[IU]/mL (ref 0.450–4.500)

## 2024-07-25 DIAGNOSIS — I480 Paroxysmal atrial fibrillation: Secondary | ICD-10-CM | POA: Diagnosis not present

## 2024-08-02 DIAGNOSIS — I480 Paroxysmal atrial fibrillation: Secondary | ICD-10-CM | POA: Diagnosis not present

## 2024-08-20 ENCOUNTER — Telehealth: Payer: Self-pay

## 2024-08-20 ENCOUNTER — Ambulatory Visit

## 2024-08-20 NOTE — Telephone Encounter (Signed)
 Unsuccessful attempts to reach patient on preferred number listed in notes for scheduled AWV. Left message on voicemail okay to reschedule.

## 2024-09-05 ENCOUNTER — Other Ambulatory Visit: Payer: Self-pay | Admitting: Medical

## 2024-09-12 ENCOUNTER — Ambulatory Visit: Payer: Self-pay | Admitting: Medical

## 2024-09-12 ENCOUNTER — Other Ambulatory Visit (HOSPITAL_BASED_OUTPATIENT_CLINIC_OR_DEPARTMENT_OTHER): Payer: Self-pay

## 2024-09-12 ENCOUNTER — Other Ambulatory Visit: Payer: Self-pay | Admitting: Cardiology

## 2024-09-12 ENCOUNTER — Telehealth: Payer: Self-pay | Admitting: *Deleted

## 2024-09-12 ENCOUNTER — Other Ambulatory Visit

## 2024-09-12 ENCOUNTER — Ambulatory Visit: Admitting: *Deleted

## 2024-09-12 VITALS — BP 156/68 | HR 51 | Temp 97.9°F | Resp 16 | Ht 68.0 in | Wt 194.2 lb

## 2024-09-12 DIAGNOSIS — E119 Type 2 diabetes mellitus without complications: Secondary | ICD-10-CM | POA: Diagnosis not present

## 2024-09-12 DIAGNOSIS — Z23 Encounter for immunization: Secondary | ICD-10-CM

## 2024-09-12 DIAGNOSIS — Z0000 Encounter for general adult medical examination without abnormal findings: Secondary | ICD-10-CM | POA: Diagnosis not present

## 2024-09-12 LAB — MICROALBUMIN / CREATININE URINE RATIO
Creatinine,U: 20.1 mg/dL
Microalb Creat Ratio: UNDETERMINED mg/g (ref 0.0–30.0)
Microalb, Ur: <0.7 mg/dL

## 2024-09-12 MED ORDER — AMIODARONE HCL 200 MG PO TABS: 200.0000 mg | ORAL_TABLET | Freq: Every day | ORAL | 0 refills | Status: AC

## 2024-09-12 MED ORDER — DAPAGLIFLOZIN PROPANEDIOL 5 MG PO TABS: 5.0000 mg | ORAL_TABLET | Freq: Every day | ORAL | 3 refills | Status: AC

## 2024-09-12 MED ORDER — COMIRNATY 30 MCG/0.3ML IM SUSY
0.3000 mL | PREFILLED_SYRINGE | Freq: Once | INTRAMUSCULAR | 0 refills | Status: AC
  Filled 2024-09-12: qty 0.3, 1d supply, fill #0

## 2024-09-12 NOTE — Telephone Encounter (Signed)
 This Pt had this RX prescribed by his PCP. Is this something that you want to refill? Please advise

## 2024-09-12 NOTE — Telephone Encounter (Signed)
Pt called and aware

## 2024-09-12 NOTE — Telephone Encounter (Signed)
 No Monday available pt scheduled for Tuesday at 2pm

## 2024-09-12 NOTE — Telephone Encounter (Signed)
*  STAT* If patient is at the pharmacy, call can be transferred to refill team.   1. Which medications need to be refilled? (please list name of each medication and dose if known)   amiodarone  (PACERONE ) 200 MG tablet  dapagliflozin  propanediol (FARXIGA ) 5 MG TABS tablet   2. Would you like to learn more about the convenience, safety, & potential cost savings by using the [facility]?   3. Are you open to using the [facility] (Type [facility]. ).  4. Which pharmacy/location (including street and city if local pharmacy) is medication to be sent to?  [facility] [id number] - [place], [place] - [address]   5. Do they need a 30 day or 90 day supply?   90 day  Patient stated he is completely out of these medications.

## 2024-09-12 NOTE — Progress Notes (Signed)
 Subjective:   Leroy Proctor is a 73 y.o. who presents for a Medicare Wellness preventive visit.  As a reminder, Annual Wellness Visits don't include a physical exam, and some assessments may be limited, especially if this visit is performed virtually. We may recommend an in-person follow-up visit with your provider if needed.  Visit Complete: In person  Persons Participating in Visit: Patient.  AWV Questionnaire: Yes: Patient Medicare AWV questionnaire was completed by the patient on 09/11/24; I have confirmed that all information answered by patient is correct and no changes since this date.  Cardiac Risk Factors include: advanced age (>75men, >44 women);male gender;hypertension;dyslipidemia;diabetes mellitus;Other (see comment), Risk factor comments: CKD, CAD     Objective:    Today's Vitals   09/12/24 0815 09/12/24 0838  BP: (!) 161/83 (!) 156/68  Pulse: (!) 51   Resp: 16   Temp: 97.9 F (36.6 C)   TempSrc: Oral   SpO2: 97%   Weight: 194 lb 3.2 oz (88.1 kg)   Height: 5' 8 (1.727 m)    Body mass index is 29.53 kg/m.     09/12/2024    8:24 AM 10/12/2023    6:39 AM 10/10/2023    4:58 PM 10/10/2023   10:30 AM 09/08/2023   11:06 AM 05/11/2023    3:39 PM 04/18/2022    1:02 PM  Advanced Directives  Does Patient Have a Medical Advance Directive? Yes Yes Yes Yes No Yes Yes  Type of Estate agent of [place];Living will Healthcare Power of [employer] Power of [employer] Power of [employer] Power of [place];Living will Healthcare Power of [place];Out of facility DNR (pink MOST or yellow form);Living will  Does patient want to make changes to medical advance directive? No - Patient declined No - Patient declined No - Patient declined No - Patient declined     Copy of Healthcare Power of Attorney in Chart? No - copy requested No - copy requested No - copy requested   No - copy requested No - copy requested  Would patient like  information on creating a medical advance directive?     No - Patient declined      Current Medications (verified) Outpatient Encounter Medications as of 09/12/2024  Medication Sig   amiodarone  (PACERONE ) 200 MG tablet Take 1 tablet (200 mg total) by mouth daily.   amLODipine  (NORVASC ) 10 MG tablet TAKE 1 TABLET BY MOUTH DAILY   aspirin  EC 325 MG tablet Take 1 tablet (325 mg total) by mouth daily.   atorvastatin  (LIPITOR) 80 MG tablet TAKE 1 TABLET BY MOUTH DAILY   cetirizine (ZYRTEC) 10 MG tablet Take 10 mg by mouth as needed for allergies.   dapagliflozin  propanediol (FARXIGA ) 5 MG TABS tablet Take 1 tablet (5 mg total) by mouth daily before breakfast.   fluticasone  (FLONASE ) 50 MCG/ACT nasal spray Place 2 sprays into both nostrils daily.   losartan  (COZAAR ) 25 MG tablet TAKE 1 TABLET BY MOUTH DAILY   metoprolol  tartrate (LOPRESSOR ) 25 MG tablet Take 1 tablet (25 mg total) by mouth 2 (two) times daily.   omeprazole  (PRILOSEC) 20 MG capsule TAKE 1 CAPSULE BY MOUTH EVERY MORNING 30 MINUTES BEFORE BREAKFAST   tadalafil  (CIALIS ) 10 MG tablet TAKE 1 TABLET BY MOUTH EVERY OTHER DAY AS NEEDED FOR ERECTILE DYSFUNCTION   No facility-administered encounter medications on file as of 09/12/2024.    Allergies (verified) Atenolol  and Demerol   History: Past Medical History:  Diagnosis Date   ABLA (  acute blood loss anemia) 10/12/2023   Allergy    Arthritis    Bradycardia 10/02/2013   CAD, triple-vessel disease with calcium  score 1624, CAD RADS 4 study, severe stenosis in LAD, moderate stenosis in LCx and RCA, hemodynamically significant by CT FFR in all 3 territories 10/05/2023   Cataract    Chest pain of uncertain etiology 09/19/2023   Chronic renal insufficiency, stage II (mild)    CrCl 60s   Cold sore 06/03/2014   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   Endotracheally intubated 10/12/2023   Erectile dysfunction    GERD  (gastroesophageal reflux disease)    Health maintenance examination 10/16/2014   Herpes zoster    R side of face   HTN (hypertension) 11/21/2011   Hyperlipidemia    Impaired fasting glucose 2013-2015   Kidney stones    Left bundle branch block 09/19/2023   Mild mitral regurgitation noted on IntraOp TEE 10/12/2023 10/30/2023   Myocardial infarction [facility])    Personal history of kidney stones    Postop atrial fibrillation after CABG 10/12/2023, started on amiodarone .  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   Past Surgical History:  Procedure Laterality Date   COLONOSCOPY  2002; 2014   Repeat 2024   CORONARY ARTERY BYPASS GRAFT N/A 10/12/2023   Procedure: CORONARY ARTERY BYPASS GRAFTING (CABG) X THREE, USING LEFT INTERNAL MAMMARY ARTERY AND RIGHT GREATER SAPHENEOUS VEIN HARVESTED ENDOSCOPICLY;  Surgeon: Shyrl Linnie KIDD, MD;  Location: [facility] OR;  Service: Open Heart Surgery;  Laterality: N/A;   LEFT HEART CATH AND CORONARY ANGIOGRAPHY N/A 10/10/2023   Procedure: LEFT HEART CATH AND CORONARY ANGIOGRAPHY;  Surgeon: Anner Alm ORN, MD;  Location: [facility] INVASIVE CV LAB;  Service: Cardiovascular;  Laterality: N/A;   LITHOTRIPSY     TEE WITHOUT CARDIOVERSION N/A 10/12/2023   Procedure: TRANSESOPHAGEAL ECHOCARDIOGRAM;  Surgeon: Shyrl Linnie KIDD, MD;  Location: [facility] OR;  Service: Open Heart Surgery;  Laterality: N/A;   Family History  Problem Relation Age of Onset   Hearing loss Mother    Diabetes Father    Colon cancer Neg Hx    Stomach cancer Neg Hx    Esophageal cancer Neg Hx    Rectal cancer Neg Hx    Social History   Socioeconomic  History   Marital status: Married    Spouse name: Not on file   Number of children: 0   Years of education: Not on file   Highest education level: Some college, no degree  Occupational History   Occupation: retired  Tobacco Use   Smoking status: Former    Current packs/day: 0.50    Average packs/day: 0.5 packs/day for 15.0 years (7.5 ttl pk-yrs)    Types: Cigarettes   Smokeless tobacco: Never  Vaping Use   Vaping status: Never Used  Substance and Sexual Activity   Alcohol use: Not Currently    Alcohol/week: 13.0 standard drinks of alcohol    Types: 1 Glasses of wine, 12 Cans of beer per week   Drug use: No   Sexual activity: Yes    Birth control/protection: None  Other Topics Concern   Not on file  Social  History Narrative   Married, no children.   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   No T/A/Ds.   Exercise: stationary bike/nautilus 5 days a week.         Social Drivers of Corporate investment banker Strain: Low Risk  (07/02/2024)   Overall Financial Resource Strain (CARDIA)    Difficulty of Paying Living Expenses: Not hard at all  Food Insecurity: No Food Insecurity (09/12/2024)   Hunger Vital Sign    Worried About Running Out of Food in the Last Year: Never true    Ran Out of Food in the Last Year: Never true  Transportation Needs: No Transportation Needs (09/12/2024)   PRAPARE - Administrator, Civil Service (Medical): No    Lack of Transportation (Non-Medical): No  Physical Activity: Sufficiently Active (09/12/2024)   Exercise Vital Sign    Days of Exercise per Week: 5 days    Minutes of Exercise per Session: 80 min  Stress: No Stress Concern Present (09/12/2024)   [employer] of Occupational Health - Occupational Stress Questionnaire    Feeling of Stress: Not at all  Social Connections: Socially Isolated (09/12/2024)   Social Connection and Isolation Panel    Frequency of Communication with Friends and Family: Once a week    Frequency of  Social Gatherings with Friends and Family: Once a week    Attends Religious Services: Never    Database administrator or Organizations: No    Attends Engineer, structural: Never    Marital Status: Married    Tobacco Counseling Counseling given: Not Answered    Clinical Intake:  Pre-visit preparation completed: Yes  Pain : No/denies pain     BMI - recorded: 29.53 Nutritional Status: BMI 25 -29 Overweight Nutritional Risks: None Diabetes: Yes CBG done?: No Did pt. bring in CBG monitor from home?: No  Lab Results  Component Value Date   HGBA1C 6.3 07/03/2024   HGBA1C 6.5 02/19/2024   HGBA1C 5.5 10/11/2023     How often do you need to have someone help you when you read instructions, pamphlets, or other written materials from your doctor or pharmacy?: 1 - Never What is the last grade level you completed in school?: college  Interpreter Needed?: No  Information entered by :: Lolita Libra, [facility])   Activities of Daily Living     09/11/2024   10:34 AM 10/10/2023    5:00 PM  In your present state of health, do you have any difficulty performing the following activities:  Hearing? 1   Vision? 0   Difficulty concentrating or making decisions? 0   Walking or climbing stairs? 0   Dressing or bathing? 0   Doing errands, shopping? 0 0  Preparing Food and eating ? N   Using the Toilet? N   In the past six months, have you accidently leaked urine? N   Do you have problems with loss of bowel control? N   Managing your Medications? N   Managing your Finances? N   Housekeeping or managing your Housekeeping? N     Patient Care Team: Saguier, Edward, PA-C as PCP - General (Internal Medicine)  I have updated your Care Teams any recent Medical Services you may have received from other providers in the past year.     Assessment:   This is a routine wellness examination for [employer].  Hearing/Vision screen Hearing Screening - Comments:: Wears hearing  aids Vision Screening - Comments:: Up to  date with routine eye exams with the [facility] [place]   Goals Addressed   None    Depression Screen     09/12/2024    8:23 AM 05/11/2023    3:41 PM 11/09/2022   11:15 AM 04/18/2022    1:03 PM 10/05/2021   10:57 AM 05/21/2019    8:30 AM 11/07/2018   10:05 AM  PHQ 2/9 Scores  PHQ - 2 Score 0 0 0 0 0 0 0  PHQ- 9 Score 0          Fall Risk     09/11/2024   10:34 AM 05/08/2023   10:41 AM 11/09/2022   11:15 AM 04/18/2022    1:03 PM 10/05/2021   10:57 AM  Fall Risk   Falls in the past year? 0 0 0 0 0  Number falls in past yr: 0 0 0 0 0  Injury with Fall? 0 0 0 0 0  Risk for fall due to :  No Fall Risks No Fall Risks No Fall Risks   Follow up Education provided Falls evaluation completed Falls evaluation completed  Falls evaluation completed       Data saved with a previous flowsheet row definition    MEDICARE RISK AT HOME:  Medicare Risk at Home Any stairs in or around the home?: (Patient-Rptd) No If so, are there any without handrails?: (Patient-Rptd) No Home free of loose throw rugs in walkways, pet beds, electrical cords, etc?: (Patient-Rptd) Yes Adequate lighting in your home to reduce risk of falls?: (Patient-Rptd) Yes Life alert?: (Patient-Rptd) No Use of a cane, walker or w/c?: (Patient-Rptd) No Grab bars in the bathroom?: (Patient-Rptd) Yes Shower chair or bench in shower?: (Patient-Rptd) No Elevated toilet seat or a handicapped toilet?: (Patient-Rptd) No  TIMED UP AND GO:  Was the test performed?  Yes  Length of time to ambulate 10 feet: 6 sec Gait steady and fast without use of assistive device  Cognitive Function: 6CIT completed        09/12/2024    8:26 AM 05/11/2023    3:47 PM 04/18/2022    1:06 PM  6CIT Screen  What Year? 0 points 0 points 0 points  What month? 0 points 0 points 0 points  What time? 0 points 0 points 0 points  Count back from 20 0 points 0 points 0 points  Months in reverse 0 points 0 points  0 points  Repeat phrase 0 points 0 points 0 points  Total Score 0 points 0 points 0 points    Immunizations Immunization History  Administered Date(s) Administered   Fluad  Quad(high Dose 65+) 11/17/2020, 09/10/2021, 11/09/2022   Fluad  Trivalent(High Dose 65+) 08/29/2023   INFLUENZA, HIGH DOSE SEASONAL PF 09/14/2016, 11/06/2017, 08/20/2018, 08/31/2019, 09/12/2024   Influenza Split 12/16/2011   Influenza,inj,Quad PF,6+ Mos 10/02/2013, 09/19/2014, 11/03/2015   Moderna Covid-19 Vaccine Bivalent Booster 36yrs & up 08/17/2021   PFIZER Comirnaty (Gray Top)Covid-19 Tri-Sucrose Vaccine 03/16/2021   PFIZER(Purple Top)SARS-COV-2 Vaccination 01/09/2020, 01/30/2020, 08/29/2020, 09/10/2020   PNEUMOCOCCAL CONJUGATE-20 01/23/2023   Pfizer Covid-19 Vaccine Bivalent Booster 15yrs & up 03/31/2022   Pfizer(Comirnaty )Fall Seasonal Vaccine 12 years and older 09/23/2022, 08/29/2023   Pneumococcal Conjugate,unspecified 01/23/2023   Pneumococcal Conjugate-13 11/03/2016   Pneumococcal Polysaccharide-23 11/06/2017, 08/31/2019   Tdap 07/21/2006, 08/20/2018   Zoster Recombinant(Shingrix ) 07/22/2017, 10/16/2017, 10/20/2021    Screening Tests Health Maintenance  Topic Date Due   Diabetic kidney evaluation - Urine ACR  Never done   Medicare Annual Wellness (AWV)  05/10/2024  COVID-19 Vaccine (10 - 2025-26 season) 08/05/2024   Diabetic kidney evaluation - eGFR measurement  07/10/2025   DTaP/Tdap/Td (3 - Td or Tdap) 08/20/2028   Pneumococcal Vaccine: 50+ Years  Completed   Influenza Vaccine  Completed   Hepatitis C Screening  Completed   Zoster Vaccines- Shingrix   Completed   Meningococcal B Vaccine  Aged Out   Colonoscopy  Discontinued    Health Maintenance Items Addressed: Received flu vaccine today. Will get COVID vaccine at pharmacy. Will complete urine MALB today.   Additional Screening:  Vision Screening: Recommended annual ophthalmology exams for early detection of glaucoma and other disorders of  the eye. Is the patient up to date with their annual eye exam?  Yes  Who is the provider or what is the name of the office in which the patient attends annual eye exams? [facility]  Dental Screening: Recommended annual dental exams for proper oral hygiene  Community Resource Referral / Chronic Care Management: CRR required this visit?  No   CCM required this visit?  No   Plan:    I have personally reviewed and noted the following in the patient's chart:   Medical and social history Use of alcohol, tobacco or illicit drugs  Current medications and supplements including opioid prescriptions. Patient is not currently taking opioid prescriptions. Functional ability and status Nutritional status Physical activity Advanced directives List of other physicians Hospitalizations, surgeries, and ER visits in previous 12 months Vitals Screenings to include cognitive, depression, and falls Referrals and appointments  In addition, I have reviewed and discussed with patient certain preventive protocols, quality metrics, and best practice recommendations. A written personalized care plan for preventive services as well as general preventive health recommendations were provided to patient.   Lolita Libra, CMA   09/12/2024   After Visit Summary: (In Person-Printed) AVS printed and given to the patient  Notes: see phone note

## 2024-09-12 NOTE — Patient Instructions (Addendum)
 Mr. Leroy Proctor , Thank you for taking time out of your busy schedule to complete your Annual Wellness Visit with me. I enjoyed our conversation and look forward to speaking with you again next year. I, as well as your care team,  appreciate your ongoing commitment to your health goals. Please review the following plan we discussed and let me know if I can assist you in the future. Your Game plan/ To Do List     Please stop at the lab for the urine Microalbumin test.  Follow up Visits: Next Medicare AWV with our clinical staff: 09/17/25 8:20am in person.    Next Office Visit with your provider: 10/14/24 8:20am  physical, [name], Pa-C  Clinician Recommendations:  Aim for 30 minutes of exercise or brisk walking, 6-8 glasses of water, and 5 servings of fruits and vegetables each day.    You will need to get the following vaccines at your local pharmacy (downstairs):  COVID     This is a list of the screening recommended for you and due dates:  Health Maintenance  Topic Date Due   Yearly kidney health urinalysis for diabetes  Never done   Medicare Annual Wellness Visit  05/10/2024   COVID-19 Vaccine (10 - 2025-26 season) 08/05/2024   Yearly kidney function blood test for diabetes  07/10/2025   DTaP/Tdap/Td vaccine (3 - Td or Tdap) 08/20/2028   Pneumococcal Vaccine for age over 53  Completed   Flu Shot  Completed   Hepatitis C Screening  Completed   Zoster (Shingles) Vaccine  Completed   Meningitis B Vaccine  Aged Out   Colon Cancer Screening  Discontinued    Advanced directives: (Copy Requested) Please bring a copy of your health care power of attorney and living will to the office to be added to your chart at your convenience. You can mail to [facility] [address] [place], [place] [address] or email to [email or web address] Advance Care Planning is important because it:  [x]  Makes sure you receive the medical care that is consistent with your values,  goals, and preferences  [x]  It provides guidance to your family and loved ones and reduces their decisional burden about whether or not they are making the right decisions based on your wishes.  Follow the link provided in your after visit summary or read over the paperwork we have mailed to you to help you started getting your Advance Directives in place. If you need assistance in completing these, please reach out to us  so that we can help you!  See attachments for Preventive Care and Fall Prevention Tips.

## 2024-09-12 NOTE — Telephone Encounter (Signed)
 Pt had AWV today. BP was 161/83 initially and repeat was 156/68. He reports home readings always under 130s/80s.  Please advise?

## 2024-09-17 ENCOUNTER — Ambulatory Visit: Admitting: *Deleted

## 2024-09-17 DIAGNOSIS — I10 Essential (primary) hypertension: Secondary | ICD-10-CM

## 2024-09-17 NOTE — Progress Notes (Signed)
 Pt here for BP check per PCP.  Pt is currently taking  Amlodipine  10mg , Losartan  25mg , Metoprolol  25mg   Pt stated that he was not taking all 3 medications when he was here for his AWV but has since started all 3.   Today's reading is - 120/74  His home cuff reads - 132/77  Per PCP, stay on this regimen and follow up for CPE on 10/14/24.

## 2024-09-24 ENCOUNTER — Other Ambulatory Visit: Payer: Self-pay

## 2024-10-04 ENCOUNTER — Encounter: Payer: Self-pay | Admitting: Medical

## 2024-10-04 ENCOUNTER — Ambulatory Visit: Admitting: Medical

## 2024-10-07 ENCOUNTER — Encounter: Payer: Self-pay | Admitting: Radiology

## 2024-10-14 ENCOUNTER — Ambulatory Visit: Payer: Self-pay | Admitting: Medical

## 2024-10-14 ENCOUNTER — Ambulatory Visit (INDEPENDENT_AMBULATORY_CARE_PROVIDER_SITE_OTHER): Admitting: Medical

## 2024-10-14 VITALS — BP 128/78 | HR 50 | Temp 98.2°F | Resp 14 | Ht 68.0 in | Wt 196.0 lb

## 2024-10-14 DIAGNOSIS — E119 Type 2 diabetes mellitus without complications: Secondary | ICD-10-CM

## 2024-10-14 DIAGNOSIS — I2510 Atherosclerotic heart disease of native coronary artery without angina pectoris: Secondary | ICD-10-CM | POA: Diagnosis not present

## 2024-10-14 DIAGNOSIS — R350 Frequency of micturition: Secondary | ICD-10-CM | POA: Diagnosis not present

## 2024-10-14 DIAGNOSIS — D649 Anemia, unspecified: Secondary | ICD-10-CM

## 2024-10-14 DIAGNOSIS — K219 Gastro-esophageal reflux disease without esophagitis: Secondary | ICD-10-CM

## 2024-10-14 DIAGNOSIS — E785 Hyperlipidemia, unspecified: Secondary | ICD-10-CM | POA: Diagnosis not present

## 2024-10-14 DIAGNOSIS — I10 Essential (primary) hypertension: Secondary | ICD-10-CM

## 2024-10-14 DIAGNOSIS — Z1283 Encounter for screening for malignant neoplasm of skin: Secondary | ICD-10-CM | POA: Diagnosis not present

## 2024-10-14 DIAGNOSIS — Z7984 Long term (current) use of oral hypoglycemic drugs: Secondary | ICD-10-CM

## 2024-10-14 LAB — CBC WITH DIFFERENTIAL/PLATELET
Basophils Absolute: 0.1 K/uL (ref 0.0–0.1)
Basophils Relative: 1 % (ref 0.0–3.0)
Eosinophils Absolute: 0.3 K/uL (ref 0.0–0.7)
Eosinophils Relative: 4.9 % (ref 0.0–5.0)
HCT: 45 % (ref 39.0–52.0)
Hemoglobin: 14.9 g/dL (ref 13.0–17.0)
Lymphocytes Relative: 17.2 % (ref 12.0–46.0)
Lymphs Abs: 1 K/uL (ref 0.7–4.0)
MCHC: 33.2 g/dL (ref 30.0–36.0)
MCV: 84.7 fl (ref 78.0–100.0)
Monocytes Absolute: 0.7 K/uL (ref 0.1–1.0)
Monocytes Relative: 11.6 % (ref 3.0–12.0)
Neutro Abs: 3.7 K/uL (ref 1.4–7.7)
Neutrophils Relative %: 65.3 % (ref 43.0–77.0)
Platelets: 209 K/uL (ref 150.0–400.0)
RBC: 5.31 Mil/uL (ref 4.22–5.81)
RDW: 14.4 % (ref 11.5–15.5)
WBC: 5.7 K/uL (ref 4.0–10.5)

## 2024-10-14 LAB — LIPID PANEL
Cholesterol: 130 mg/dL (ref 0–200)
HDL: 34.9 mg/dL — ABNORMAL LOW (ref >39.00)
LDL Cholesterol: 81 mg/dL (ref 0–99)
NonHDL: 95.5
Total CHOL/HDL Ratio: 4
Triglycerides: 74 mg/dL (ref 0.0–149.0)
VLDL: 14.8 mg/dL (ref 0.0–40.0)

## 2024-10-14 LAB — PSA: PSA: 0.21 ng/mL (ref 0.10–4.00)

## 2024-10-14 LAB — HEMOGLOBIN A1C: Hgb A1c MFr Bld: 6 % (ref 4.6–6.5)

## 2024-10-14 NOTE — Progress Notes (Signed)
 Subjective:    Patient ID: Leroy Proctor, male    DOB: 03-Dec-1951, 73 y.o.   MRN: [id number]  HPI  Leroy Proctor is a 73 year old male who presents for routine follow-up.  His blood pressure is controlled with amlodipine  10 mg, losartan  25 mg, and metoprolol . He is fasting today for lab work.  His cholesterol levels were checked three months ago, showing good overall numbers except for a low HDL of 36. He continues to exercise regularly. He is on rosuvastatin 80 mg daily for hyperlipidemia and atherosclerosis.  For diabetes management, he is on Farxiga  5 mg, follows a low sugar diet, and exercises regularly. His last visit to the cardiologist was on July 09, 2024.  He experiences frequent urination, particularly at night, but denies any urinary symptoms during the day. He drinks a lot of water. He describes the sensation as 'like clockwork at night.' This has been the case for years.  He takes omeprazole  20 mg daily for heartburn or reflux.  He has a history of anemia noted about a year ago, possibly related to past surgeries. A CBC and iron panel are planned to assess this further.  He has not seen a dermatologist in four to five years and does not recall the name of the dermatologist he saw previously.        Review of Systems  Constitutional:  Negative for chills, fatigue and unexpected weight change.  HENT:  Negative for congestion.   Respiratory:  Negative for chest tightness, shortness of breath and wheezing.   Cardiovascular:  Negative for chest pain and palpitations.  Gastrointestinal:  Negative for abdominal pain, diarrhea and vomiting.  Genitourinary:  Positive for frequency. Negative for decreased urine volume, dysuria and penile swelling.  Musculoskeletal:  Negative for back pain, myalgias and neck stiffness.  Skin:  Negative for rash.  Neurological:  Negative for dizziness and light-headedness.  Hematological:  Negative for adenopathy.  Psychiatric/Behavioral:   Negative for behavioral problems.    Past Medical History:  Diagnosis Date   ABLA (acute blood loss anemia) 10/12/2023   Allergy    Arthritis    Bradycardia 10/02/2013   CAD, triple-vessel disease with calcium  score 1624, CAD RADS 4 study, severe stenosis in LAD, moderate stenosis in LCx and RCA, hemodynamically significant by CT FFR in all 3 territories 10/05/2023   Cataract    Chest pain of uncertain etiology 09/19/2023   Chronic renal insufficiency, stage II (mild)    CrCl 60s   Cold sore 06/03/2014   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   Endotracheally intubated 10/12/2023   Erectile dysfunction    GERD (gastroesophageal reflux disease)    Health maintenance examination 10/16/2014   Herpes zoster    R side of face   HTN (hypertension) 11/21/2011   Hyperlipidemia    Impaired fasting glucose 2013-2015   Kidney stones    Left bundle branch block 09/19/2023   Mild mitral regurgitation noted on IntraOp TEE 10/12/2023 10/30/2023   Myocardial infarction [facility])    Personal history of kidney stones    Postop atrial fibrillation after CABG 10/12/2023, started on amiodarone .  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     Social History   Socioeconomic History   Marital status: Married    Spouse name: Not on file   Number of children: 0   Years of education: Not on file   Highest education level: Some college, no degree  Occupational History   Occupation: retired  Tobacco Use   Smoking status: Former    Current packs/day: 0.50    Average packs/day: 0.5 packs/day for 15.0  years (7.5 ttl pk-yrs)    Types: Cigarettes   Smokeless tobacco: Never  Vaping Use   Vaping status: Never Used  Substance and Sexual Activity   Alcohol use: Not Currently    Alcohol/week: 13.0 standard drinks of alcohol    Types: 1 Glasses of wine, 12 Cans of beer per week   Drug use: No   Sexual activity: Yes    Birth control/protection: None  Other Topics Concern   Not on file  Social History Narrative   Married, no children.   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   No T/A/Ds.   Exercise: stationary bike/nautilus 5 days a week.         Social Drivers of Corporate Investment Banker Strain: Low Risk  (10/10/2024)   Overall Financial Resource Strain (CARDIA)    Difficulty of Paying Living Expenses: Not hard at all  Food Insecurity: No Food Insecurity (10/10/2024)   Hunger Vital Sign    Worried About Running Out of Food in the Last Year: Never true    Ran Out of Food in the Last Year: Never true  Transportation Needs: Patient Declined (10/10/2024)   PRAPARE - Transportation    Lack of Transportation (Medical): Patient declined    Lack of Transportation (Non-Medical): Patient declined  Physical Activity: Sufficiently Active (10/10/2024)   Exercise Vital Sign    Days of Exercise per Week: 5 days    Minutes of Exercise per Session: 90 min  Stress: No Stress Concern Present (10/10/2024)   [employer] of Occupational Health - Occupational Stress Questionnaire    Feeling of Stress: Only a little  Social Connections: Unknown (10/10/2024)   Social Connection and Isolation Panel    Frequency of Communication with Friends and Family: Patient declined    Frequency of Social Gatherings with Friends and Family: Patient declined    Attends Religious Services: Patient declined    Database Administrator or Organizations: Patient declined    Attends Engineer, Structural: Not on file    Marital Status: Married  Recent Concern: Social Connections - Socially Isolated  (09/12/2024)   Social Connection and Isolation Panel    Frequency of Communication with Friends and Family: Once a week    Frequency of Social Gatherings with Friends and Family: Once a week    Attends Religious Services: Never    Database Administrator or Organizations: No    Attends Banker Meetings: Never    Marital Status: Married  Catering Manager Violence: Not At Risk (09/12/2024)   Humiliation, Afraid, Rape, and Kick questionnaire    Fear of Current or Ex-Partner: No    Emotionally Abused: No    Physically Abused: No    Sexually Abused: No    Past Surgical History:  Procedure Laterality Date   COLONOSCOPY  2002; 2014   Repeat 2024   CORONARY ARTERY BYPASS GRAFT N/A 10/12/2023   Procedure: CORONARY ARTERY BYPASS GRAFTING (CABG) X THREE, USING LEFT INTERNAL MAMMARY ARTERY AND RIGHT  GREATER SAPHENEOUS VEIN HARVESTED ENDOSCOPICLY;  Surgeon: Shyrl Linnie KIDD, MD;  Location: [facility] OR;  Service: Open Heart Surgery;  Laterality: N/A;   LEFT HEART CATH AND CORONARY ANGIOGRAPHY N/A 10/10/2023   Procedure: LEFT HEART CATH AND CORONARY ANGIOGRAPHY;  Surgeon: Anner Alm ORN, MD;  Location[facility] INVASIVE CV LAB;  Service: Cardiovascular;  Laterality: N/A;   LITHOTRIPSY     TEE WITHOUT CARDIOVERSION N/A 10/12/2023   Procedure: TRANSESOPHAGEAL ECHOCARDIOGRAM;  Surgeon: Shyrl Linnie KIDD, MD;  Location: [facility] OR;  Service: Open Heart Surgery;  Laterality: N/A;    Family History  Problem Relation Age of Onset   Hearing loss Mother    Diabetes Father    Colon cancer Neg Hx    Stomach cancer Neg Hx    Esophageal cancer Neg Hx    Rectal cancer Neg Hx     Allergies  Allergen Reactions   Atenolol  Other (See Comments)    bradycarda- heart rate down to 38 bpm   Demerol Nausea And Vomiting    Current Outpatient Medications on File Prior to Visit  Medication Sig Dispense Refill   amiodarone  (PACERONE ) 200 MG tablet Take 1 tablet (200 mg total) by mouth daily. 18 tablet 0    amLODipine  (NORVASC ) 10 MG tablet TAKE 1 TABLET BY MOUTH DAILY 90 tablet 3   aspirin  EC 325 MG tablet Take 1 tablet (325 mg total) by mouth daily.     atorvastatin  (LIPITOR) 80 MG tablet TAKE 1 TABLET BY MOUTH DAILY 90 tablet 0   cetirizine (ZYRTEC) 10 MG tablet Take 10 mg by mouth as needed for allergies.     dapagliflozin  propanediol (FARXIGA ) 5 MG TABS tablet Take 1 tablet (5 mg total) by mouth daily before breakfast. 90 tablet 3   fluticasone  (FLONASE ) 50 MCG/ACT nasal spray Place 2 sprays into both nostrils daily. 16 g 1   losartan  (COZAAR ) 25 MG tablet TAKE 1 TABLET BY MOUTH DAILY 90 tablet 0   metoprolol  tartrate (LOPRESSOR ) 25 MG tablet TAKE 1 TABLET BY MOUTH 2 TIMES A DAY 180 tablet 2   omeprazole  (PRILOSEC) 20 MG capsule TAKE 1 CAPSULE BY MOUTH EVERY MORNING 30 MINUTES BEFORE BREAKFAST 90 capsule 3   tadalafil  (CIALIS ) 10 MG tablet TAKE 1 TABLET BY MOUTH EVERY OTHER DAY AS NEEDED FOR ERECTILE DYSFUNCTION 10 tablet 1   No current facility-administered medications on file prior to visit.    BP 128/78   Pulse (!) 50   Temp 98.2 F (36.8 C) (Oral)   Resp 14   Ht 5' 8 (1.727 m)   Wt 196 lb (88.9 kg)   SpO2 96%   BMI 29.80 kg/m          Objective:   Physical Exam  General Mental Status- Alert. General Appearance- Not in acute distress.   Skin General: Color- Normal Color. Moisture- Normal Moisture.  Neck Carotid Arteries- Normal color. Moisture- Normal Moisture. No carotid bruits. No JVD.  Chest and Lung Exam Auscultation: Breath Sounds:-CTA  Cardiovascular Auscultation:Rythm- RRR Murmurs & Other Heart Sounds:Auscultation of the heart reveals- No Murmurs.  Abdomen Inspection:-Inspeection Normal. Palpation/Percussion:Note:No mass. Palpation and Percussion of the abdomen reveal- Non Tender, Non Distended + BS, no rebound or guarding.   Neurologic Cranial Nerve exam:- CN III-XII intact(No nystagmus), symmetric smile. Strength:- 5/5 equal and symmetric  strength both upper and lower extremities.       Assessment & Plan:   CAD Managed with rosuvastatin 80 mg daily. Recent cardiology evaluation showed sinus rhythm, first degree  AV block, and occasional PVCs. - Continue rosuvastatin 80 mg daily.  Type 2 diabetes mellitus Managed with Farxiga  5 mg daily, low sugar diet, and regular exercise. - Continue Farxiga  5 mg daily. - Ordered A1c test.  Essential hypertension Well-controlled with amlodipine  10 mg, losartan  25 mg, and metoprolol . Current blood pressure is 128/78 mmHg. - Continue current antihypertensive regimen.  Hyperlipidemia Managed with rosuvastatin 80 mg daily. Previous lipid panel showed low HDL, possibly due to genetic factors. - Continue rosuvastatin 80 mg daily. - Ordered lipid panel.  Gastroesophageal reflux disease Managed with omeprazole  20 mg daily. - Continue omeprazole  20 mg daily.  Anemia Noted approximately a year ago, possibly related to past surgeries. Further evaluation needed. - Ordered CBC and iron panel.  Urinary frequency Primarily nocturnal with historically low PSA levels. - Ordered PSA test.  Screening for skin cancer Multiple small, scattered moles and lesions warrant dermatological evaluation. - Referred to dermatologist for skin evaluation.  Follow up date to be determined after lab review   [name], PA-C

## 2024-10-14 NOTE — Patient Instructions (Addendum)
 CAD Managed with rosuvastatin 80 mg daily. Recent cardiology evaluation showed sinus rhythm, first degree AV block, and occasional PVCs. - Continue rosuvastatin 80 mg daily.  Type 2 diabetes mellitus Managed with Farxiga  5 mg daily, low sugar diet, and regular exercise. - Continue Farxiga  5 mg daily. - Ordered A1c test.  Essential hypertension Well-controlled with amlodipine  10 mg, losartan  25 mg, and metoprolol . Current blood pressure is 128/78 mmHg. - Continue current antihypertensive regimen.  Hyperlipidemia Managed with rosuvastatin 80 mg daily. Previous lipid panel showed low HDL, possibly due to genetic factors. - Continue rosuvastatin 80 mg daily. - Ordered lipid panel.  Gastroesophageal reflux disease Managed with omeprazole  20 mg daily. - Continue omeprazole  20 mg daily.  Anemia Noted approximately a year ago, possibly related to past surgeries. Further evaluation needed. - Ordered CBC and iron panel.  Urinary frequency Primarily nocturnal with historically low PSA levels. - Ordered PSA test.  Screening for skin cancer Multiple small, scattered moles and lesions warrant dermatological evaluation. - Referred to dermatologist for skin evaluation.  Follow up date to be determined after lab review

## 2024-10-15 LAB — COMPLETE METABOLIC PANEL WITHOUT GFR
AG Ratio: 1.3 (calc) (ref 1.0–2.5)
ALT: 20 U/L (ref 9–46)
AST: 18 U/L (ref 10–35)
Albumin: 4.2 g/dL (ref 3.6–5.1)
Alkaline phosphatase (APISO): 75 U/L (ref 35–144)
BUN: 22 mg/dL (ref 7–25)
CO2: 31 mmol/L (ref 20–32)
Calcium: 9 mg/dL (ref 8.6–10.3)
Chloride: 103 mmol/L (ref 98–110)
Creat: 1.04 mg/dL (ref 0.70–1.28)
Globulin: 3.3 g/dL (ref 1.9–3.7)
Glucose, Bld: 77 mg/dL (ref 65–99)
Potassium: 4.8 mmol/L (ref 3.5–5.3)
Sodium: 139 mmol/L (ref 135–146)
Total Bilirubin: 0.7 mg/dL (ref 0.2–1.2)
Total Protein: 7.5 g/dL (ref 6.1–8.1)

## 2024-10-15 LAB — IRON,TIBC AND FERRITIN PANEL
%SAT: 21 % (ref 20–48)
Ferritin: 38 ng/mL (ref 24–380)
Iron: 59 ug/dL (ref 50–180)
TIBC: 282 ug/dL (ref 250–425)

## 2024-10-17 LAB — LAB REPORT - SCANNED: Creatinine, POC: 3 mg/dL

## 2024-10-21 DIAGNOSIS — L814 Other melanin hyperpigmentation: Secondary | ICD-10-CM | POA: Diagnosis not present

## 2024-10-21 DIAGNOSIS — L570 Actinic keratosis: Secondary | ICD-10-CM | POA: Diagnosis not present

## 2024-10-21 DIAGNOSIS — D1801 Hemangioma of skin and subcutaneous tissue: Secondary | ICD-10-CM | POA: Diagnosis not present

## 2024-10-21 DIAGNOSIS — L821 Other seborrheic keratosis: Secondary | ICD-10-CM | POA: Diagnosis not present

## 2024-10-28 ENCOUNTER — Telehealth: Admitting: Physician Assistant

## 2024-10-28 DIAGNOSIS — J208 Acute bronchitis due to other specified organisms: Secondary | ICD-10-CM

## 2024-10-28 MED ORDER — BENZONATATE 100 MG PO CAPS: 100.0000 mg | ORAL_CAPSULE | Freq: Three times a day (TID) | ORAL | 0 refills | Status: AC | PRN

## 2024-10-28 MED ORDER — ALBUTEROL SULFATE HFA 108 (90 BASE) MCG/ACT IN AERS: 1.0000 | INHALATION_SPRAY | Freq: Four times a day (QID) | RESPIRATORY_TRACT | 0 refills | Status: AC | PRN

## 2024-10-28 MED ORDER — PREDNISONE 20 MG PO TABS: 40.0000 mg | ORAL_TABLET | Freq: Every day | ORAL | 0 refills | Status: AC

## 2024-10-28 MED ORDER — PROMETHAZINE-DM 6.25-15 MG/5ML PO SYRP: 5.0000 mL | ORAL_SOLUTION | Freq: Four times a day (QID) | ORAL | 0 refills | Status: AC | PRN

## 2024-10-28 NOTE — Patient Instructions (Signed)
 Leroy Proctor, thank you for joining Delon CHRISTELLA Dickinson, PA-C for today's virtual visit.  While this provider is not your primary care provider (PCP), if your PCP is located in our provider database this encounter information will be shared with them immediately following your visit.   A [facility] MyChart account gives you access to today's visit and all your visits, tests, and labs performed at [facility]  click here if you don't have a  MyChart account or go to [email or web address]  Consent: (Patient) Leroy Proctor provided verbal consent for this virtual visit at the beginning of the encounter.  Current Medications:  Current Outpatient Medications:    albuterol  (VENTOLIN  HFA) 108 (90 Base) MCG/ACT inhaler, Inhale 1-2 puffs into the lungs every 6 (six) hours as needed., Disp: 8 g, Rfl: 0   benzonatate  (TESSALON ) 100 MG capsule, Take 1 capsule (100 mg total) by mouth 3 (three) times daily as needed., Disp: 30 capsule, Rfl: 0   predniSONE  (DELTASONE ) 20 MG tablet, Take 2 tablets (40 mg total) by mouth daily with breakfast., Disp: 10 tablet, Rfl: 0   promethazine -dextromethorphan (PROMETHAZINE -DM) 6.25-15 MG/5ML syrup, Take 5 mLs by mouth 4 (four) times daily as needed., Disp: 118 mL, Rfl: 0   amiodarone  (PACERONE ) 200 MG tablet, Take 1 tablet (200 mg total) by mouth daily., Disp: 18 tablet, Rfl: 0   amLODipine  (NORVASC ) 10 MG tablet, TAKE 1 TABLET BY MOUTH DAILY, Disp: 90 tablet, Rfl: 3   aspirin  EC 325 MG tablet, Take 1 tablet (325 mg total) by mouth daily., Disp: , Rfl:    atorvastatin  (LIPITOR) 80 MG tablet, TAKE 1 TABLET BY MOUTH DAILY, Disp: 90 tablet, Rfl: 0   cetirizine (ZYRTEC) 10 MG tablet, Take 10 mg by mouth as needed for allergies., Disp: , Rfl:    dapagliflozin  propanediol (FARXIGA ) 5 MG TABS tablet, Take 1 tablet (5 mg total) by mouth daily before breakfast., Disp: 90 tablet, Rfl: 3   fluticasone  (FLONASE ) 50 MCG/ACT nasal spray, Place 2 sprays  into both nostrils daily., Disp: 16 g, Rfl: 1   losartan  (COZAAR ) 25 MG tablet, TAKE 1 TABLET BY MOUTH DAILY, Disp: 90 tablet, Rfl: 0   metoprolol  tartrate (LOPRESSOR ) 25 MG tablet, TAKE 1 TABLET BY MOUTH 2 TIMES A DAY, Disp: 180 tablet, Rfl: 2   omeprazole  (PRILOSEC) 20 MG capsule, TAKE 1 CAPSULE BY MOUTH EVERY MORNING 30 MINUTES BEFORE BREAKFAST, Disp: 90 capsule, Rfl: 3   tadalafil  (CIALIS ) 10 MG tablet, TAKE 1 TABLET BY MOUTH EVERY OTHER DAY AS NEEDED FOR ERECTILE DYSFUNCTION, Disp: 10 tablet, Rfl: 1   Medications ordered in this encounter:  Meds ordered this encounter  Medications   albuterol  (VENTOLIN  HFA) 108 (90 Base) MCG/ACT inhaler    Sig: Inhale 1-2 puffs into the lungs every 6 (six) hours as needed.    Dispense:  8 g    Refill:  0    Supervising Provider:   BLAISE ALEENE KIDD [id number]   predniSONE  (DELTASONE ) 20 MG tablet    Sig: Take 2 tablets (40 mg total) by mouth daily with breakfast.    Dispense:  10 tablet    Refill:  0    Supervising Provider:   LAMPTEY, PHILIP O [[id number]]   promethazine -dextromethorphan (PROMETHAZINE -DM) 6.25-15 MG/5ML syrup    Sig: Take 5 mLs by mouth 4 (four) times daily as needed.    Dispense:  118 mL    Refill:  0    Supervising Provider:   BLAISE ALEENE KIDD [phone]  benzonatate  (TESSALON ) 100 MG capsule    Sig: Take 1 capsule (100 mg total) by mouth 3 (three) times daily as needed.    Dispense:  30 capsule    Refill:  0    Supervising Provider:   BLAISE ALEENE KIDD [[id number]]     *If you need refills on other medications prior to your next appointment, please contact your pharmacy*  Follow-Up: Call back or seek an in-person evaluation if the symptoms worsen or if the condition fails to improve as anticipated.  [facility] [phone]  Other Instructions Acute Bronchitis, Adult  Acute bronchitis is sudden inflammation of the main airways (bronchi) that come off the windpipe (trachea) in the lungs. The swelling causes  the airways to get smaller and make more mucus than normal. This can make it hard to breathe and can cause coughing or noisy breathing (wheezing). Acute bronchitis may last several weeks. The cough may last longer. Allergies, asthma, and exposure to smoke may make the condition worse. What are the causes? This condition can be caused by germs and by substances that irritate the lungs, including: Cold and flu viruses. The most common cause of this condition is the virus that causes the common cold. Bacteria. This is less common. Breathing in substances that irritate the lungs, including: Smoke from cigarettes and other forms of tobacco. Dust and pollen. Fumes from household cleaning products, gases, or burned fuel. Indoor or outdoor air pollution. What increases the risk? The following factors may make you more likely to develop this condition: A weak body's defense system, also called the immune system. A condition that affects your lungs and breathing, such as asthma. What are the signs or symptoms? Common symptoms of this condition include: Coughing. This may bring up clear, yellow, or green mucus from your lungs (sputum). Wheezing. Runny or stuffy nose. Having too much mucus in your lungs (chest congestion). Shortness of breath. Aches and pains, including sore throat or chest. How is this diagnosed? This condition is usually diagnosed based on: Your symptoms and medical history. A physical exam. You may also have other tests, including tests to rule out other conditions, such as pneumonia. These tests include: A test of lung function. Test of a mucus sample to look for the presence of bacteria. Tests to check the oxygen level in your blood. Blood tests. Chest X-ray. How is this treated? Most cases of acute bronchitis clear up over time without treatment. Your health care provider may recommend: Drinking more fluids to help thin your mucus so it is easier to cough up. Taking  inhaled medicine (inhaler) to improve air flow in and out of your lungs. Using a vaporizer or a humidifier. These are machines that add water to the air to help you breathe better. Taking a medicine that thins mucus and clears congestion (expectorant). Taking a medicine that prevents or stops coughing (cough suppressant). It is not common to take an antibiotic medicine for this condition. Follow these instructions at home:  Take over-the-counter and prescription medicines only as told by your health care provider. Use an inhaler, vaporizer, or humidifier as told by your health care provider. Take two teaspoons (10 mL) of honey at bedtime to lessen coughing at night. Drink enough fluid to keep your urine pale yellow. Do not use any products that contain nicotine or tobacco. These products include cigarettes, chewing tobacco, and vaping devices, such as e-cigarettes. If you need help quitting, ask your health care provider. Get plenty of rest.  Return to your normal activities as told by your health care provider. Ask your health care provider what activities are safe for you. Keep all follow-up visits. This is important. How is this prevented? To lower your risk of getting this condition again: Wash your hands often with soap and water for at least 20 seconds. If soap and water are not available, use hand sanitizer. Avoid contact with people who have cold symptoms. Try not to touch your mouth, nose, or eyes with your hands. Avoid breathing in smoke or chemical fumes. Breathing smoke or chemical fumes will make your condition worse. Get the flu shot every year. Contact a health care provider if: Your symptoms do not improve after 2 weeks. You have trouble coughing up the mucus. Your cough keeps you awake at night. You have a fever. Get help right away if you: Cough up blood. Feel pain in your chest. Have severe shortness of breath. Faint or keep feeling like you are going to faint. Have  a severe headache. Have a fever or chills that get worse. These symptoms may represent a serious problem that is an emergency. Do not wait to see if the symptoms will go away. Get medical help right away. Call your local emergency services (911 in the [place] Do not drive yourself to the hospital. Summary Acute bronchitis is inflammation of the main airways (bronchi) that come off the windpipe (trachea) in the lungs. The swelling causes the airways to get smaller and make more mucus than normal. Drinking more fluids can help thin your mucus so it is easier to cough up. Take over-the-counter and prescription medicines only as told by your health care provider. Do not use any products that contain nicotine or tobacco. These products include cigarettes, chewing tobacco, and vaping devices, such as e-cigarettes. If you need help quitting, ask your health care provider. Contact a health care provider if your symptoms do not improve after 2 weeks. This information is not intended to replace advice given to you by your health care provider. Make sure you discuss any questions you have with your health care provider. Document Revised: 03/03/2022 Document Reviewed: 03/24/2021 Elsevier Patient Education  2024 Elsevier Inc.   If you have been instructed to have an in-person evaluation today at a local Urgent Care facility, please use the link below. It will take you to a list of all of our available [facility], including address, phone number and hours of operation. Please do not delay care.  [facility]  If you or a family member do not have a primary care provider, use the link below to schedule a visit and establish care. When you choose a [facility] primary care physician or advanced practice provider, you gain a long-term partner in health. Find a Primary Care Provider  Learn more about [facility]'s in-office and virtual care options: [facility] - Get Care Now

## 2024-10-28 NOTE — Progress Notes (Signed)
 Virtual Visit Consent   DELVECCHIO MADOLE, you are scheduled for a virtual visit with a [facility] provider today. Just as with appointments in the office, your consent must be obtained to participate. Your consent will be active for this visit and any virtual visit you may have with one of our providers in the next 365 days. If you have a MyChart account, a copy of this consent can be sent to you electronically.  As this is a virtual visit, video technology does not allow for your provider to perform a traditional examination. This may limit your provider's ability to fully assess your condition. If your provider identifies any concerns that need to be evaluated in person or the need to arrange testing (such as labs, EKG, etc.), we will make arrangements to do so. Although advances in technology are sophisticated, we cannot ensure that it will always work on either your end or our end. If the connection with a video visit is poor, the visit may have to be switched to a telephone visit. With either a video or telephone visit, we are not always able to ensure that we have a secure connection.  By engaging in this virtual visit, you consent to the provision of healthcare and authorize for your insurance to be billed (if applicable) for the services provided during this visit. Depending on your insurance coverage, you may receive a charge related to this service.  I need to obtain your verbal consent now. Are you willing to proceed with your visit today? Leroy Proctor has provided verbal consent on 10/28/2024 for a virtual visit (video or telephone). Delon CHRISTELLA Dickinson, PA-C  Date: 10/28/2024 8:54 AM   Virtual Visit via Video Note   I, Delon CHRISTELLA Dickinson, connected with  Leroy Proctor  ([id number], [id number]) on 10/28/24 at  8:45 AM EST by a video-enabled telemedicine application and verified that I am speaking with the correct person using two identifiers.  Location: Patient: Virtual Visit Location  Patient: Home Provider: Virtual Visit Location Provider: Home Office   I discussed the limitations of evaluation and management by telemedicine and the availability of in person appointments. The patient expressed understanding and agreed to proceed.    History of Present Illness: Leroy Proctor is a 73 y.o. who identifies as a male who was assigned male at birth, and is being seen today for cough and chest congestion.  HPI: Cough This is a new problem. The current episode started in the past 7 days (3-4 days). The problem has been gradually worsening. The problem occurs every few minutes. The cough is Non-productive (overall dry, but about once an hour will have productive cough with thick, discolored mucus). Associated symptoms include chills, a fever (subjective), headaches, myalgias, a sore throat (initial, then improved. Cough started after sore throat improved), shortness of breath (mild; still active) and wheezing. Pertinent negatives include no ear congestion, ear pain, nasal congestion, postnasal drip, rhinorrhea or sweats. The symptoms are aggravated by lying down and cold air. He has tried nothing for the symptoms. The treatment provided no relief.     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   Health maintenance examination 10/16/2014   Impaired fasting glucose 10/16/2014   Recurrent herpes labialis 06/30/2014   Cold sore 06/03/2014   Routine general medical examination at a health care facility 10/02/2013   Bradycardia 10/02/2013   Erectile dysfunction 08/27/2012   HTN (hypertension) 11/21/2011   Hyperlipidemia 11/21/2011   Kidney stones 11/21/2011    Allergies:  Allergies  Allergen Reactions   Atenolol  Other (See Comments)    bradycarda- heart rate down to 38 bpm   Demerol Nausea And Vomiting   Medications:  Current Outpatient Medications:    albuterol  (VENTOLIN  HFA) 108 (90 Base) MCG/ACT inhaler, Inhale 1-2 puffs into the lungs every 6 (six) hours as needed., Disp: 8 g, Rfl: 0   benzonatate  (TESSALON ) 100 MG capsule, Take 1 capsule (100 mg total) by mouth 3 (three) times daily as needed., Disp: 30 capsule, Rfl: 0   predniSONE  (DELTASONE ) 20 MG tablet, Take 2 tablets (40 mg total) by mouth daily with breakfast., Disp: 10 tablet, Rfl: 0   promethazine -dextromethorphan (PROMETHAZINE -DM) 6.25-15 MG/5ML syrup, Take 5 mLs by mouth 4 (four) times daily as needed., Disp: 118 mL, Rfl: 0   amiodarone  (PACERONE ) 200 MG tablet, Take 1 tablet (200 mg total) by mouth daily., Disp: 18 tablet, Rfl: 0   amLODipine  (NORVASC ) 10 MG tablet, TAKE 1 TABLET BY MOUTH DAILY, Disp: 90 tablet, Rfl: 3   aspirin  EC 325 MG tablet, Take 1 tablet (325 mg total) by mouth daily., Disp: , Rfl:    atorvastatin  (LIPITOR) 80 MG tablet, TAKE 1 TABLET BY MOUTH DAILY, Disp: 90 tablet, Rfl: 0   cetirizine (ZYRTEC) 10 MG tablet,  Take 10 mg by mouth as needed for allergies., Disp: , Rfl:    dapagliflozin  propanediol (FARXIGA ) 5 MG TABS tablet, Take 1 tablet (5 mg total) by mouth daily before breakfast., Disp: 90 tablet, Rfl: 3   fluticasone  (FLONASE ) 50 MCG/ACT nasal spray, Place 2 sprays into both nostrils daily., Disp: 16 g, Rfl: 1   losartan  (COZAAR ) 25 MG tablet, TAKE 1 TABLET BY MOUTH DAILY, Disp: 90 tablet, Rfl: 0   metoprolol  tartrate (LOPRESSOR ) 25 MG tablet, TAKE 1 TABLET BY MOUTH 2 TIMES A DAY, Disp: 180 tablet, Rfl: 2   omeprazole  (PRILOSEC) 20 MG capsule, TAKE 1 CAPSULE BY MOUTH EVERY MORNING 30 MINUTES BEFORE BREAKFAST, Disp: 90 capsule, Rfl: 3   tadalafil  (CIALIS ) 10 MG tablet, TAKE 1 TABLET BY MOUTH EVERY OTHER DAY AS NEEDED FOR ERECTILE DYSFUNCTION, Disp: 10 tablet, Rfl: 1  Observations/Objective: Patient is well-developed, well-nourished in no acute distress.  Resting comfortably at home.  Head is normocephalic, atraumatic.  No labored breathing.  Speech is clear and coherent with logical content.  Patient is alert and oriented at baseline.    Assessment and Plan: 1. Viral bronchitis (  Primary) - albuterol  (VENTOLIN  HFA) 108 (90 Base) MCG/ACT inhaler; Inhale 1-2 puffs into the lungs every 6 (six) hours as needed.  Dispense: 8 g; Refill: 0 - predniSONE  (DELTASONE ) 20 MG tablet; Take 2 tablets (40 mg total) by mouth daily with breakfast.  Dispense: 10 tablet; Refill: 0 - promethazine -dextromethorphan (PROMETHAZINE -DM) 6.25-15 MG/5ML syrup; Take 5 mLs by mouth 4 (four) times daily as needed.  Dispense: 118 mL; Refill: 0 - benzonatate  (TESSALON ) 100 MG capsule; Take 1 capsule (100 mg total) by mouth 3 (three) times daily as needed.  Dispense: 30 capsule; Refill: 0  - Suspect viral bronchitis - Will treat with Promethazine  DM, Tessalon  perles, Prednisone  and Albuterol  inhaler - Can add Mucinex PLAIN during the daytime  - Push fluids.  - Rest.  - Steam and humidifier can help - Seek in person  evaluation if worsening or symptoms fail to improve    Follow Up Instructions: I discussed the assessment and treatment plan with the patient. The patient was provided an opportunity to ask questions and all were answered. The patient agreed with the plan and demonstrated an understanding of the instructions.  A copy of instructions were sent to the patient via MyChart unless otherwise noted below.    The patient was advised to call back or seek an in-person evaluation if the symptoms worsen or if the condition fails to improve as anticipated.    Delon CHRISTELLA Dickinson, PA-C

## 2024-12-02 ENCOUNTER — Other Ambulatory Visit: Payer: Self-pay | Admitting: Medical

## 2025-01-05 ENCOUNTER — Other Ambulatory Visit: Payer: Self-pay | Admitting: Medical

## 2025-09-17 ENCOUNTER — Ambulatory Visit
